# Patient Record
Sex: Female | Born: 1980
Health system: Southern US, Community
[De-identification: ages and names within clinical notes are randomized; demographics above are authoritative.]

## PROBLEM LIST (undated history)

## (undated) DIAGNOSIS — Z803 Family history of malignant neoplasm of breast: Secondary | ICD-10-CM

## (undated) DIAGNOSIS — F329 Major depressive disorder, single episode, unspecified: Secondary | ICD-10-CM

## (undated) DIAGNOSIS — C50919 Malignant neoplasm of unspecified site of unspecified female breast: Secondary | ICD-10-CM

## (undated) DIAGNOSIS — F32A Depression, unspecified: Secondary | ICD-10-CM

## (undated) DIAGNOSIS — D219 Benign neoplasm of connective and other soft tissue, unspecified: Secondary | ICD-10-CM

## (undated) DIAGNOSIS — F419 Anxiety disorder, unspecified: Secondary | ICD-10-CM

## (undated) DIAGNOSIS — Z8049 Family history of malignant neoplasm of other genital organs: Secondary | ICD-10-CM

## (undated) DIAGNOSIS — Z808 Family history of malignant neoplasm of other organs or systems: Secondary | ICD-10-CM

## (undated) DIAGNOSIS — Q793 Gastroschisis: Secondary | ICD-10-CM

## (undated) HISTORY — PX: ABDOMINAL SURGERY: SHX537

## (undated) HISTORY — DX: Family history of malignant neoplasm of other genital organs: Z80.49

## (undated) HISTORY — DX: Malignant neoplasm of unspecified site of unspecified female breast: C50.919

## (undated) HISTORY — DX: Family history of malignant neoplasm of breast: Z80.3

## (undated) HISTORY — DX: Gastroschisis: Q79.3

## (undated) HISTORY — DX: Benign neoplasm of connective and other soft tissue, unspecified: D21.9

## (undated) HISTORY — PX: ABDOMINAL HYSTERECTOMY: SHX81

## (undated) HISTORY — DX: Family history of malignant neoplasm of other organs or systems: Z80.8

## (undated) HISTORY — PX: OOPHORECTOMY: SHX86

## (undated) HISTORY — DX: Depression, unspecified: F32.A

## (undated) HISTORY — DX: Major depressive disorder, single episode, unspecified: F32.9

## (undated) HISTORY — DX: Anxiety disorder, unspecified: F41.9

---

## 2003-03-05 ENCOUNTER — Ambulatory Visit (HOSPITAL_COMMUNITY): Admission: RE | Admit: 2003-03-05 | Discharge: 2003-03-05 | Payer: Self-pay | Admitting: Neurology

## 2003-03-08 ENCOUNTER — Encounter: Payer: Self-pay | Admitting: Neurology

## 2003-03-08 ENCOUNTER — Encounter: Admission: RE | Admit: 2003-03-08 | Discharge: 2003-03-08 | Payer: Self-pay | Admitting: Neurology

## 2015-04-23 DIAGNOSIS — K589 Irritable bowel syndrome without diarrhea: Secondary | ICD-10-CM | POA: Insufficient documentation

## 2017-05-08 DIAGNOSIS — S93491A Sprain of other ligament of right ankle, initial encounter: Secondary | ICD-10-CM | POA: Insufficient documentation

## 2017-08-12 MED FILL — MONTELUKAST SOD 10 MG TAB: 10 | 90 days supply | Qty: 90 | Fill #0

## 2017-08-12 MED FILL — ZOLPIDEM TARTRATE 5 MG TAB: 5 | 30 days supply | Qty: 30 | Fill #0

## 2017-08-12 MED FILL — CITALOPRAM HBR 40 MG TABLET: 40 | 90 days supply | Qty: 90 | Fill #0

## 2017-09-21 MED FILL — ZOLPIDEM TARTRATE 5 MG TAB: 5 | 30 days supply | Qty: 30 | Fill #1

## 2017-09-28 DIAGNOSIS — H52223 Regular astigmatism, bilateral: Secondary | ICD-10-CM | POA: Diagnosis not present

## 2017-09-28 DIAGNOSIS — H5203 Hypermetropia, bilateral: Secondary | ICD-10-CM | POA: Diagnosis not present

## 2017-10-21 MED FILL — ZOLPIDEM TARTRATE 5 MG TAB: 5 | 30 days supply | Qty: 30 | Fill #2

## 2017-10-28 DIAGNOSIS — Z6830 Body mass index (BMI) 30.0-30.9, adult: Secondary | ICD-10-CM | POA: Diagnosis not present

## 2017-10-28 DIAGNOSIS — E663 Overweight: Secondary | ICD-10-CM | POA: Diagnosis not present

## 2017-11-07 DIAGNOSIS — I10 Essential (primary) hypertension: Secondary | ICD-10-CM | POA: Diagnosis not present

## 2017-11-14 DIAGNOSIS — F5104 Psychophysiologic insomnia: Secondary | ICD-10-CM | POA: Diagnosis not present

## 2017-11-14 DIAGNOSIS — K589 Irritable bowel syndrome without diarrhea: Secondary | ICD-10-CM | POA: Diagnosis not present

## 2017-11-14 DIAGNOSIS — R635 Abnormal weight gain: Secondary | ICD-10-CM | POA: Diagnosis not present

## 2017-11-14 DIAGNOSIS — F411 Generalized anxiety disorder: Secondary | ICD-10-CM | POA: Diagnosis not present

## 2017-11-18 ENCOUNTER — Encounter: Payer: Self-pay | Admitting: Certified Nurse Midwife

## 2017-11-18 ENCOUNTER — Ambulatory Visit (INDEPENDENT_AMBULATORY_CARE_PROVIDER_SITE_OTHER): Payer: 59 | Admitting: Certified Nurse Midwife

## 2017-11-18 ENCOUNTER — Other Ambulatory Visit: Payer: Self-pay

## 2017-11-18 ENCOUNTER — Other Ambulatory Visit (HOSPITAL_COMMUNITY)
Admission: RE | Admit: 2017-11-18 | Discharge: 2017-11-18 | Disposition: A | Discharge status: Home / Self Care | Payer: 59 | Source: Ambulatory Visit | Attending: Certified Nurse Midwife | Admitting: Certified Nurse Midwife

## 2017-11-18 VITALS — BP 118/80 | HR 70 | Resp 16 | Ht 64.75 in | Wt 166.0 lb

## 2017-11-18 DIAGNOSIS — Z124 Encounter for screening for malignant neoplasm of cervix: Secondary | ICD-10-CM | POA: Insufficient documentation

## 2017-11-18 DIAGNOSIS — Z01419 Encounter for gynecological examination (general) (routine) without abnormal findings: Secondary | ICD-10-CM

## 2017-11-18 DIAGNOSIS — Z0000 Encounter for general adult medical examination without abnormal findings: Secondary | ICD-10-CM | POA: Diagnosis not present

## 2017-11-18 DIAGNOSIS — E559 Vitamin D deficiency, unspecified: Secondary | ICD-10-CM | POA: Diagnosis not present

## 2017-11-18 MED FILL — MONTELUKAST SOD 10 MG TAB: 10 | 90 days supply | Qty: 90 | Fill #1

## 2017-11-18 MED FILL — CITALOPRAM HBR 40 MG TABLET: 40 | 90 days supply | Qty: 90 | Fill #1

## 2017-11-18 MED FILL — ZOLPIDEM TARTRATE 5 MG TAB: 5 | 30 days supply | Qty: 30 | Fill #3

## 2017-11-18 NOTE — Patient Instructions (Signed)

## 2017-11-18 NOTE — Progress Notes (Signed)
37 y.o. G4P1001 Married  Caucasian Fe here to establish gyn care and for annual exam. Patient had  Supra- cervical hysterectomy for fibroids. Also later had BSO due pain, no other issues per patient. Sees for asthma, insomnia, anxiety/depression management. On weight loss program now with PCP.Marland Kitchen Would like to have screening labs today also.  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 Eats well and exercises 3-5 times weekly. No other health concerns today.  No LMP recorded. Patient has had a hysterectomy.          Sexually active: Yes.    The current method of family planning is supracervical hysterectomy with BSO for fibroid Exercising: Yes.    cardio Smoker:  no  Health Maintenance: Pap:  2013 or 2014 neg per patient History of Abnormal Pap: no MMG:  none Self Breast exams: yes Colonoscopy: had done BMD:   none TDaP:  Within 10 yrs Shingles: no Pneumonia: no Hep C and HIV: not done Labs: yes   reports that  has never smoked. she has never used smokeless tobacco. She reports that she drinks alcohol. She reports that she does not use drugs.  Past Medical History:  Diagnosis Date  . Anxiety   . Depression   . Fibroid   . Gastroschisis     Past Surgical History:  Procedure Laterality Date  . ABDOMINAL HYSTERECTOMY    . ABDOMINAL SURGERY    . OOPHORECTOMY      Current Outpatient Medications  Medication Sig Dispense Refill  . citalopram (CELEXA) 40 MG tablet Take 1 tablet by mouth daily    . montelukast (SINGULAIR) 10 MG tablet as needed.    . phentermine (ADIPEX-P) 37.5 MG tablet Take 18.75 mg by mouth daily.  0  . zolpidem (AMBIEN) 5 MG tablet TAKE ONE TABLET BY MOUTH AT BEDTIME     No current facility-administered medications for this visit.     Family History  Problem Relation Age of Onset  . Uterine cancer Mother   . Diabetes Mother   . Hypertension Mother   . Thyroid disease Mother   . Skin cancer Father   . Diabetes Maternal Grandmother   . Hypertension  Maternal Grandmother   . Diabetes Paternal Grandfather     ROS:  Pertinent items are noted in HPI.  Otherwise, a comprehensive ROS was negative.  Exam:   BP 118/80   Pulse 70   Resp 16   Ht 5' 4.75" (1.645 m)   Wt 166 lb (75.3 kg)   BMI 27.84 kg/m  Height: 5' 4.75" (164.5 cm) Ht Readings from Last 3 Encounters:  11/18/17 5' 4.75" (1.645 m)    General appearance: alert, cooperative and appears stated age Head: Normocephalic, without obvious abnormality, atraumatic Neck: no adenopathy, supple, symmetrical, trachea midline and thyroid normal to inspection and palpation Lungs: clear to auscultation bilaterally Breasts: normal appearance, no masses or tenderness, No nipple retraction or dimpling, No nipple discharge or bleeding, No axillary or supraclavicular adenopathy Heart: regular rate and rhythm Abdomen: soft, non-tender; no masses,  no organomegaly, numerous scarring for multiple abdominal surgery Extremities: extremities normal, atraumatic, no cyanosis or edema Skin: Skin color, texture, turgor normal. No rashes or lesions Lymph nodes: Cervical, supraclavicular, and axillary nodes normal. No abnormal inguinal nodes palpated Neurologic: Grossly normal   Pelvic: External genitalia:  no lesions              Urethra:  normal appearing urethra with no masses, tenderness or lesions  Bartholin's and Skene's: normal                 Vagina: normal appearing vagina with normal color and discharge, no lesions              Cervix: multiparous appearance, no cervical motion tenderness and no lesions              Pap taken: Yes.   Bimanual Exam:  Uterus:  uterus absent              Adnexa: no mass, fullness, tenderness and ovaries surgically absent               Rectovaginal: Confirms               Anus:  normal sphincter tone, no lesions  Chaperone present: yes  A:  Well Woman with normal exam  Contraception TAH supracervical for fibroids, BSO due to pain,    Gastroschisis surgery as an infant  Anxiety/depression/asthma,insomnia management with PCP  Screening labs  P:   Reviewed health and wellness pertinent to exam  Discussed vaginal changes with just cervix present, questions addressed.  Continue follow with MD as indicate  Labs: CMP,Lipid panel,Vitamin D, TSH,CBC  Pap smear: yes   counseled on breast self exam, mammography screening, feminine hygiene, adequate intake of calcium and vitamin D, diet and exercise  return annually or prn  An After Visit Summary was printed and given to the patient.

## 2017-11-19 LAB — VITAMIN D 25 HYDROXY (VIT D DEFICIENCY, FRACTURES): VIT D 25 HYDROXY: 49.5 ng/mL (ref 30.0–100.0)

## 2017-11-19 LAB — COMPREHENSIVE METABOLIC PANEL
ALBUMIN: 4.7 g/dL (ref 3.5–5.5)
ALK PHOS: 65 IU/L (ref 39–117)
ALT: 32 IU/L (ref 0–32)
AST: 24 IU/L (ref 0–40)
Albumin/Globulin Ratio: 2.4 — ABNORMAL HIGH (ref 1.2–2.2)
BUN / CREAT RATIO: 11 (ref 9–23)
BUN: 7 mg/dL (ref 6–20)
Bilirubin Total: 0.6 mg/dL (ref 0.0–1.2)
CHLORIDE: 98 mmol/L (ref 96–106)
CO2: 24 mmol/L (ref 20–29)
Calcium: 9.2 mg/dL (ref 8.7–10.2)
Creatinine, Ser: 0.63 mg/dL (ref 0.57–1.00)
GFR calc Af Amer: 132 mL/min/{1.73_m2} (ref >59)
GFR calc non Af Amer: 114 mL/min/{1.73_m2} (ref >59)
GLUCOSE: 89 mg/dL (ref 65–99)
Globulin, Total: 2 g/dL (ref 1.5–4.5)
Potassium: 4.1 mmol/L (ref 3.5–5.2)
Sodium: 140 mmol/L (ref 134–144)
Total Protein: 6.7 g/dL (ref 6.0–8.5)

## 2017-11-19 LAB — CBC
HEMATOCRIT: 38 % (ref 34.0–46.6)
HEMOGLOBIN: 13.2 g/dL (ref 11.1–15.9)
MCH: 28.8 pg (ref 26.6–33.0)
MCHC: 34.7 g/dL (ref 31.5–35.7)
MCV: 83 fL (ref 79–97)
Platelets: 251 10*3/uL (ref 150–379)
RBC: 4.59 x10E6/uL (ref 3.77–5.28)
RDW: 13.3 % (ref 12.3–15.4)
WBC: 6.1 10*3/uL (ref 3.4–10.8)

## 2017-11-19 LAB — LIPID PANEL
Chol/HDL Ratio: 3.4 ratio (ref 0.0–4.4)
Cholesterol, Total: 178 mg/dL (ref 100–199)
HDL: 52 mg/dL (ref >39)
LDL Calculated: 109 mg/dL — ABNORMAL HIGH (ref 0–99)
Triglycerides: 83 mg/dL (ref 0–149)
VLDL Cholesterol Cal: 17 mg/dL (ref 5–40)

## 2017-11-19 LAB — TSH: TSH: 0.695 u[IU]/mL (ref 0.450–4.500)

## 2017-11-23 DIAGNOSIS — J180 Bronchopneumonia, unspecified organism: Secondary | ICD-10-CM | POA: Diagnosis not present

## 2017-11-23 DIAGNOSIS — J018 Other acute sinusitis: Secondary | ICD-10-CM | POA: Diagnosis not present

## 2017-11-23 LAB — CYTOLOGY - PAP
Diagnosis: NEGATIVE
HPV: NOT DETECTED

## 2017-12-14 MED FILL — SAXENDA 18 MG/3 ML PEN: 18 | 30 days supply | Qty: 15 | Fill #0

## 2017-12-14 MED FILL — UNIFINE PENTIPS 31GX3/16": 31G X 5 MM | 90 days supply | Qty: 100 | Fill #0

## 2017-12-14 MED FILL — UNIFINE PENTIPS 31GX3/16: 31G X 5 MM | 90 days supply | Qty: 100 | Fill #0

## 2017-12-20 MED FILL — ZOLPIDEM TARTRATE 5 MG TAB: 5 | 30 days supply | Qty: 30 | Fill #0

## 2018-01-23 MED FILL — ZOLPIDEM TARTRATE 5 MG TAB: 5 | 30 days supply | Qty: 30 | Fill #1

## 2018-01-23 MED FILL — SAXENDA 18 MG/3 ML PEN: 18 | 30 days supply | Qty: 15 | Fill #1

## 2018-03-07 MED FILL — SAXENDA 18 MG/3 ML PEN: 18 | 30 days supply | Qty: 15 | Fill #2

## 2018-03-07 MED FILL — MONTELUKAST SOD 10 MG TAB: 10 | 90 days supply | Qty: 90 | Fill #2

## 2018-03-07 MED FILL — CITALOPRAM HBR 40 MG TABLET: 40 | 90 days supply | Qty: 90 | Fill #2

## 2018-03-07 MED FILL — ZOLPIDEM TARTRATE 5 MG TAB: 5 | 30 days supply | Qty: 30 | Fill #2

## 2018-05-15 DIAGNOSIS — F5104 Psychophysiologic insomnia: Secondary | ICD-10-CM | POA: Diagnosis not present

## 2018-05-15 DIAGNOSIS — F418 Other specified anxiety disorders: Secondary | ICD-10-CM | POA: Diagnosis not present

## 2018-05-19 DIAGNOSIS — H903 Sensorineural hearing loss, bilateral: Secondary | ICD-10-CM | POA: Diagnosis not present

## 2018-05-19 DIAGNOSIS — H9313 Tinnitus, bilateral: Secondary | ICD-10-CM | POA: Diagnosis not present

## 2018-05-19 MED FILL — CYCLOBENZAPRINE 10 MG TAB: 10 | 30 days supply | Qty: 60 | Fill #0

## 2018-06-19 DIAGNOSIS — Z6831 Body mass index (BMI) 31.0-31.9, adult: Secondary | ICD-10-CM | POA: Diagnosis not present

## 2018-06-19 DIAGNOSIS — F418 Other specified anxiety disorders: Secondary | ICD-10-CM | POA: Diagnosis not present

## 2018-08-30 DIAGNOSIS — R202 Paresthesia of skin: Secondary | ICD-10-CM | POA: Diagnosis not present

## 2018-08-30 DIAGNOSIS — R5383 Other fatigue: Secondary | ICD-10-CM | POA: Diagnosis not present

## 2018-08-30 DIAGNOSIS — G4733 Obstructive sleep apnea (adult) (pediatric): Secondary | ICD-10-CM | POA: Diagnosis not present

## 2018-08-30 DIAGNOSIS — F331 Major depressive disorder, recurrent, moderate: Secondary | ICD-10-CM | POA: Diagnosis not present

## 2018-09-25 DIAGNOSIS — R202 Paresthesia of skin: Secondary | ICD-10-CM | POA: Diagnosis not present

## 2018-09-25 DIAGNOSIS — G4733 Obstructive sleep apnea (adult) (pediatric): Secondary | ICD-10-CM | POA: Diagnosis not present

## 2018-09-25 DIAGNOSIS — F331 Major depressive disorder, recurrent, moderate: Secondary | ICD-10-CM | POA: Diagnosis not present

## 2018-10-03 DIAGNOSIS — G4733 Obstructive sleep apnea (adult) (pediatric): Secondary | ICD-10-CM | POA: Diagnosis not present

## 2018-10-17 ENCOUNTER — Encounter (INDEPENDENT_AMBULATORY_CARE_PROVIDER_SITE_OTHER): Payer: No Typology Code available for payment source | Admitting: Neurology

## 2018-10-17 ENCOUNTER — Ambulatory Visit: Payer: 59 | Admitting: Neurology

## 2018-10-17 DIAGNOSIS — R200 Anesthesia of skin: Secondary | ICD-10-CM

## 2018-10-17 DIAGNOSIS — R29898 Other symptoms and signs involving the musculoskeletal system: Secondary | ICD-10-CM

## 2018-10-17 DIAGNOSIS — Z0289 Encounter for other administrative examinations: Secondary | ICD-10-CM

## 2018-10-17 HISTORY — DX: Anesthesia of skin: R20.0

## 2018-10-17 HISTORY — DX: Other symptoms and signs involving the musculoskeletal system: R29.898

## 2018-10-17 NOTE — Progress Notes (Signed)
Full Name: Sheri Moore Gender: Female MRN #: [id number] Date of Birth: 1980-10-12    Visit Date: 10/17/2018 09:04 Age: 38 Years 26 Months Old Examining Physician: Arlice Colt, MD  Referring Physician: Rochel Brome, MD     History:  Sheri Moore is a 38 year old woman with a four-month history left arm numbness.  Sometimes numbness is in the entire arm and sometimes it is more to the fourth and fifth fingers.  She also notes weakness especially if she holds up an item for a while, such as a gallon of milk.  Of note, she reports that about 10 years ago she was diagnosed with possible MS based on symptoms and an MRI but was never definitively diagnosed or placed on a disease modifying therapy.  On examination today, she has a Tinel sign at the left elbow going to the fourth and fifth fingers.  Strength was 4+/5 in the triceps, pronator muscles and finger extensors.  Strength was 5/5 elsewhere.  Sensation and reflexes were symmetric.  Nerve conduction studies: The left ulnar motor response was mildly slowed across the elbow with a slight drop in amplitude.  The right ulnar and both median motor responses were normal.  The ulnar F-wave responses were normal.  Bilateral median and ulnar sensory responses had normal peak latencies and amplitudes.  Electromyography:  Needle EMG of select muscles of the left arm and hand was performed.  Motor unit morphology and recruitment was normal in all of the muscles tested.  There was no abnormal spontaneous activity.  Impression: This NCV/EMG study shows the following: 1.   Minimal left ulnar neuropathy across the wrist. 2.   There is no definite evidence of radiculopathy though mild radiculopathies cannot always be detected by EMG.  Sheri Moore A. Sheri Shelling, MD, PhD, FAAN Certified in Neurology, Clinical Neurophysiology, Sleep Medicine, Pain Medicine and Neuroimaging Director, [place] at [facility] [address] [place], [place] [address] [phone]  _________________________________________________________________        [facility]    Nerve / Sites Muscle Latency Ref. Amplitude Ref. Rel Amp Segments Distance Velocity Ref. Area    ms ms mV mV %  cm m/s m/s mVms  R Median - APB     Wrist APB 2.9 ?4.4 9.9 ?4.0 100 Wrist - APB 7   37.3     Upper arm APB 6.6  9.8  99.4 Upper arm - Wrist 21 56 ?49 35.9  L Median - APB     Wrist APB 2.9 ?4.4 11.2 ?4.0 100 Wrist - APB 7   43.9     Upper arm APB 6.8  10.5  93.1 Upper arm - Wrist 21 54 ?49 39.5  R Ulnar - ADM     Wrist ADM 2.7 ?3.3 9.4 ?6.0 100 Wrist - ADM 7   32.3     B.Elbow ADM 5.5  9.3  98.8 B.Elbow - Wrist 17 60 ?49 34.3     A.Elbow ADM 7.5  9.3  100 A.Elbow - B.Elbow 10 49 ?49 35.3         A.Elbow - Wrist      L Ulnar - ADM     Wrist ADM 2.8 ?3.3 9.6 ?6.0 100 Wrist - ADM 7   44.0     B.Elbow ADM 6.2  9.0  93.3 B.Elbow - Wrist 17 49 ?49 43.6     A.Elbow ADM 8.5  7.4  82  A.Elbow - B.Elbow 10 44 ?49 35.8         A.Elbow - Wrist                 SNC    Nerve / Sites Rec. Site Peak Lat Ref.  Amp Ref. Segments Distance    ms ms V V  cm  R Median - Orthodromic (Dig II, Mid palm)     Dig II Wrist 3.2 ?3.4 22 ?10 Dig II - Wrist 13  L Median - Orthodromic (Dig II, Mid palm)     Dig II Wrist 3.3 ?3.4 23 ?10 Dig II - Wrist 13  R Ulnar - Orthodromic, (Dig V, Mid palm)     Dig V Wrist 3.0 ?3.1 6 ?5 Dig V - Wrist 11  L Ulnar - Orthodromic, (Dig V, Mid palm)     Dig V Wrist 3.1 ?3.1 7 ?5 Dig V - Wrist 21              F  Wave    Nerve F Lat Ref.   ms ms  R Ulnar - ADM 27.6 ?32.0  L Ulnar - ADM 28.6 ?32.0         EMG full       EMG Summary Table    Spontaneous MUAP Recruitment  Muscle IA Fib PSW Fasc Other Amp Dur. Poly Pattern  L. Deltoid Normal None None None _______ Normal Normal Normal Normal  L. Triceps brachii Normal None None None _______ Normal Normal Normal Normal   L. Biceps brachii Normal None None None _______ Normal Normal Normal Normal  L. Extensor digitorum communis Normal None None None _______ Normal Normal Normal Normal  L. Flexor carpi ulnaris Normal None None None _______ Normal Normal Normal Normal  L. First dorsal interosseous Normal None None None _______ Normal Normal Normal Normal

## 2018-10-18 ENCOUNTER — Other Ambulatory Visit: Payer: Self-pay | Admitting: Family Medicine

## 2018-10-18 DIAGNOSIS — R200 Anesthesia of skin: Secondary | ICD-10-CM

## 2018-10-18 DIAGNOSIS — R202 Paresthesia of skin: Principal | ICD-10-CM

## 2018-10-22 ENCOUNTER — Other Ambulatory Visit: Payer: 59

## 2018-10-27 ENCOUNTER — Ambulatory Visit
Admission: RE | Admit: 2018-10-27 | Discharge: 2018-10-27 | Disposition: A | Discharge status: Home / Self Care | Payer: No Typology Code available for payment source | Source: Ambulatory Visit | Attending: Family Medicine | Admitting: Family Medicine

## 2018-10-27 DIAGNOSIS — R200 Anesthesia of skin: Secondary | ICD-10-CM

## 2018-10-27 DIAGNOSIS — R202 Paresthesia of skin: Principal | ICD-10-CM

## 2018-10-27 MED ORDER — GADOBENATE DIMEGLUMINE 529 MG/ML IV SOLN
15.0000 mL | Freq: Once | INTRAVENOUS | Status: AC | PRN
  Administered 2018-10-27: 15 mL via INTRAVENOUS

## 2018-10-30 DIAGNOSIS — M6281 Muscle weakness (generalized): Secondary | ICD-10-CM | POA: Diagnosis not present

## 2018-10-30 DIAGNOSIS — R202 Paresthesia of skin: Secondary | ICD-10-CM | POA: Diagnosis not present

## 2018-10-30 DIAGNOSIS — R9402 Abnormal brain scan: Secondary | ICD-10-CM | POA: Diagnosis not present

## 2018-10-30 DIAGNOSIS — F320 Major depressive disorder, single episode, mild: Secondary | ICD-10-CM | POA: Diagnosis not present

## 2018-11-09 ENCOUNTER — Encounter: Payer: Self-pay | Admitting: Neurology

## 2018-11-09 ENCOUNTER — Ambulatory Visit: Payer: No Typology Code available for payment source | Admitting: Neurology

## 2018-11-09 ENCOUNTER — Other Ambulatory Visit: Payer: Self-pay

## 2018-11-09 VITALS — BP 126/90 | HR 70 | Ht 65.0 in | Wt 162.5 lb

## 2018-11-09 DIAGNOSIS — R200 Anesthesia of skin: Secondary | ICD-10-CM | POA: Diagnosis not present

## 2018-11-09 DIAGNOSIS — F418 Other specified anxiety disorders: Secondary | ICD-10-CM | POA: Insufficient documentation

## 2018-11-09 DIAGNOSIS — R9082 White matter disease, unspecified: Secondary | ICD-10-CM | POA: Insufficient documentation

## 2018-11-09 DIAGNOSIS — R5383 Other fatigue: Secondary | ICD-10-CM

## 2018-11-09 MED FILL — TRINTELLIX 20 MG TABLET: 20 | 90 days supply | Qty: 90 | Fill #0

## 2018-11-09 NOTE — Progress Notes (Signed)
[facility]  PATIENT: Sheri Moore DOB: 16-Feb-1981  REFERRING DOCTOR OR PCP: Sheri Brome, MD SOURCE: Patient, notes from Dr. Tobie Moore, notes from Dr. Jacqulynn Moore (weight for his MS center), imaging, study and lab reports, multiple MRI images personally reviewed  _________________________________   HISTORICAL  CHIEF COMPLAINT:  Chief Complaint  Patient presents with  . New Patient (Initial Visit)    RM 45 with husband. Paper referral from Dr. Tobie Moore for MS. Dr. Dellis Moore previously followed her back in 2009 when she had her first MRI for probable MS. Had recent MRI 10/27/18 at [facility] imaging. Can be viewed in EPIC. Shows probable MS, no active lesions.   . Numbness    Having numbness in lips,face, tingling in left arm, fourth and fifth fingers on left hand, tongue and throat.   . Fatigue    Having decreased strength and muscle fatigue in left arm. Fatigue in excessive heat.   . Cognition changes    Unable to focus, unable to remember things, words "get lost between brain and mouth" per pt. She will lose her train of thought mid-sentence.   Marland Kitchen RLS    Reports occasional RLS at night  . Other    Pt also reports constipation, vertigo, moderate depression    HISTORY OF PRESENT ILLNESS:  I had the pleasure seeing your patient, Sheri Moore, at [facility] for neurologic consultation regarding her numbness, fatigue, cognitive changes and abnormal brain MRI.  Sheri Moore is a 38 year old woman who began to experience vertigo and headaches about 2004.    She had a brain MRI performedshowing white matter abnormalities and saw Dr. Jannifer Moore.   An LP was performed and CSF was normal in 2004.    She had a post-LP headache and a blood patch was needed (03/11/2003).  She was referred to Dr. Jacqulynn Moore.       At some point in time a second LP was performed and it was also negative.    She was seen by Dr. Dellis Moore at [facility] between 2006 and 2009 and had a couple of MRI scans  performed.  Besides the vertigo, she also reported a mental fog around that time with reduced focus and attention.   She was not definitively diagnosed with multiple sclerosis.  Around October 2019, she had the onset of lip tingling bilaterally and then mor eof the face and the left arm had tingling.     The arm tingling is constant while the facial tingling is intermittent.   The right arm is sometimes feeling heavy but no tingling/numbness.   Legs are fine.   Her bladder and bowel function are unchanged.   Vision acuity is fine with glasses.   She has symmetric color vision.  She has borderline elevated IOP.      She has had fatigue x many years, worse in the heat (like at ball games) and will sometimes feel too tired to do more activities later in the day.    She is falling asleep most nights and gets 8 hours of sleep most nights.  She has both depression and anxiety that worsened last year.   She was recently switched to Trintellix from Cymbalta and citalopram.     She feels memory is poor.  Focus and attention is poor.   She did not have ADD signs/symptoms as a child or teen.   She feels the cognitive issues have progressed further over the last month.      She has occasionally  headaches, mostly mild to moderate and helped by Tylenol.  Only a few have nausea.   No associated neurologic symptoms or aura.    Last year she had a home sleep study.  By her report, it was normal..  Weight has not changed since that time.  DATA Personally reviewed:  MRI of the brain 10/27/2018 was personally reviewed and compared to a study 11/15/2007 and 02/25/2005.  There are multiple T2/flair hyperintense foci predominantly in the subcortical white matter.  Some IN the deep white matter and a couple are periventricular though none are in the callosal septal fibers.   Compared to MRIs from 11/15/2007 another MRI from 2007, there has been some progression over the last 10 years.  No foci are noted in the brainstem or  cerebellum.  SSEP, VEP 07/05/2003 was normal  Labs 06/10/2003 and 06/12/03:  Anti cardiolipin, Prot C, Prot S, Facotr V Lieden, TPA, AT III, Lyme, ANA, B12, ESR  Notes from Dr. Jacqulynn Moore (and Sheri Moore) 06/10/2003, 07/09/2003, 02/14/2004, 10/02/2004, 04/05/2005, 8/3/20061/30/2007, 04/04/2006, 05/23/2006, 10/25/2007  EMG/NCV 10/17/2018 Impression: This NCV/EMG study shows the following: 1.   Minimal left ulnar neuropathy across the wrist. 2.   There is no definite evidence of radiculopathy though mild radiculopathies cannot always be detected by EMG.  Her grandfather had Wegener's disease and an MI.  Her maternal grandmother had a stroke  REVIEW OF SYSTEMS: Constitutional: No fevers, chills, sweats, or change in appetite.  She has fatigue.  She has restless leg syndrome. Eyes: No visual changes, double vision, eye pain Ear, nose and throat: No hearing loss, ear pain, nasal congestion, sore throat Cardiovascular: No chest pain, palpitations Respiratory: No shortness of breath at rest or with exertion.   No wheezes GastrointestinaI: No nausea, vomiting, diarrhea, abdominal pain, fecal incontinence Genitourinary: No dysuria, urinary retention or frequency.  No nocturia. Musculoskeletal: No neck pain, back pain Integumentary: No rash, pruritus, skin lesions Neurological: as above Psychiatric: No depression at this time.  No anxiety Endocrine: No palpitations, diaphoresis, change in appetite, change in weigh or increased thirst Hematologic/Lymphatic: No anemia, purpura, petechiae. Allergic/Immunologic: No itchy/runny eyes, nasal congestion, recent allergic reactions, rashes  ALLERGIES: No Known Allergies  HOME MEDICATIONS:  Current Outpatient Medications:  .  montelukast (SINGULAIR) 10 MG tablet, as needed., Disp: , Rfl:  .  vortioxetine HBr (TRINTELLIX) 20 MG TABS tablet, Take 20 mg by mouth daily., Disp: , Rfl:  .  zolpidem (AMBIEN) 5 MG tablet, Take 5 mg by mouth as needed. , Disp: , Rfl:    PAST MEDICAL HISTORY: Past Medical History:  Diagnosis Date  . Anxiety   . Depression   . Fibroid   . Gastroschisis     PAST SURGICAL HISTORY: Past Surgical History:  Procedure Laterality Date  . ABDOMINAL HYSTERECTOMY     supracervical hysterectomy  . ABDOMINAL SURGERY    . OOPHORECTOMY      FAMILY HISTORY: Family History  Problem Relation Age of Onset  . Uterine cancer Mother   . Diabetes Mother   . Hypertension Mother   . Thyroid disease Mother   . Skin cancer Father   . Diabetes Maternal Grandmother   . Hypertension Maternal Grandmother   . Diabetes Paternal Grandfather     SOCIAL HISTORY:  Social History   Socioeconomic History  . Marital status: Married    Spouse name: Not on file  . Number of children: 1  . Years of education: College  . Highest education level: Not on file  Occupational  History  . Occupation: [employer]  Social Needs  . Financial resource strain: Not on file  . Food insecurity:    Worry: Not on file    Inability: Not on file  . Transportation needs:    Medical: Not on file    Non-medical: Not on file  Tobacco Use  . Smoking status: Former Smoker    Last attempt to quit: 10/11/2001    Years since quitting: 17.0  . Smokeless tobacco: Never Used  Substance and Sexual Activity  . Alcohol use: Yes    Comment: 1-2 a month  . Drug use: No  . Sexual activity: Yes    Partners: Female    Birth control/protection: Surgical    Comment: supracervical hysterectomy  Lifestyle  . Physical activity:    Days per week: Not on file    Minutes per session: Not on file  . Stress: Not on file  Relationships  . Social connections:    Talks on phone: Not on file    Gets together: Not on file    Attends religious service: Not on file    Active member of club or organization: Not on file    Attends meetings of clubs or organizations: Not on file    Relationship status: Not on file  . Intimate partner violence:    Fear of current  or ex partner: Not on file    Emotionally abused: Not on file    Physically abused: Not on file    Forced sexual activity: Not on file  Other Topics Concern  . Not on file  Social History Narrative   Lives with husband and son   Caffeine use: rarely   Right handed      PHYSICAL EXAM  Vitals:   11/09/18 0902  BP: 126/90  Pulse: 70  Weight: 162 lb 8 oz (73.7 kg)  Height: _0  (1.651 m)    Body mass index is 27.04 kg/m.   General: The patient is well-developed and well-nourished and in no acute distress  Eyes:  Funduscopic exam shows normal optic discs and retinal vessels.  Neck: The neck is supple, no carotid bruits are noted.  The neck is nontender.  Cardiovascular: The heart has a regular rate and rhythm with a normal S1 and S2. There were no murmurs, gallops or rubs. Lungs are clear to auscultation.  Skin: Extremities are without significant edema.  Musculoskeletal:  Back is nontender  Neurologic Exam  Mental status: The patient is alert and oriented x 3 at the time of the examination. The patient has apparent normal recent and remote memory, with an apparently normal attention span and concentration ability.   Speech is normal.  Cranial nerves: Extraocular movements are full. Pupils are equal, round, and reactive to light and accomodation.  Visual fields are full.  Facial symmetry is present. There is reduced left facial sensation to temperature.  .Facial strength is normal.  Trapezius and sternocleidomastoid strength is normal. No dysarthria is noted.  The tongue is midline, and the patient has symmetric elevation of the soft palate. No obvious hearing deficits are noted.  Motor:  Muscle bulk is normal.   Tone is normal. Strength is  5 / 5 in all 4 extremities.   Sensory: Sensory testing shows reduced touch sensation at left hypothenar eminence.   Mild asymmetry to temperature sensation in arms, colder on the left.    Coordination: Cerebellar testing reveals good  finger-nose-finger and heel-to-shin bilaterally.  Gait and station: Station  is normal.   Gait is normal. Tandem gait is normal. Romberg is negative.   Reflexes: Deep tendon reflexes are symmetric and normal bilaterally.   Plantar responses are flexor.    DIAGNOSTIC DATA (LABS, IMAGING, TESTING) - I reviewed patient records, labs, notes, testing and imaging myself where available.  Lab Results  Component Value Date   WBC 6.1 11/18/2017   HGB 13.2 11/18/2017   HCT 38.0 11/18/2017   MCV 83 11/18/2017   PLT 251 11/18/2017      Component Value Date/Time   NA 140 11/18/2017 1702   K 4.1 11/18/2017 1702   CL 98 11/18/2017 1702   CO2 24 11/18/2017 1702   GLUCOSE 89 11/18/2017 1702   BUN 7 11/18/2017 1702   CREATININE 0.63 11/18/2017 1702   CALCIUM 9.2 11/18/2017 1702   PROT 6.7 11/18/2017 1702   ALBUMIN 4.7 11/18/2017 1702   AST 24 11/18/2017 1702   ALT 32 11/18/2017 1702   ALKPHOS 65 11/18/2017 1702   BILITOT 0.6 11/18/2017 1702   GFRNONAA 114 11/18/2017 1702   GFRAA 132 11/18/2017 1702   Lab Results  Component Value Date   CHOL 178 11/18/2017   HDL 52 11/18/2017   LDLCALC 109 (H) 11/18/2017   TRIG 83 11/18/2017   CHOLHDL 3.4 11/18/2017   No results found for: HGBA1C No results found for: VITAMINB12 Lab Results  Component Value Date   TSH 0.695 11/18/2017       ASSESSMENT AND PLAN  Left arm numbness - Plan: MR CERVICAL SPINE WO CONTRAST, ECHOCARDIOGRAM COMPLETE BUBBLE STUDY, Pan-ANCA, Sedimentation rate  White matter abnormality on MRI of brain - Plan: MR CERVICAL SPINE WO CONTRAST, ECHOCARDIOGRAM COMPLETE BUBBLE STUDY, Pan-ANCA, Sedimentation rate  Other fatigue - Plan: Pan-ANCA, Sedimentation rate  Depression with anxiety   In summary, Ms. Lisby is a 38 year old woman with facial and left arm numbness who has multiple T2/flair hyperintense lesions on her brain MRI showing some progression compared to a 2009 MRI.  She also has depression/anxiety, fatigue  and decreased focus/attention.    I had a long conversation with her and her husband about the significance of the MRI findings and her symptoms.  The foci on MRI or more consistent with chronic microvascular ischemic change, sequela of migraine or sequela of cardio-emboli.  MS is less likely.  Therefore, we need to check some additional blood work including ESR and ANCA antibodies and also check an echocardiogram with bubble contrast to determine if there is any right to left shunting through a PFO or ASD or valvular abnormalities.  Additionally, we need to check an MRI of the cervical spine to better assess the persistent left arm numbness.  If she does have foci consistent with demyelination in the cervical spine, the possibility of MS is increased.  I will see her back in 6 weeks to go over those results.  If symptoms have not changed and the evaluation is negative then etiology would be uncertain but we could consider further treatment of symptoms.  Thank you for asking me to see Ms. Hakes for neurologic consultation.  Please let me know if I can be of further assistance with her or other patients in the future.   Richard A. Felecia Shelling, MD, PhD, FAAN Certified in Neurology, Clinical Neurophysiology, Sleep Medicine, Pain Medicine and Neuroimaging Director, [place] at [facility] [address] [place], [place] [address] [phone]

## 2018-11-12 LAB — PAN-ANCA
ANCA Proteinase 3: <3.5 U/mL (ref 0.0–3.5)
Atypical pANCA: <1:20 {titer}
C-ANCA: <1:20 {titer}
Myeloperoxidase Ab: <9 U/mL (ref 0.0–9.0)

## 2018-11-12 LAB — SEDIMENTATION RATE: Sed Rate: 2 mm/hr (ref 0–32)

## 2018-11-13 ENCOUNTER — Telehealth: Payer: Self-pay | Admitting: Neurology

## 2018-11-13 ENCOUNTER — Telehealth: Payer: Self-pay | Admitting: *Deleted

## 2018-11-13 NOTE — Telephone Encounter (Signed)
-----   Message from Britt Bottom, MD sent at 11/12/2018 10:29 AM EST ----- Please let the patient know that the lab work is fine.   We will let her know the results of the echo and MRI after they are performed.

## 2018-11-13 NOTE — Telephone Encounter (Signed)
I called and spoke with patient. Relayed results per Dr. Felecia Shelling note. She verbalized understanding.

## 2018-11-13 NOTE — Telephone Encounter (Signed)
Patient returned my call she is scheduled for 11/15/18 at [facility].   [facility] Josem Kaufmann: [phone] (exp. 11/15/18 to 12/15/18)

## 2018-11-13 NOTE — Telephone Encounter (Signed)
lvm for pt to call me back about schedulng mri  do auth once scheduled

## 2018-11-15 ENCOUNTER — Ambulatory Visit: Payer: No Typology Code available for payment source

## 2018-11-15 DIAGNOSIS — R200 Anesthesia of skin: Secondary | ICD-10-CM

## 2018-11-15 DIAGNOSIS — R9082 White matter disease, unspecified: Secondary | ICD-10-CM | POA: Diagnosis not present

## 2018-11-16 ENCOUNTER — Telehealth: Payer: Self-pay | Admitting: *Deleted

## 2018-11-16 NOTE — Telephone Encounter (Signed)
-----   Message from Britt Bottom, MD sent at 11/15/2018  5:45 PM EST ----- Please let her know that the MRI of the cervical spine was normal.

## 2018-11-16 NOTE — Telephone Encounter (Signed)
Sent pt mychart message about results.

## 2018-11-17 ENCOUNTER — Ambulatory Visit (HOSPITAL_COMMUNITY)
Admission: RE | Admit: 2018-11-17 | Discharge: 2018-11-17 | Disposition: A | Discharge status: Home / Self Care | Payer: No Typology Code available for payment source | Source: Ambulatory Visit | Attending: Neurology | Admitting: Neurology

## 2018-11-17 DIAGNOSIS — Q211 Atrial septal defect: Secondary | ICD-10-CM | POA: Diagnosis not present

## 2018-11-17 DIAGNOSIS — R200 Anesthesia of skin: Secondary | ICD-10-CM

## 2018-11-17 DIAGNOSIS — R9082 White matter disease, unspecified: Secondary | ICD-10-CM | POA: Insufficient documentation

## 2018-11-17 NOTE — Progress Notes (Signed)
  Echocardiogram 2D Echocardiogram has been performed.  Sheri Moore 11/17/2018, 9:01 AM

## 2018-11-20 ENCOUNTER — Telehealth: Payer: Self-pay | Admitting: *Deleted

## 2018-11-20 NOTE — Telephone Encounter (Signed)
-----   Message from Britt Bottom, MD sent at 11/17/2018  3:50 PM EST ----- Please let her know that the echocardiogram was normal.

## 2018-11-22 ENCOUNTER — Other Ambulatory Visit: Payer: Self-pay | Admitting: *Deleted

## 2018-11-22 MED ORDER — ARMODAFINIL 200 MG PO TABS: 1.0000 | ORAL_TABLET | ORAL | 3 refills | Status: DC

## 2018-11-22 MED FILL — ARMODAFINIL 200 MG TABLET: 200 | 30 days supply | Qty: 30 | Fill #0

## 2018-11-24 ENCOUNTER — Other Ambulatory Visit: Payer: Self-pay

## 2018-11-24 ENCOUNTER — Encounter: Payer: Self-pay | Admitting: Certified Nurse Midwife

## 2018-11-24 ENCOUNTER — Ambulatory Visit (INDEPENDENT_AMBULATORY_CARE_PROVIDER_SITE_OTHER): Payer: No Typology Code available for payment source | Admitting: Certified Nurse Midwife

## 2018-11-24 VITALS — BP 120/80 | HR 68 | Resp 16 | Ht 64.75 in | Wt 162.0 lb

## 2018-11-24 DIAGNOSIS — R634 Abnormal weight loss: Secondary | ICD-10-CM

## 2018-11-24 DIAGNOSIS — E559 Vitamin D deficiency, unspecified: Secondary | ICD-10-CM | POA: Diagnosis not present

## 2018-11-24 DIAGNOSIS — Z01419 Encounter for gynecological examination (general) (routine) without abnormal findings: Secondary | ICD-10-CM

## 2018-11-24 DIAGNOSIS — Z0000 Encounter for general adult medical examination without abnormal findings: Secondary | ICD-10-CM | POA: Diagnosis not present

## 2018-11-24 NOTE — Patient Instructions (Signed)

## 2018-11-24 NOTE — Progress Notes (Signed)
38 y.o. [id number] Married  Caucasian Fe here for annual exam. Some occasional hot flashes, no issues. Some vaginal dryness, but lubrication works well. Seeing Dr. Kerman Passey for arm and facial numbness. All work ups negative to this point.  Sees PCP for medication management and labs if needed. Has been working on weight loss for better health. No health issues today.  No LMP recorded. Patient has had a hysterectomy.          Sexually active: Yes.    The current method of family planning is status post hysterectomy.   supracervical Exercising: Yes.    walking Smoker:  no  Review of Systems  Constitutional: Negative.   HENT: Negative.   Eyes: Negative.   Respiratory: Negative.   Cardiovascular: Negative.   Gastrointestinal: Negative.   Genitourinary: Negative.   Musculoskeletal:       Muscle weakness, difficulty with memory or speech  Skin: Negative.   Neurological: Negative.   Endo/Heme/Allergies: Negative.   Psychiatric/Behavioral: Positive for depression.    Health Maintenance: Pap:  11-18-17 neg HPV HR neg History of Abnormal Pap: no MMG:  none Self Breast exams: occ Colonoscopy:  long time ago BMD:   none TDaP:  2013 Shingles: no Pneumonia: no Hep C and HIV: not done Labs: if needed   reports that she quit smoking about 17 years ago. She has never used smokeless tobacco. She reports current alcohol use. She reports that she does not use drugs.  Past Medical History:  Diagnosis Date  . Anxiety   . Depression   . Fibroid   . Gastroschisis     Past Surgical History:  Procedure Laterality Date  . ABDOMINAL HYSTERECTOMY     supracervical hysterectomy  . ABDOMINAL SURGERY    . OOPHORECTOMY      Current Outpatient Medications  Medication Sig Dispense Refill  . Armodafinil 200 MG TABS Take 1 tablet by mouth every morning. 30 tablet 3  . montelukast (SINGULAIR) 10 MG tablet as needed.    . vortioxetine HBr (TRINTELLIX) 20 MG TABS tablet Take 20 mg by mouth daily.    Marland Kitchen  zolpidem (AMBIEN) 5 MG tablet Take 5 mg by mouth as needed.      No current facility-administered medications for this visit.     Family History  Problem Relation Age of Onset  . Uterine cancer Mother   . Diabetes Mother   . Hypertension Mother   . Thyroid disease Mother   . Skin cancer Father   . Diabetes Maternal Grandmother   . Hypertension Maternal Grandmother   . Diabetes Paternal Grandfather     ROS:  Pertinent items are noted in HPI.  Otherwise, a comprehensive ROS was negative.  Exam:   BP 120/80   Pulse 68   Resp 16   Ht 5' 4.75" (1.645 m)   Wt 162 lb (73.5 kg)   BMI 27.17 kg/m  Height: 5' 4.75" (164.5 cm) Ht Readings from Last 3 Encounters:  11/24/18 5' 4.75" (1.645 m)  11/09/18 5\' 5"  (1.651 m)  11/18/17 5' 4.75" (1.645 m)    General appearance: alert, cooperative and appears stated age Head: Normocephalic, without obvious abnormality, atraumatic Neck: no adenopathy, supple, symmetrical, trachea midline and thyroid normal to inspection and palpation Lungs: clear to auscultation bilaterally Breasts: normal appearance, no masses or tenderness, No nipple retraction or dimpling, No nipple discharge or bleeding, No axillary or supraclavicular adenopathy Heart: regular rate and rhythm Abdomen: soft, non-tender; no masses,  no organomegaly Extremities: extremities normal,  atraumatic, no cyanosis or edema Skin: Skin color, texture, turgor normal. No rashes or lesions Lymph nodes: Cervical, supraclavicular, and axillary nodes normal. No abnormal inguinal nodes palpated Neurologic: Grossly normal   Pelvic: External genitalia:  no lesions              Urethra:  normal appearing urethra with no masses, tenderness or lesions              Bartholin's and Skene's: normal                 Vagina: normal appearing vagina with normal color and discharge, no lesions              Cervix: no cervical motion tenderness and no lesions              Pap taken: No. Bimanual Exam:   Uterus:  uterus absent              Adnexa: no mass, fullness, tenderness and adnexa surgically absent               Rectovaginal: Confirms               Anus:  normal sphincter tone, no lesions  Chaperone present: yes  A:  Well Woman with normal exam  S/P TAH supracervical with BSO, fibroids  Vaginal dryness lubrication working well  Hand and facial numbness under evaluation  Screening labs  P:   Reviewed health and wellness pertinent to exam  Aware to advise if vaginal bleeding  Continue follow up with hand and facial numbness with Neurology  Lab: Vitamin D, TSH, Lipid panel, CBC, CMP  Pap smear: no   counseled on breast self exam, mammography screening, feminine hygiene, adequate intake of calcium and vitamin D, diet and exercise  return annually or prn  An After Visit Summary was printed and given to the patient.

## 2018-11-25 LAB — VITAMIN D 25 HYDROXY (VIT D DEFICIENCY, FRACTURES): Vit D, 25-Hydroxy: 31 ng/mL (ref 30.0–100.0)

## 2018-11-25 LAB — COMPREHENSIVE METABOLIC PANEL
ALT: 34 IU/L — ABNORMAL HIGH (ref 0–32)
AST: 21 IU/L (ref 0–40)
Albumin/Globulin Ratio: 2.2 (ref 1.2–2.2)
Albumin: 4.7 g/dL (ref 3.8–4.8)
Alkaline Phosphatase: 72 IU/L (ref 39–117)
BUN/Creatinine Ratio: 14 (ref 9–23)
BUN: 9 mg/dL (ref 6–20)
Bilirubin Total: 0.6 mg/dL (ref 0.0–1.2)
CO2: 27 mmol/L (ref 20–29)
Calcium: 9.6 mg/dL (ref 8.7–10.2)
Chloride: 99 mmol/L (ref 96–106)
Creatinine, Ser: 0.64 mg/dL (ref 0.57–1.00)
GFR calc Af Amer: 130 mL/min/{1.73_m2} (ref >59)
GFR calc non Af Amer: 113 mL/min/{1.73_m2} (ref >59)
GLOBULIN, TOTAL: 2.1 g/dL (ref 1.5–4.5)
Glucose: 88 mg/dL (ref 65–99)
Potassium: 4.3 mmol/L (ref 3.5–5.2)
Sodium: 142 mmol/L (ref 134–144)
Total Protein: 6.8 g/dL (ref 6.0–8.5)

## 2018-11-25 LAB — CBC
Hematocrit: 42.1 % (ref 34.0–46.6)
Hemoglobin: 14.2 g/dL (ref 11.1–15.9)
MCH: 28.9 pg (ref 26.6–33.0)
MCHC: 33.7 g/dL (ref 31.5–35.7)
MCV: 86 fL (ref 79–97)
Platelets: 322 10*3/uL (ref 150–450)
RBC: 4.92 x10E6/uL (ref 3.77–5.28)
RDW: 12.6 % (ref 11.7–15.4)
WBC: 6.2 10*3/uL (ref 3.4–10.8)

## 2018-11-25 LAB — LIPID PANEL
Chol/HDL Ratio: 4.2 ratio (ref 0.0–4.4)
Cholesterol, Total: 212 mg/dL — ABNORMAL HIGH (ref 100–199)
HDL: 50 mg/dL (ref >39)
LDL Calculated: 144 mg/dL — ABNORMAL HIGH (ref 0–99)
Triglycerides: 91 mg/dL (ref 0–149)
VLDL Cholesterol Cal: 18 mg/dL (ref 5–40)

## 2018-11-25 LAB — TSH: TSH: 0.911 u[IU]/mL (ref 0.450–4.500)

## 2018-11-26 ENCOUNTER — Other Ambulatory Visit: Payer: Self-pay | Admitting: Certified Nurse Midwife

## 2018-11-26 DIAGNOSIS — R899 Unspecified abnormal finding in specimens from other organs, systems and tissues: Secondary | ICD-10-CM

## 2018-11-28 ENCOUNTER — Telehealth: Payer: Self-pay

## 2018-11-28 ENCOUNTER — Other Ambulatory Visit: Payer: Self-pay

## 2018-11-28 DIAGNOSIS — R899 Unspecified abnormal finding in specimens from other organs, systems and tissues: Secondary | ICD-10-CM

## 2018-11-28 NOTE — Addendum Note (Signed)
Addended by: Susy Manor on: 11/28/2018 04:10 PM   Modules accepted: Orders

## 2018-11-28 NOTE — Telephone Encounter (Signed)
Patient returned call

## 2018-11-28 NOTE — Progress Notes (Signed)
Lab orders cancelled & future orders placed in a new encounter.

## 2018-11-28 NOTE — Telephone Encounter (Signed)
-----   Message from Regina Eck, CNM sent at 11/26/2018  4:42 PM EST ----- Notify patient her Cholesterol is borderline elevated at 212 normal< 199, triglycerides 91 normal <149 HDL 50 >39 normal, LDL 144 which is elevated <99 normal one year ago 109.  Work on regular exercise, decrease high fat and cholesterol food, increase whole grains, vegetables and lean meat and fish   Recheck in 6 months fasting order placed TSH is normal Vitamin D is borderline normal at 31 Liver, kidney and glucose essentially normal, ALT is slightly elevated at 34 normal <32. Avoid over use of OTC pain relief and alcohol which can affect the liver, recheck in  one month order placed CBC is normal, no anemia

## 2018-11-28 NOTE — Telephone Encounter (Signed)
Left message for call back.

## 2018-11-28 NOTE — Telephone Encounter (Signed)
Patient notified of results as written by provider 

## 2018-11-30 ENCOUNTER — Telehealth: Payer: Self-pay | Admitting: Certified Nurse Midwife

## 2018-11-30 NOTE — Telephone Encounter (Signed)
11/24/18 lab results released to [facility].   Written orders signed by Melvia Heaps, CNM for 1 month ALT and 6 month fasting lipid panel.   Call to patient to confirm fax number provided. Fax ATTN: Rhae Hammock. Advised MyChart results have been released. Patient verbalizes understanding and is agreeable.   Written lab orders faxed. Copy of orders to scan.   Routing to provider for final review. Patient is agreeable to disposition. Will close encounter.

## 2018-11-30 NOTE — Telephone Encounter (Signed)
Patient sent the following correspondence through [facility]. Routing to triage to assist patient with request.  Please make my labs available in my chart and fax the upcoming lab orders to [phone] so I can have our lab girl draw them and fax you. Thanks!

## 2018-12-12 ENCOUNTER — Encounter: Payer: Self-pay | Admitting: Neurology

## 2018-12-12 ENCOUNTER — Ambulatory Visit: Payer: No Typology Code available for payment source | Admitting: Neurology

## 2018-12-12 VITALS — BP 110/70 | HR 69 | Ht 64.75 in | Wt 162.5 lb

## 2018-12-12 DIAGNOSIS — R5383 Other fatigue: Secondary | ICD-10-CM

## 2018-12-12 DIAGNOSIS — R200 Anesthesia of skin: Secondary | ICD-10-CM | POA: Diagnosis not present

## 2018-12-12 DIAGNOSIS — R9082 White matter disease, unspecified: Secondary | ICD-10-CM

## 2018-12-12 NOTE — Progress Notes (Signed)
[facility]  PATIENT: Sheri Moore DOB: 1980/11/03  REFERRING DOCTOR OR PCP: Rochel Brome, MD SOURCE: Patient, notes from Dr. Tobie Poet, notes from Dr. Jacqulynn Cadet (weight for his [facility] center), imaging, study and lab reports, multiple MRI images personally reviewed  _________________________________   HISTORICAL  CHIEF COMPLAINT:  Chief Complaint  Patient presents with  . Follow-up    RM 10 w/ spouse. Last seen 1/40/20. Feels armodafinil has helped with fatigue. Tolerating well, no SE. Still having tingling in left arm, lips, tongue and throat.    HISTORY OF PRESENT ILLNESS:  Sheri Moore is a 38 y.o. woman with numbness, fatigue, cognitive changes and abnormal brain MRI.  Update 12/12/2018: The last visit she was started on armodafinil for fatigue and she feels there is some benefits.  She continues to note tingling in the left arm, lips and throat.   She feels the tingling is about the same.  The tingling is not painful ut is a nuisance.    The tingling is in the 4th and 5th fingers of the left hand.    Tingling is 24/7 without much fluctuations.   She has a left Tinel's sign.    Since the last visit, she had an echocardiogram.  The Echocardiogram 11/17/2018 was essentially normal.  Specifically there was no evidence of PFO or other shunt.  Additionally, 11/15/2018, she had an MRI of the cervical spine that was normal without spinal cord lesions or significant degenerative change.    The MRI of the brain shows white matter foci but they are nonspecific and more consistent with chronic microvascular ischemic change than with demyelination.  She had had white matter abnormalities as early as 2004 and had a normal lumbar puncture at that time.  From 11/09/2018 [name] is a 38 year old woman who began to experience vertigo and headaches about 2004.    She had a brain MRI performedshowing white matter abnormalities and saw Dr. Jannifer Franklin.   An LP was performed and CSF was normal in  2004.    She had a post-LP headache and a blood patch was needed (03/11/2003).  She was referred to Dr. Jacqulynn Cadet.       At some point in time a second LP was performed and it was also negative.    She was seen by Dr. Dellis Filbert at [facility] between 2006 and 2009 and had a couple of MRI scans performed.  Besides the vertigo, she also reported a mental fog around that time with reduced focus and attention.   She was not definitively diagnosed with multiple sclerosis.  Around October 2019, she had the onset of lip tingling bilaterally and then mor eof the face and the left arm had tingling.     The arm tingling is constant while the facial tingling is intermittent.   The right arm is sometimes feeling heavy but no tingling/numbness.   Legs are fine.   Her bladder and bowel function are unchanged.   Vision acuity is fine with glasses.   She has symmetric color vision.  She has borderline elevated IOP.      She has had fatigue x many years, worse in the heat (like at ball games) and will sometimes feel too tired to do more activities later in the day.    She is falling asleep most nights and gets 8 hours of sleep most nights.  She has both depression and anxiety that worsened last year.   She was recently switched to Trintellix from Cymbalta  and citalopram.     She feels memory is poor.  Focus and attention is poor.   She did not have ADD signs/symptoms as a child or teen.   She feels the cognitive issues have progressed further over the last month.      She has occasionally headaches, mostly mild to moderate and helped by Tylenol.  Only a few have nausea.   No associated neurologic symptoms or aura.    Last year she had a home sleep study.  By her report, it was normal..  Weight has not changed since that time.  DATA Personally reviewed:  MRI of the brain 10/27/2018 was personally reviewed and compared to a study 11/15/2007 and 02/25/2005.  There are multiple T2/flair hyperintense foci predominantly in the  subcortical white matter.  Some IN the deep white matter and a couple are periventricular though none are in the callosal septal fibers.   Compared to MRIs from 11/15/2007 another MRI from 2007, there has been some progression over the last 10 years.  No foci are noted in the brainstem or cerebellum.  SSEP, VEP 07/05/2003 was normal  Labs 06/10/2003 and 06/12/03:  Anti cardiolipin, Prot C, Prot S, Facotr V Lieden, TPA, AT III, Lyme, ANA, B12, ESR  Notes from Dr. Jacqulynn Cadet (and NP [facility]) 06/10/2003, 07/09/2003, 02/14/2004, 10/02/2004, 04/05/2005, 8/3/20061/30/2007, 04/04/2006, 05/23/2006, 10/25/2007  EMG/NCV 10/17/2018 Impression: This NCV/EMG study shows the following: 1.   Minimal left ulnar neuropathy across the wrist. 2.   There is no definite evidence of radiculopathy though mild radiculopathies cannot always be detected by EMG.  Her grandfather had Wegener's disease and an MI.  Her maternal grandmother had a stroke  REVIEW OF SYSTEMS: Constitutional: No fevers, chills, sweats, or change in appetite.  She has fatigue.  She has restless leg syndrome. Eyes: No visual changes, double vision, eye pain Ear, nose and throat: No hearing loss, ear pain, nasal congestion, sore throat Cardiovascular: No chest pain, palpitations Respiratory: No shortness of breath at rest or with exertion.   No wheezes GastrointestinaI: No nausea, vomiting, diarrhea, abdominal pain, fecal incontinence Genitourinary: No dysuria, urinary retention or frequency.  No nocturia. Musculoskeletal: No neck pain, back pain Integumentary: No rash, pruritus, skin lesions Neurological: as above Psychiatric: No depression at this time.  No anxiety Endocrine: No palpitations, diaphoresis, change in appetite, change in weigh or increased thirst Hematologic/Lymphatic: No anemia, purpura, petechiae. Allergic/Immunologic: No itchy/runny eyes, nasal congestion, recent allergic reactions, rashes  ALLERGIES: No Known Allergies  HOME  MEDICATIONS:  Current Outpatient Medications:  .  Armodafinil 200 MG TABS, Take 1 tablet by mouth every morning., Disp: 30 tablet, Rfl: 3 .  montelukast (SINGULAIR) 10 MG tablet, as needed., Disp: , Rfl:  .  vortioxetine HBr (TRINTELLIX) 20 MG TABS tablet, Take 20 mg by mouth daily., Disp: , Rfl:  .  zolpidem (AMBIEN) 5 MG tablet, Take 5 mg by mouth as needed. , Disp: , Rfl:   PAST MEDICAL HISTORY: Past Medical History:  Diagnosis Date  . Anxiety   . Depression   . Fibroid   . Gastroschisis     PAST SURGICAL HISTORY: Past Surgical History:  Procedure Laterality Date  . ABDOMINAL HYSTERECTOMY     supracervical hysterectomy  . ABDOMINAL SURGERY    . OOPHORECTOMY      FAMILY HISTORY: Family History  Problem Relation Age of Onset  . Uterine cancer Mother   . Diabetes Mother   . Hypertension Mother   . Thyroid disease Mother   .  Skin cancer Father   . Diabetes Maternal Grandmother   . Hypertension Maternal Grandmother   . Diabetes Paternal Grandfather     SOCIAL HISTORY:  Social History   Socioeconomic History  . Marital status: Married    Spouse name: Not on file  . Number of children: 1  . Years of education: College  . Highest education level: Not on file  Occupational History  . Occupation: [employer]  Social Needs  . Financial resource strain: Not on file  . Food insecurity:    Worry: Not on file    Inability: Not on file  . Transportation needs:    Medical: Not on file    Non-medical: Not on file  Tobacco Use  . Smoking status: Former Smoker    Last attempt to quit: 10/11/2001    Years since quitting: 17.1  . Smokeless tobacco: Never Used  Substance and Sexual Activity  . Alcohol use: Yes    Comment: occ  . Drug use: No  . Sexual activity: Yes    Partners: Female    Birth control/protection: Surgical    Comment: supracervical hysterectomy  Lifestyle  . Physical activity:    Days per week: Not on file    Minutes per session: Not on  file  . Stress: Not on file  Relationships  . Social connections:    Talks on phone: Not on file    Gets together: Not on file    Attends religious service: Not on file    Active member of club or organization: Not on file    Attends meetings of clubs or organizations: Not on file    Relationship status: Not on file  . Intimate partner violence:    Fear of current or ex partner: Not on file    Emotionally abused: Not on file    Physically abused: Not on file    Forced sexual activity: Not on file  Other Topics Concern  . Not on file  Social History Narrative   Lives with husband and son   Caffeine use: rarely   Right handed      PHYSICAL EXAM  Vitals:   12/12/18 1259  BP: 110/70  Pulse: 69  Weight: 162 lb 8 oz (73.7 kg)  Height: 5' 4.75" (1.645 m)    Body mass index is 27.25 kg/m.   General: The patient is well-developed and well-nourished and in no acute distress  Skin: Extremities are without rash or edema  Neurologic Exam  Mental status: The patient is alert and oriented x 3 at the time of the examination. The patient has apparent normal recent and remote memory, with an apparently normal attention span and concentration ability.   Speech is normal.  Cranial nerves: Extraocular movements are full.  Facial strength and sensation (including inside the mouth and tongue) is normal.  Palatal elevation and tongue protrusion midline.  The tongue is midline, and the patient has symmetric elevation of the soft palate. No obvious hearing deficits are noted.  Motor:  Muscle bulk is normal.   Tone is normal. Strength is  5 / 5 in all 4 extremities including the ulnar innervated hand muscles.  Sensory: She has a Tinel sign at the left elbow.  Sensory testing shows reduced touch sensation at the left hypothenar eminence.      Coordination: Finger-nose-finger and heel-to-shin was performed well.  Gait and station: Station is normal.   Gait is normal. Tandem gait is normal.  Romberg is negative.  Reflexes: Deep tendon reflexes are symmetric and normal bilaterally.      DIAGNOSTIC DATA (LABS, IMAGING, TESTING) - I reviewed patient records, labs, notes, testing and imaging myself where available.  Lab Results  Component Value Date   WBC 6.2 11/24/2018   HGB 14.2 11/24/2018   HCT 42.1 11/24/2018   MCV 86 11/24/2018   PLT 322 11/24/2018      Component Value Date/Time   NA 142 11/24/2018 1340   K 4.3 11/24/2018 1340   CL 99 11/24/2018 1340   CO2 27 11/24/2018 1340   GLUCOSE 88 11/24/2018 1340   BUN 9 11/24/2018 1340   CREATININE 0.64 11/24/2018 1340   CALCIUM 9.6 11/24/2018 1340   PROT 6.8 11/24/2018 1340   ALBUMIN 4.7 11/24/2018 1340   AST 21 11/24/2018 1340   ALT 34 (H) 11/24/2018 1340   ALKPHOS 72 11/24/2018 1340   BILITOT 0.6 11/24/2018 1340   GFRNONAA 113 11/24/2018 1340   GFRAA 130 11/24/2018 1340   Lab Results  Component Value Date   CHOL 212 (H) 11/24/2018   HDL 50 11/24/2018   LDLCALC 144 (H) 11/24/2018   TRIG 91 11/24/2018   CHOLHDL 4.2 11/24/2018   No results found for: HGBA1C No results found for: VITAMINB12 Lab Results  Component Value Date   TSH 0.911 11/24/2018       ASSESSMENT AND PLAN  Left arm numbness  White matter abnormality on MRI of brain  Other fatigue  Facial numbness  1.   I reviewed the MRI of the spine in her presence.   There is no evidence of demyelination.  We had a long discussion about the significance of the white matter abnormality seen on the brain MRI.  Of note, changes were first noted on the 2004 MRI and there is only very mild difference between 2009 and 2020.   I believe the most likely represents chronic microvascular ischemic change rather than demyelination.  I do not have a good explanation for the numbness in the lip and mouth.  There were no abnormalities noted on the MRI involving the pons or the left cranial nerves.  The numbness in the hand most likely is due to an ulnar  neuropathy. 2.    She is advised to stay active and exercise as tolerated.   3.    She will return to see me in 8 months or sooner if there are new or worsening neurologic symptoms.  Richard A. Felecia Shelling, MD, PhD, FAAN Certified in Neurology, Clinical Neurophysiology, Sleep Medicine, Pain Medicine and Neuroimaging Director, [facility] [address] [place], [place] [id number] [phone]

## 2018-12-23 MED FILL — ARMODAFINIL 200 MG TABLET: 200 | 30 days supply | Qty: 30 | Fill #1 | Status: TO

## 2018-12-23 MED FILL — ZOLPIDEM TARTRATE 5 MG TAB: 5 | 30 days supply | Qty: 30 | Fill #0 | Status: TO

## 2019-01-17 ENCOUNTER — Telehealth: Payer: Self-pay | Admitting: Obstetrics and Gynecology

## 2019-01-17 NOTE — Telephone Encounter (Signed)
This is Dr. Quincy Simmonds reviewing labs for Sheri Moore while she is out of the office.    Please contact patient regarding her ALT level drawn at [facility] in [place].  The level on 12/29/18 was 24, which is normal. (Result received by fax and is not in Epic at this time but will be scanned in.) It was previously 34 on 11/24/18.   A copy of her cholesterol results was recheck was note received, so I do not know if this was drawn of not.

## 2019-01-18 NOTE — Telephone Encounter (Signed)
Spoke with patient. Message given as seen below from [place]. Patient verbalizes understanding. States that she discussed with Melvia Heaps CNM that she will have her cholesterol level rechecked in 6 months.   Routing to provider and will close encounter.

## 2019-01-19 MED FILL — ZOLPIDEM TARTRATE 5 MG TAB: 5 | 30 days supply | Qty: 30 | Fill #0

## 2019-01-19 MED FILL — ARMODAFINIL 200 MG TABS: 200 | 30 days supply | Qty: 30 | Fill #0

## 2019-01-30 ENCOUNTER — Other Ambulatory Visit: Payer: Self-pay | Admitting: Certified Nurse Midwife

## 2019-01-30 DIAGNOSIS — Z1231 Encounter for screening mammogram for malignant neoplasm of breast: Secondary | ICD-10-CM

## 2019-02-14 MED FILL — TRINTELLIX 20 MG TABLET: 20 | 90 days supply | Qty: 90 | Fill #0

## 2019-02-20 MED FILL — ZOLPIDEM TARTRATE 5 MG TAB: 5 | 30 days supply | Qty: 30 | Fill #1

## 2019-02-20 MED FILL — ARMODAFINIL 200 MG TABS: 200 | 30 days supply | Qty: 30 | Fill #1

## 2019-03-29 ENCOUNTER — Other Ambulatory Visit: Payer: Self-pay | Admitting: Neurology

## 2019-03-29 MED FILL — ZOLPIDEM TARTRATE 5 MG TAB: 5 | 30 days supply | Qty: 30 | Fill #2

## 2019-03-29 MED FILL — ARMODAFINIL 200 MG TABS: 200 | 30 days supply | Qty: 30 | Fill #0

## 2019-04-30 MED FILL — ZOLPIDEM TARTRATE 5 MG TAB: 5 | 30 days supply | Qty: 30 | Fill #3

## 2019-04-30 MED FILL — ARMODAFINIL 200 MG TABS: 200 | 30 days supply | Qty: 30 | Fill #1

## 2019-05-22 ENCOUNTER — Other Ambulatory Visit: Payer: Self-pay | Admitting: Neurology

## 2019-05-22 MED ORDER — ARMODAFINIL 250 MG PO TABS: 250.0000 mg | ORAL_TABLET | Freq: Every day | ORAL | 5 refills | Status: DC

## 2019-05-22 MED FILL — TRINTELLIX 20 MG TABLET: 20 | 90 days supply | Qty: 90 | Fill #0

## 2019-05-24 MED FILL — ARMODAFINIL 250 MG TABLET: 250 | 30 days supply | Qty: 30 | Fill #0

## 2019-06-08 ENCOUNTER — Other Ambulatory Visit: Payer: Self-pay

## 2019-06-08 ENCOUNTER — Ambulatory Visit
Admission: RE | Admit: 2019-06-08 | Discharge: 2019-06-08 | Disposition: A | Discharge status: Home / Self Care | Payer: No Typology Code available for payment source | Source: Ambulatory Visit | Attending: Certified Nurse Midwife | Admitting: Certified Nurse Midwife

## 2019-06-08 DIAGNOSIS — Z1231 Encounter for screening mammogram for malignant neoplasm of breast: Secondary | ICD-10-CM

## 2019-06-20 MED FILL — ARMODAFINIL 250 MG TABLET: 250 | 30 days supply | Qty: 30 | Fill #1

## 2019-06-20 MED FILL — ZOLPIDEM TARTRATE 5 MG TAB: 5 | 30 days supply | Qty: 30 | Fill #4

## 2019-07-24 ENCOUNTER — Telehealth: Payer: Self-pay | Admitting: *Deleted

## 2019-07-24 NOTE — Telephone Encounter (Signed)
Called pt. Advised Dr. Felecia Shelling spoke with PCP about working her in for sooner appt. Scheduled appt for 07/30/2019 at 1:30pm with Dr. Felecia Shelling. Advised her to check in by 1:00pm, wear a mask. Explained check in procedure. She verbalized understanding and appreciation for call.

## 2019-07-30 ENCOUNTER — Encounter: Payer: Self-pay | Admitting: Neurology

## 2019-07-30 ENCOUNTER — Other Ambulatory Visit: Payer: Self-pay

## 2019-07-30 ENCOUNTER — Ambulatory Visit: Payer: No Typology Code available for payment source | Admitting: Neurology

## 2019-07-30 VITALS — BP 148/92 | HR 83 | Temp 97.6°F | Ht 65.0 in | Wt 163.0 lb

## 2019-07-30 DIAGNOSIS — R5383 Other fatigue: Secondary | ICD-10-CM

## 2019-07-30 DIAGNOSIS — F418 Other specified anxiety disorders: Secondary | ICD-10-CM

## 2019-07-30 DIAGNOSIS — R9082 White matter disease, unspecified: Secondary | ICD-10-CM | POA: Diagnosis not present

## 2019-07-30 DIAGNOSIS — R29898 Other symptoms and signs involving the musculoskeletal system: Secondary | ICD-10-CM

## 2019-07-30 DIAGNOSIS — R200 Anesthesia of skin: Secondary | ICD-10-CM

## 2019-07-30 MED ORDER — AMPHETAMINE-DEXTROAMPHET ER 15 MG PO CP24: 15.0000 mg | ORAL_CAPSULE | ORAL | 0 refills | Status: DC

## 2019-07-30 NOTE — Progress Notes (Signed)
[facility]  PATIENT: Sheri Moore DOB: 30-Aug-1981  REFERRING DOCTOR OR PCP: Rochel Brome, MD SOURCE: Patient, notes from Dr. Tobie Poet, notes from Dr. Jacqulynn Cadet (weight for his MS center), imaging, study and lab reports, multiple MRI images personally reviewed  _________________________________   HISTORICAL  CHIEF COMPLAINT:  Chief Complaint  Patient presents with  . Follow-up    pt alone, rm 12    HISTORY OF PRESENT ILLNESS:  Sheri Moore is a 38 y.o. woman with numbness, fatigue, cognitive changes and abnormal brain MRI.  Update 07/30/2019: She is reporting more trouble with her focus and memory and feels she is distractible - brain fog in general.  She is on armodafinil 250 mg.  It seemed to help a little bit at first but even  A higher dose is not helping now.    She did not have ADD when younger.      She also reports her arms feel heavy.  This is more symmetric than previously when the left was affected more than the right..  Gait is fine.   She gets some intermittent dysesthesias but  She sleeps well on Ambien.   She feels refreshed when she wakes up.   She is tired but not sleepy and does not doze off much.    Mood is doing ok in general.    She is on Trintellix (Dr. Tobie Poet writes).   Her ferritin was high and she was found to be a carrier for hemochromatosis.     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  MRI was abnl in 2004 and only minimal progression over the years.  EPWORTH SLEEPINESS SCALE  On a scale of 0 - 3 what is the chance of dozing:  Sitting and Reading:  0 Watching TV:   0 Sitting inactive in a public place: 0 Passenger in car for one hour: 0 Lying down to rest in the afternoon: 3 Sitting and talking to someone: 0 Sitting quietly after lunch:  0 In a car, stopped in traffic:  0  Total (out of 24):   3/24 (normal)   Update 12/12/2018: The last visit she was started  on armodafinil for fatigue and she feels there is some benefits.  She continues to note tingling in the left arm, lips and throat.   She feels the tingling is about the same.  The tingling is not painful ut is a nuisance.    The tingling is in the 4th and 5th fingers of the left hand.    Tingling is 24/7 without much fluctuations.   She has a left Tinel's sign.    Since the last visit, she had an echocardiogram.  The Echocardiogram 11/17/2018 was essentially normal.  Specifically there was no evidence of PFO or other shunt.  Additionally, 11/15/2018, she had an MRI of the cervical spine that was normal without spinal cord lesions or significant degenerative change.    The MRI of the brain shows white matter foci but they are nonspecific and more consistent with chronic microvascular ischemic change than with demyelination.  She had had white matter abnormalities as early as 2004 and had a normal lumbar puncture at that time.  From 11/09/2018 Ms. Donavan is a 38 year old woman who began to experience vertigo and headaches about 2004.    She had a brain MRI performedshowing white matter abnormalities and saw Dr. Jannifer Franklin.   An LP was performed and CSF was normal in  2004.    She had a post-LP headache and a blood patch was needed (03/11/2003).  She was referred to Dr. Jacqulynn Cadet.       At some point in time a second LP was performed and it was also negative.    She was seen by Dr. Dellis Filbert at [facility] between 2006 and 2009 and had a couple of MRI scans performed.  Besides the vertigo, she also reported a mental fog around that time with reduced focus and attention.   She was not definitively diagnosed with multiple sclerosis.  Around October 2019, she had the onset of lip tingling bilaterally and then mor eof the face and the left arm had tingling.     The arm tingling is constant while the facial tingling is intermittent.   The right arm is sometimes feeling heavy but no tingling/numbness.   Legs are fine.   Her  bladder and bowel function are unchanged.   Vision acuity is fine with glasses.   She has symmetric color vision.  She has borderline elevated IOP.      She has had fatigue x many years, worse in the heat (like at ball games) and will sometimes feel too tired to do more activities later in the day.    She is falling asleep most nights and gets 8 hours of sleep most nights.  She has both depression and anxiety that worsened last year.   She was recently switched to Trintellix from Cymbalta and citalopram.     She feels memory is poor.  Focus and attention is poor.   She did not have ADD signs/symptoms as a child or teen.   She feels the cognitive issues have progressed further over the last month.      She has occasionally headaches, mostly mild to moderate and helped by Tylenol.  Only a few have nausea.   No associated neurologic symptoms or aura.    Last year she had a home sleep study.  By her report, it was normal..  Weight has not changed since that time.  DATA Personally reviewed:  MRI of the brain 10/27/2018 was personally reviewed and compared to a study 11/15/2007 and 02/25/2005.  There are multiple T2/flair hyperintense foci predominantly in the subcortical white matter.  Some IN the deep white matter and a couple are periventricular though none are in the callosal septal fibers.   Compared to MRIs from 11/15/2007 another MRI from 2007, there has been some progression over the last 10 years.  No foci are noted in the brainstem or cerebellum.  SSEP, VEP 07/05/2003 was normal  Labs 06/10/2003 and 06/12/03:  Anti cardiolipin, Prot C, Prot S, Facotr V Lieden, TPA, AT III, Lyme, ANA, B12, ESR  Notes from Dr. Jacqulynn Cadet (and NP [facility]) 06/10/2003, 07/09/2003, 02/14/2004, 10/02/2004, 04/05/2005, 8/3/20061/30/2007, 04/04/2006, 05/23/2006, 10/25/2007  EMG/NCV 10/17/2018 Impression: This NCV/EMG study shows the following: 1.   Minimal left ulnar neuropathy across the wrist. 2.   There is no definite evidence of  radiculopathy though mild radiculopathies cannot always be detected by EMG.  Her grandfather had Wegener's disease and an MI.  Her maternal grandmother had a stroke  REVIEW OF SYSTEMS: Constitutional: No fevers, chills, sweats, or change in appetite.  She has fatigue.  She has restless leg syndrome. Eyes: No visual changes, double vision, eye pain Ear, nose and throat: No hearing loss, ear pain, nasal congestion, sore throat Cardiovascular: No chest pain, palpitations Respiratory: No shortness of breath at rest or with  exertion.   No wheezes GastrointestinaI: No nausea, vomiting, diarrhea, abdominal pain, fecal incontinence Genitourinary: No dysuria, urinary retention or frequency.  No nocturia. Musculoskeletal: No neck pain, back pain Integumentary: No rash, pruritus, skin lesions Neurological: as above Psychiatric: No depression at this time.  No anxiety Endocrine: No palpitations, diaphoresis, change in appetite, change in weigh or increased thirst Hematologic/Lymphatic: No anemia, purpura, petechiae. Allergic/Immunologic: No itchy/runny eyes, nasal congestion, recent allergic reactions, rashes  ALLERGIES: No Known Allergies  HOME MEDICATIONS:  Current Outpatient Medications:  .  Armodafinil 250 MG tablet, Take 1 tablet (250 mg total) by mouth daily., Disp: 30 tablet, Rfl: 5 .  busPIRone (BUSPAR) 10 MG tablet, Take 10 mg by mouth 2 (two) times daily., Disp: , Rfl:  .  pseudoephedrine (SUDAFED) 120 MG 12 hr tablet, Take 120 mg by mouth daily as needed for congestion (last taken this morning 07/30/19)., Disp: , Rfl:  .  vortioxetine HBr (TRINTELLIX) 20 MG TABS tablet, Take 20 mg by mouth daily., Disp: , Rfl:  .  zolpidem (AMBIEN) 5 MG tablet, Take 5 mg by mouth as needed. , Disp: , Rfl:  .  amphetamine-dextroamphetamine (ADDERALL XR) 15 MG 24 hr capsule, Take 1 capsule by mouth every morning., Disp: 30 capsule, Rfl: 0  PAST MEDICAL HISTORY: Past Medical History:  Diagnosis  Date  . Anxiety   . Depression   . Fibroid   . Gastroschisis     PAST SURGICAL HISTORY: Past Surgical History:  Procedure Laterality Date  . ABDOMINAL HYSTERECTOMY     supracervical hysterectomy  . ABDOMINAL SURGERY    . OOPHORECTOMY      FAMILY HISTORY: Family History  Problem Relation Age of Onset  . Uterine cancer Mother   . Diabetes Mother   . Hypertension Mother   . Thyroid disease Mother   . Skin cancer Father   . Diabetes Maternal Grandmother   . Hypertension Maternal Grandmother   . Diabetes Paternal Grandfather   . Breast cancer Neg Hx     SOCIAL HISTORY:  Social History   Socioeconomic History  . Marital status: Married    Spouse name: Not on file  . Number of children: 1  . Years of education: College  . Highest education level: Not on file  Occupational History  . Occupation: [employer]  Social Needs  . Financial resource strain: Not on file  . Food insecurity    Worry: Not on file    Inability: Not on file  . Transportation needs    Medical: Not on file    Non-medical: Not on file  Tobacco Use  . Smoking status: Former Smoker    Quit date: 10/11/2001    Years since quitting: 17.8  . Smokeless tobacco: Never Used  Substance and Sexual Activity  . Alcohol use: Yes    Comment: occ  . Drug use: No  . Sexual activity: Yes    Partners: Female    Birth control/protection: Surgical    Comment: supracervical hysterectomy  Lifestyle  . Physical activity    Days per week: Not on file    Minutes per session: Not on file  . Stress: Not on file  Relationships  . Social Herbalist on phone: Not on file    Gets together: Not on file    Attends religious service: Not on file    Active member of club or organization: Not on file    Attends meetings of clubs or organizations: Not on  file    Relationship status: Not on file  . Intimate partner violence    Fear of current or ex partner: Not on file    Emotionally abused: Not on  file    Physically abused: Not on file    Forced sexual activity: Not on file  Other Topics Concern  . Not on file  Social History Narrative   Lives with husband and son   Caffeine use: rarely   Right handed      PHYSICAL EXAM  Vitals:   07/30/19 1321  BP: (!) 148/92  Pulse: 83  Temp: 97.6 F (36.4 C)  Weight: 163 lb (73.9 kg)  Height: 5' 5" (1.651 m)    Body mass index is 27.12 kg/m.   General: The patient is well-developed and well-nourished and in no acute distress  Skin: Extremities are without rash or edema  Neurologic Exam  Mental status: The patient is alert and oriented x 3 at the time of the examination. The patient has apparent normal recent and remote memory, with an apparently normal attention span and concentration ability.   Speech is normal.  Cranial nerves: Extraocular movements are full.  Normal facial strength.  The tongue is midline, and the patient has symmetric elevation of the soft palate. No obvious hearing deficits are noted.  Motor:  Muscle bulk is normal.   Tone is normal. Strength is  5 / 5 in all 4 extremities including the ulnar innervated hand muscles.  Sensory: She has a Tinel sign at the left elbow.  Today, sensation is symmetric.  Coordination: Finger-nose-finger and heel-to-shin was performed well.  Gait and station: Station is normal.   Gait is normal. Tandem gait is normal. Romberg is negative.   Reflexes: Deep tendon reflexes are symmetric and normal bilaterally.      DIAGNOSTIC DATA (LABS, IMAGING, TESTING) - I reviewed patient records, labs, notes, testing and imaging myself where available.  Lab Results  Component Value Date   WBC 6.2 11/24/2018   HGB 14.2 11/24/2018   HCT 42.1 11/24/2018   MCV 86 11/24/2018   PLT 322 11/24/2018      Component Value Date/Time   NA 142 11/24/2018 1340   K 4.3 11/24/2018 1340   CL 99 11/24/2018 1340   CO2 27 11/24/2018 1340   GLUCOSE 88 11/24/2018 1340   BUN 9 11/24/2018 1340    CREATININE 0.64 11/24/2018 1340   CALCIUM 9.6 11/24/2018 1340   PROT 6.8 11/24/2018 1340   ALBUMIN 4.7 11/24/2018 1340   AST 21 11/24/2018 1340   ALT 34 (H) 11/24/2018 1340   ALKPHOS 72 11/24/2018 1340   BILITOT 0.6 11/24/2018 1340   GFRNONAA 113 11/24/2018 1340   GFRAA 130 11/24/2018 1340   Lab Results  Component Value Date   CHOL 212 (H) 11/24/2018   HDL 50 11/24/2018   LDLCALC 144 (H) 11/24/2018   TRIG 91 11/24/2018   CHOLHDL 4.2 11/24/2018   No results found for: HGBA1C No results found for: VITAMINB12 Lab Results  Component Value Date   TSH 0.911 11/24/2018       ASSESSMENT AND PLAN  White matter abnormality on MRI of brain  Other fatigue  Depression with anxiety  Left arm numbness  Left arm weakness  1.  We discussed the MRIs.  She does have multiple T2/flair hyperintense foci but they are small round and subcortical, more consistent with chronic microvascular ischemic change or sequela of migraine than to MS.  Additionally, there has been only  minimal progression over many years which would be atypical for MS.  Additionally, the lumbar puncture x2 more than a decade ago were negative for oligoclonal bands  2.    Her main problem is fatigue and attention deficit.  Her mood seems to be doing better and she is sleeping well.  She snores but has never been noted to have gasping or pauses and she does not have excessive sleepiness.  Therefore significant OSA would be unlikely.  I will have her try Adderall XR in the hope that that helps her symptomatically.  If she does not get any improvement would also consider referral for neurocognitive testing 3.   She is advised to stay active and exercise as tolerated.   4.    She will return to see me in 6 months or sooner if there are new or worsening neurologic symptoms.  Richard A. Felecia Shelling, MD, PhD, FAAN Certified in Neurology, Clinical Neurophysiology, Sleep Medicine, Pain Medicine and Neuroimaging Director, [facility] [address] [place], [place] [address] [phone]

## 2019-08-16 ENCOUNTER — Ambulatory Visit: Payer: No Typology Code available for payment source | Admitting: Neurology

## 2019-08-24 ENCOUNTER — Other Ambulatory Visit: Payer: Self-pay

## 2019-08-28 MED ORDER — AMPHETAMINE-DEXTROAMPHET ER 15 MG PO CP24: 15.0000 mg | ORAL_CAPSULE | ORAL | 0 refills | Status: DC

## 2019-08-28 MED FILL — ARMODAFINIL 200 MG TABS: 200 | 30 days supply | Qty: 30 | Fill #2

## 2019-09-03 MED FILL — ADDERALL XR 20 MG CAP SA: 20 | 30 days supply | Qty: 30 | Fill #0

## 2019-09-03 MED FILL — TRINTELLIX 20 MG TABLET: 20 | 90 days supply | Qty: 90 | Fill #0

## 2019-09-04 MED FILL — busPIRone HCL 10 MG TABS: 10 | 30 days supply | Qty: 60 | Fill #0

## 2019-10-03 MED FILL — ADDERALL XR 20 MG CAP SA: 20 | 30 days supply | Qty: 30 | Fill #0

## 2019-11-01 MED FILL — ZOLPIDEM TARTRATE 5 MG TAB: 5 | 90 days supply | Qty: 90 | Fill #0

## 2019-11-13 ENCOUNTER — Other Ambulatory Visit: Payer: Self-pay | Admitting: Family Medicine

## 2019-11-13 MED ORDER — BUSPIRONE HCL 10 MG PO TABS: 10.0000 mg | ORAL_TABLET | Freq: Two times a day (BID) | ORAL | 1 refills | Status: DC

## 2019-11-13 MED ORDER — AMPHETAMINE-DEXTROAMPHET ER 15 MG PO CP24: 15.0000 mg | ORAL_CAPSULE | ORAL | 0 refills | Status: DC

## 2019-11-14 MED FILL — ADDERALL XR 15 MG CAP SA: 15 | 30 days supply | Qty: 30 | Fill #0

## 2019-11-14 MED FILL — busPIRone HCL 10 MG TABS: 10 | 90 days supply | Qty: 180 | Fill #0

## 2019-11-22 ENCOUNTER — Other Ambulatory Visit: Payer: Self-pay | Admitting: Family Medicine

## 2019-11-22 MED ORDER — AMPHETAMINE-DEXTROAMPHETAMINE 20 MG PO TABS: 20.0000 mg | ORAL_TABLET | Freq: Every day | ORAL | 0 refills | Status: DC

## 2019-11-22 MED FILL — DEXTROAMP-AMPHETAMIN 20 MG: 20 | 30 days supply | Qty: 30 | Fill #0

## 2019-11-28 ENCOUNTER — Other Ambulatory Visit: Payer: Self-pay

## 2019-11-30 ENCOUNTER — Encounter: Payer: Self-pay | Admitting: Certified Nurse Midwife

## 2019-11-30 ENCOUNTER — Other Ambulatory Visit: Payer: Self-pay

## 2019-11-30 ENCOUNTER — Ambulatory Visit (INDEPENDENT_AMBULATORY_CARE_PROVIDER_SITE_OTHER): Payer: No Typology Code available for payment source | Admitting: Certified Nurse Midwife

## 2019-11-30 VITALS — BP 118/78 | HR 72 | Temp 98.7°F | Resp 16 | Ht 64.75 in | Wt 165.0 lb

## 2019-11-30 DIAGNOSIS — Z01419 Encounter for gynecological examination (general) (routine) without abnormal findings: Secondary | ICD-10-CM

## 2019-11-30 NOTE — Progress Notes (Signed)
39 y.o. G52P1001 Married  Caucasian Fe here for annual exam. Vaginal dryness better and uses vaginal lubrication as needed. Facial and left arm numbness improved with Neurology follow up. Sees PCP for medication management of Adderall, Ambien, and Buspar, working well. Getting exercise in gym and trying to eat well.  No LMP recorded. Patient has had a hysterectomy.          Sexually active: Yes.    The current method of family planning is hysterectomy (supracervical).    Exercising: Yes.    gym Smoker:  no  Review of Systems  Constitutional: Negative.   HENT: Negative.   Eyes: Negative.   Respiratory: Negative.   Cardiovascular: Negative.   Gastrointestinal: Negative.   Genitourinary: Negative.   Musculoskeletal: Negative.   Skin: Negative.   Neurological: Negative.   Endo/Heme/Allergies: Negative.   Psychiatric/Behavioral: Negative.     Health Maintenance: Pap:  11-18-17 neg HPV HR neg History of Abnormal Pap: no MMG:  06-11-2019 category b density birads 1:neg Self Breast exams: no Colonoscopy:  none BMD:   none TDaP:  2013 Shingles: no Pneumonia: no Hep C and HIV: not done Labs: if needed, PCP does   reports that she quit smoking about 18 years ago. She has never used smokeless tobacco. She reports current alcohol use. She reports that she does not use drugs.  Past Medical History:  Diagnosis Date  . Anxiety   . Depression   . Fibroid   . Gastroschisis     Past Surgical History:  Procedure Laterality Date  . ABDOMINAL HYSTERECTOMY     supracervical hysterectomy  . ABDOMINAL SURGERY    . OOPHORECTOMY      Current Outpatient Medications  Medication Sig Dispense Refill  . amphetamine-dextroamphetamine (ADDERALL) 20 MG tablet Take 1 tablet (20 mg total) by mouth daily. 30 tablet 0  . busPIRone (BUSPAR) 10 MG tablet Take 1 tablet (10 mg total) by mouth 2 (two) times daily. 180 tablet 1  . vortioxetine HBr (TRINTELLIX) 20 MG TABS tablet Take 20 mg by mouth daily.     Marland Kitchen zolpidem (AMBIEN) 5 MG tablet Take 5 mg by mouth as needed.      No current facility-administered medications for this visit.    Family History  Problem Relation Age of Onset  . Uterine cancer Mother   . Diabetes Mother   . Hypertension Mother   . Thyroid disease Mother   . Skin cancer Father   . Diabetes Maternal Grandmother   . Hypertension Maternal Grandmother   . Diabetes Paternal Grandfather   . Breast cancer Neg Hx     ROS:  Pertinent items are noted in HPI.  Otherwise, a comprehensive ROS was negative.  Exam:   BP 118/78   Pulse 72   Temp 98.7 F (37.1 C) (Skin)   Resp 16   Ht 5' 4.75" (1.645 m)   Wt 165 lb (74.8 kg)   BMI 27.67 kg/m  Height: 5' 4.75" (164.5 cm) Ht Readings from Last 3 Encounters:  11/30/19 5' 4.75" (1.645 m)  07/30/19 5\' 5"  (1.651 m)  12/12/18 5' 4.75" (1.645 m)    General appearance: alert, cooperative and appears stated age Head: Normocephalic, without obvious abnormality, atraumatic Neck: no adenopathy, supple, symmetrical, trachea midline and thyroid normal to inspection and palpation Lungs: clear to auscultation bilaterally Breasts: normal appearance, no masses or tenderness, No nipple retraction or dimpling, No nipple discharge or bleeding, No axillary or supraclavicular adenopathy Heart: regular rate and rhythm Abdomen: soft,  non-tender; no masses,  no organomegaly Extremities: extremities normal, atraumatic, no cyanosis or edema Skin: Skin color, texture, turgor normal. No rashes or lesions Lymph nodes: Cervical, supraclavicular, and axillary nodes normal. No abnormal inguinal nodes palpated Neurologic: Grossly normal   Pelvic: External genitalia:  no lesions              Urethra:  normal appearing urethra with no masses, tenderness or lesions              Bartholin's and Skene's: normal                 Vagina: normal appearing vagina with normal color and discharge, no lesions              Cervix: no cervical motion  tenderness, no lesions and supracervical              Pap taken: No. Bimanual Exam:  Uterus:  uterus absent              Adnexa: no mass, fullness, tenderness adnexa surgically absent               Rectovaginal: Confirms               Anus:  normal sphincter tone, no lesions  Chaperone present: yes  A:  Well Woman with normal exam  S/P TAH supracervical, with bilateral oophorectomy due to fibroids and pain.   Maternal history of uterine cancer(mother)  Anxiety/depression management with MD  P:   Reviewed health and wellness pertinent to exam  Aware to advise if vaginal dryness issues or bleeding  Continue follow up with PCp as indicated  Pap smear: no  counseled on breast self exam, mammography screening, feminine hygiene, adequate intake of calcium and vitamin D, diet and exercise  return annually or prn  An After Visit Summary was printed and given to the patient.

## 2019-11-30 NOTE — Patient Instructions (Signed)

## 2019-12-18 ENCOUNTER — Other Ambulatory Visit: Payer: Self-pay

## 2019-12-18 MED ORDER — AMPHETAMINE-DEXTROAMPHETAMINE 20 MG PO TABS: 20.0000 mg | ORAL_TABLET | Freq: Every day | ORAL | 0 refills | Status: DC

## 2019-12-18 MED ORDER — VORTIOXETINE HBR 20 MG PO TABS: 20.0000 mg | ORAL_TABLET | Freq: Every day | ORAL | 1 refills | Status: DC

## 2019-12-18 MED FILL — TRINTELLIX 20 MG TABLET: 20 | 90 days supply | Qty: 90 | Fill #0

## 2019-12-19 MED FILL — AMPHETAMINE-DEXTROAMPHETAMI: 20 | 30 days supply | Qty: 30 | Fill #0

## 2019-12-27 ENCOUNTER — Ambulatory Visit (INDEPENDENT_AMBULATORY_CARE_PROVIDER_SITE_OTHER): Payer: No Typology Code available for payment source | Admitting: Family Medicine

## 2019-12-27 VITALS — BP 126/76 | HR 87 | Temp 97.7°F | Ht 65.0 in | Wt 166.2 lb

## 2019-12-27 DIAGNOSIS — F988 Other specified behavioral and emotional disorders with onset usually occurring in childhood and adolescence: Secondary | ICD-10-CM

## 2019-12-27 DIAGNOSIS — F411 Generalized anxiety disorder: Secondary | ICD-10-CM

## 2019-12-27 DIAGNOSIS — R400 Somnolence: Secondary | ICD-10-CM

## 2019-12-27 MED ORDER — AMPHETAMINE-DEXTROAMPHET ER 20 MG PO CP24: 20.0000 mg | ORAL_CAPSULE | ORAL | 0 refills | Status: DC

## 2019-12-27 MED ORDER — BUSPIRONE HCL 15 MG PO TABS: 15.0000 mg | ORAL_TABLET | Freq: Two times a day (BID) | ORAL | 0 refills | Status: DC

## 2019-12-27 MED FILL — ADDERALL XR 20 MG CAP SA: 20 | 30 days supply | Qty: 30 | Fill #0

## 2019-12-27 NOTE — Progress Notes (Signed)
Established Patient Office Visit  Subjective:  Patient ID: Sheri Moore, female    DOB: [birth year]  Age: 39 y.o. MRN: [id number]  CC:  Chief Complaint  Patient presents with  . Difficulty concentrating    Would like to discuss possibly increasing her adderall    HPI Sheri Moore presents with increased anxiety and irritability. No increased depression. Dr. Felecia Shelling started adderal and increased dose to 20 mg once daily. Feels like it was working until changed from capsule to tablet recently.   Past Medical History:  Diagnosis Date  . Anxiety   . Depression   . Fibroid   . Gastroschisis     Past Surgical History:  Procedure Laterality Date  . ABDOMINAL HYSTERECTOMY     supracervical hysterectomy  . ABDOMINAL SURGERY    . OOPHORECTOMY      Family History  Problem Relation Age of Onset  . Uterine cancer Mother   . Diabetes Mother   . Hypertension Mother   . Thyroid disease Mother   . Skin cancer Father   . Diabetes Maternal Grandmother   . Hypertension Maternal Grandmother   . Diabetes Paternal Grandfather   . Breast cancer Neg Hx     Social History   Socioeconomic History  . Marital status: Married    Spouse name: Not on file  . Number of children: 1  . Years of education: College  . Highest education level: Not on file  Occupational History  . Occupation: [employer]  Tobacco Use  . Smoking status: Former Smoker    Quit date: 10/11/2001    Years since quitting: 18.2  . Smokeless tobacco: Never Used  Substance and Sexual Activity  . Alcohol use: Yes    Comment: occ  . Drug use: No  . Sexual activity: Yes    Partners: Female    Birth control/protection: Surgical    Comment: supracervical hysterectomy  Other Topics Concern  . Not on file  Social History Narrative   Lives with husband and son   Caffeine use: rarely   Right handed    Social Determinants of Health   Financial Resource Strain:   . Difficulty of Paying Living Expenses:    Food Insecurity:   . Worried About Charity fundraiser in the Last Year:   . Arboriculturist in the Last Year:   Transportation Needs:   . Film/video editor (Medical):   Marland Kitchen Lack of Transportation (Non-Medical):   Physical Activity:   . Days of Exercise per Week:   . Minutes of Exercise per Session:   Stress:   . Feeling of Stress :   Social Connections:   . Frequency of Communication with Friends and Family:   . Frequency of Social Gatherings with Friends and Family:   . Attends Religious Services:   . Active Member of Clubs or Organizations:   . Attends Archivist Meetings:   Marland Kitchen Marital Status:   Intimate Partner Violence:   . Fear of Current or Ex-Partner:   . Emotionally Abused:   Marland Kitchen Physically Abused:   . Sexually Abused:     Outpatient Medications Prior to Visit  Medication Sig Dispense Refill  . LORazepam (ATIVAN) 0.5 MG tablet Take 0.5 mg by mouth every 8 (eight) hours.    . vortioxetine HBr (TRINTELLIX) 20 MG TABS tablet Take 1 tablet (20 mg total) by mouth daily. 90 tablet 1  . zolpidem (AMBIEN) 5 MG tablet Take 5 mg by  mouth as needed.     Marland Kitchen amphetamine-dextroamphetamine (ADDERALL) 20 MG tablet Take 1 tablet (20 mg total) by mouth daily. 30 tablet 0  . busPIRone (BUSPAR) 10 MG tablet Take 1 tablet (10 mg total) by mouth 2 (two) times daily. 180 tablet 1   No facility-administered medications prior to visit.    No Known Allergies  ROS Review of Systems  Constitutional: Negative for chills, fatigue and fever.  HENT: Negative for congestion, ear pain, rhinorrhea and sore throat.   Respiratory: Negative for cough and shortness of breath.   Cardiovascular: Negative for chest pain.  Gastrointestinal: Negative for abdominal pain, constipation, diarrhea, nausea and vomiting.  Genitourinary: Negative for dysuria and urgency.  Musculoskeletal: Negative for back pain and myalgias.  Neurological: Positive for dizziness. Negative for weakness,  light-headedness and headaches.  Psychiatric/Behavioral: Positive for agitation, decreased concentration and sleep disturbance. Negative for dysphoric mood. The patient is nervous/anxious.        Forgetful      Objective:    Physical Exam  Constitutional: She appears well-developed and well-nourished.  Cardiovascular: Normal rate, regular rhythm and normal heart sounds.  Pulmonary/Chest: Effort normal and breath sounds normal.  Neurological: She is alert.  Psychiatric: Her behavior is normal.  Appears anxious and depressed.    BP 126/76 (BP Location: Right Arm, Patient Position: Sitting)   Pulse 87   Temp 97.7 F (36.5 C)   Ht 5\' 5"  (1.651 m)   Wt 166 lb 3.2 oz (75.4 kg)   SpO2 97%   BMI 27.66 kg/m  Wt Readings from Last 3 Encounters:  12/27/19 166 lb 3.2 oz (75.4 kg)  11/30/19 165 lb (74.8 kg)  07/30/19 163 lb (73.9 kg)     Health Maintenance Due  Topic Date Due  . HIV Screening  Never done  . TETANUS/TDAP  Never done    There are no preventive care reminders to display for this patient.  Lab Results  Component Value Date   TSH 0.911 11/24/2018   Lab Results  Component Value Date   WBC 6.2 11/24/2018   HGB 14.2 11/24/2018   HCT 42.1 11/24/2018   MCV 86 11/24/2018   PLT 322 11/24/2018   Lab Results  Component Value Date   NA 142 11/24/2018   K 4.3 11/24/2018   CO2 27 11/24/2018   GLUCOSE 88 11/24/2018   BUN 9 11/24/2018   CREATININE 0.64 11/24/2018   BILITOT 0.6 11/24/2018   ALKPHOS 72 11/24/2018   AST 21 11/24/2018   ALT 34 (H) 11/24/2018   PROT 6.8 11/24/2018   ALBUMIN 4.7 11/24/2018   CALCIUM 9.6 11/24/2018   Lab Results  Component Value Date   CHOL 212 (H) 11/24/2018   Lab Results  Component Value Date   HDL 50 11/24/2018   Lab Results  Component Value Date   LDLCALC 144 (H) 11/24/2018   Lab Results  Component Value Date   TRIG 91 11/24/2018   Lab Results  Component Value Date   CHOLHDL 4.2 11/24/2018   No results found for:  HGBA1C    Assessment & Plan:  1. Somnolence, daytime Continue adderal at current dose of 20 mg once daily in am, but ordered Brand Name only to see if this would make a difference. If not will, consider increase dose.   2. Attention deficit disorder (ADD) without hyperactivity See above.   3. GAD (generalized anxiety disorder) Increase buspirone to 15 mg one twice a day,.    Meds ordered this encounter  Medications  . amphetamine-dextroamphetamine (ADDERALL XR) 20 MG 24 hr capsule    Sig: Take 1 capsule (20 mg total) by mouth every morning.    Dispense:  30 capsule    Refill:  0    Brand name only.  . busPIRone (BUSPAR) 15 MG tablet    Sig: Take 1 tablet (15 mg total) by mouth 2 (two) times daily.    Dispense:  180 tablet    Refill:  0    Follow-up: Return if symptoms worsen or fail to improve.    Rochel Brome, MD

## 2019-12-28 ENCOUNTER — Encounter: Payer: Self-pay | Admitting: Certified Nurse Midwife

## 2020-01-09 ENCOUNTER — Encounter: Payer: Self-pay | Admitting: Family Medicine

## 2020-01-09 DIAGNOSIS — F988 Other specified behavioral and emotional disorders with onset usually occurring in childhood and adolescence: Secondary | ICD-10-CM | POA: Insufficient documentation

## 2020-01-09 DIAGNOSIS — R400 Somnolence: Secondary | ICD-10-CM | POA: Insufficient documentation

## 2020-01-09 DIAGNOSIS — F411 Generalized anxiety disorder: Secondary | ICD-10-CM | POA: Insufficient documentation

## 2020-01-28 ENCOUNTER — Encounter: Payer: Self-pay | Admitting: Family Medicine

## 2020-01-28 ENCOUNTER — Ambulatory Visit: Payer: No Typology Code available for payment source | Admitting: Family Medicine

## 2020-01-28 ENCOUNTER — Other Ambulatory Visit: Payer: Self-pay

## 2020-01-28 VITALS — BP 132/86 | HR 82 | Temp 97.6°F | Ht 65.0 in | Wt 171.8 lb

## 2020-01-28 DIAGNOSIS — R5383 Other fatigue: Secondary | ICD-10-CM | POA: Diagnosis not present

## 2020-01-28 DIAGNOSIS — F418 Other specified anxiety disorders: Secondary | ICD-10-CM | POA: Diagnosis not present

## 2020-01-28 DIAGNOSIS — R9082 White matter disease, unspecified: Secondary | ICD-10-CM

## 2020-01-28 NOTE — Progress Notes (Signed)
I have read the note, and I agree with the clinical assessment and plan.  Tamecia Mcdougald A. Jane Broughton, MD, PhD, FAAN Certified in Neurology, Clinical Neurophysiology, Sleep Medicine, Pain Medicine and Neuroimaging  [facility] [address], [address] [place], [place] [address] [phone]  

## 2020-01-28 NOTE — Patient Instructions (Signed)
Continue current treatment plan   Consider testing for sleep apnea.   Consider neurocognitive testing   Work on healthy lifestyle habits with low glycemic diet and regular exercise. Stay well hydrated.   Follow up as needed   Sleep Apnea Sleep apnea affects breathing during sleep. It causes breathing to stop for a short time or to become shallow. It can also increase the risk of:  Heart attack.  Stroke.  Being very overweight (obese).  Diabetes.  Heart failure.  Irregular heartbeat. The goal of treatment is to help you breathe normally again. What are the causes? There are three kinds of sleep apnea:  Obstructive sleep apnea. This is caused by a blocked or collapsed airway.  Central sleep apnea. This happens when the brain does not send the right signals to the muscles that control breathing.  Mixed sleep apnea. This is a combination of obstructive and central sleep apnea. The most common cause of this condition is a collapsed or blocked airway. This can happen if:  Your throat muscles are too relaxed.  Your tongue and tonsils are too large.  You are overweight.  Your airway is too small. What increases the risk?  Being overweight.  Smoking.  Having a small airway.  Being older.  Being female.  Drinking alcohol.  Taking medicines to calm yourself (sedatives or tranquilizers).  Having family members with the condition. What are the signs or symptoms?  Trouble staying asleep.  Being sleepy or tired during the day.  Getting angry a lot.  Loud snoring.  Headaches in the morning.  Not being able to focus your mind (concentrate).  Forgetting things.  Less interest in sex.  Mood swings.  Personality changes.  Feelings of sadness (depression).  Waking up a lot during the night to pee (urinate).  Dry mouth.  Sore throat. How is this diagnosed?  Your medical history.  A physical exam.  A test that is done when you are sleeping  (sleep study). The test is most often done in a sleep lab but may also be done at home. How is this treated?   Sleeping on your side.  Using a medicine to get rid of mucus in your nose (decongestant).  Avoiding the use of alcohol, medicines to help you relax, or certain pain medicines (narcotics).  Losing weight, if needed.  Changing your diet.  Not smoking.  Using a machine to open your airway while you sleep, such as: ? An oral appliance. This is a mouthpiece that shifts your lower jaw forward. ? A CPAP device. This device blows air through a mask when you breathe out (exhale). ? An EPAP device. This has valves that you put in each nostril. ? A BPAP device. This device blows air through a mask when you breathe in (inhale) and breathe out.  Having surgery if other treatments do not work. It is important to get treatment for sleep apnea. Without treatment, it can lead to:  High blood pressure.  Coronary artery disease.  In men, not being able to have an erection (impotence).  Reduced thinking ability. Follow these instructions at home: Lifestyle  Make changes that your doctor recommends.  Eat a healthy diet.  Lose weight if needed.  Avoid alcohol, medicines to help you relax, and some pain medicines.  Do not use any products that contain nicotine or tobacco, such as cigarettes, e-cigarettes, and chewing tobacco. If you need help quitting, ask your doctor. General instructions  Take over-the-counter and prescription medicines only  as told by your doctor.  If you were given a machine to use while you sleep, use it only as told by your doctor.  If you are having surgery, make sure to tell your doctor you have sleep apnea. You may need to bring your device with you.  Keep all follow-up visits as told by your doctor. This is important. Contact a doctor if:  The machine that you were given to use during sleep bothers you or does not seem to be working.  You do not  get better.  You get worse. Get help right away if:  Your chest hurts.  You have trouble breathing in enough air.  You have an uncomfortable feeling in your back, arms, or stomach.  You have trouble talking.  One side of your body feels weak.  A part of your face is hanging down. These symptoms may be an emergency. Do not wait to see if the symptoms will go away. Get medical help right away. Call your local emergency services (911 in the [place] Do not drive yourself to the hospital. Summary  This condition affects breathing during sleep.  The most common cause is a collapsed or blocked airway.  The goal of treatment is to help you breathe normally while you sleep. This information is not intended to replace advice given to you by your health care provider. Make sure you discuss any questions you have with your health care provider. Document Revised: 07/14/2018 Document Reviewed: 05/23/2018 Elsevier Patient Education  [place].

## 2020-01-28 NOTE — Progress Notes (Signed)
PATIENT: Sheri Moore DOB: 08-Nov-1980  REASON FOR VISIT: follow up HISTORY FROM: patient  Chief Complaint  Patient presents with  . Follow-up    Rm go over MRI results.      HISTORY OF PRESENT ILLNESS: Today 01/28/20 Sheri Moore is a 39 y.o. female here today for follow up. She had an abnormal MRI in 10/2018 showing progression of white matter changes. Patient has had LPx2 in the past with no oligoclonal bands and no clinical progression for over a decade. MS diagnosis thought to be less likely. She has continued to see [place] for concerns of fatigue and cognitive changes. She has tried and failed armodafinil. She was started on Adderall XR 20mg  daily in October. She feels that this has helped with fatigue and brain fog. PCP has continue refills and also continues Trintellix and Buspar. She feels that mood is fairly stable. She does continue to have intermittent vertigo and brain fog. 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 She feels that it is difficult for her to remember details associated with patents. She gives an example of calling a patient with labs. She asked that he come in for additional workup. When the patient arrived to the office, later in the day, she could not remember what labs he needed. She does use memory compensation strategies like keeping notes of important details. She is able to complete tasks at work without difficulty. She has a active lifestyle.   She does feel that she sleeps well on Ambien. She does snore and was recently told by her husband that she will occasionally gasp for air. This does not seem to be a persistent concern. She has had a HST that showed mild sleep apnea. She has not used CPAP in the past. She denies excessive weight gain. She does occasionally have morning headaches. She does wake with dry mouth on occasion. She denies excessive daytime sleepiness. No naps. Previous ESS was 3/24.   HISTORY: (copied from Dr Garth Bigness note on  07/30/2019)  Sheri Moore is a 40 y.o. woman with numbness, fatigue, cognitive changes and abnormal brain MRI.  Update 07/30/2019: She is reporting more trouble with her focus and memory and feels she is distractible - brain fog in general.  She is on armodafinil 250 mg.  It seemed to help a little bit at first but even  A higher dose is not helping now.    She did not have ADD when younger.      She also reports her arms feel heavy.  This is more symmetric than previously when the left was affected more than the right..  Gait is fine.   She gets some intermittent dysesthesias but  She sleeps well on Ambien.   She feels refreshed when she wakes up.   She is tired but not sleepy and does not doze off much.    Mood is doing ok in general.    She is on Trintellix (Dr. Tobie Poet writes).   Her ferritin was high and she was found to be a carrier for hemochromatosis.     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  MRI was abnl in 2004 and only minimal progression over the years.  EPWORTH SLEEPINESS SCALE  On a scale of 0 - 3 what is the chance of dozing:  Sitting and Reading:  0 Watching TV:                                      0 Sitting inactive in a public place:       0 Passenger in car for one hour:          0 Lying down to rest in the afternoon:  3 Sitting and talking to someone:         0 Sitting quietly after lunch:                  0 In a car, stopped in traffic:                 0  Total (out of 24):   3/24 (normal)    REVIEW OF SYSTEMS: Out of a complete 14 system review of symptoms, the patient complains only of the following symptoms, brain fog, depression, anxiety, fatigue and all other reviewed systems are negative.  ALLERGIES: No Known Allergies  HOME MEDICATIONS: Outpatient Medications Prior to Visit  Medication Sig Dispense Refill  . amphetamine-dextroamphetamine (ADDERALL  XR) 20 MG 24 hr capsule Take 1 capsule (20 mg total) by mouth every morning. 30 capsule 0  . busPIRone (BUSPAR) 15 MG tablet Take 1 tablet (15 mg total) by mouth 2 (two) times daily. 180 tablet 0  . LORazepam (ATIVAN) 0.5 MG tablet Take 0.5 mg by mouth every 8 (eight) hours.    . vortioxetine HBr (TRINTELLIX) 20 MG TABS tablet Take 1 tablet (20 mg total) by mouth daily. 90 tablet 1  . zolpidem (AMBIEN) 5 MG tablet Take 5 mg by mouth as needed.      No facility-administered medications prior to visit.    PAST MEDICAL HISTORY: Past Medical History:  Diagnosis Date  . Anxiety   . Depression   . Fibroid   . Gastroschisis     PAST SURGICAL HISTORY: Past Surgical History:  Procedure Laterality Date  . ABDOMINAL HYSTERECTOMY     supracervical hysterectomy  . ABDOMINAL SURGERY    . OOPHORECTOMY      FAMILY HISTORY: Family History  Problem Relation Age of Onset  . Uterine cancer Mother   . Diabetes Mother   . Hypertension Mother   . Thyroid disease Mother   . Skin cancer Father   . Diabetes Maternal Grandmother   . Hypertension Maternal Grandmother   . Diabetes Paternal Grandfather   . Breast cancer Neg Hx     SOCIAL HISTORY: Social History   Socioeconomic History  . Marital status: Married    Spouse name: Not on file  . Number of children: 1  . Years of education: College  . Highest education level: Not on file  Occupational History  . Occupation: [employer]  Tobacco Use  . Smoking status: Former Smoker    Quit date: 10/11/2001    Years since quitting: 18.3  . Smokeless tobacco: Never Used  Substance and Sexual Activity  . Alcohol use: Yes    Comment: occ  . Drug use: No  . Sexual activity: Yes    Partners: Female    Birth control/protection: Surgical    Comment: supracervical hysterectomy  Other Topics Concern  . Not on file  Social History Narrative   Lives with husband and son   Caffeine use: rarely   Right handed    Social Determinants of  Health  Financial Resource Strain:   . Difficulty of Paying Living Expenses:   Food Insecurity:   . Worried About Charity fundraiser in the Last Year:   . Arboriculturist in the Last Year:   Transportation Needs:   . Film/video editor (Medical):   Marland Kitchen Lack of Transportation (Non-Medical):   Physical Activity:   . Days of Exercise per Week:   . Minutes of Exercise per Session:   Stress:   . Feeling of Stress :   Social Connections:   . Frequency of Communication with Friends and Family:   . Frequency of Social Gatherings with Friends and Family:   . Attends Religious Services:   . Active Member of Clubs or Organizations:   . Attends Archivist Meetings:   Marland Kitchen Marital Status:   Intimate Partner Violence:   . Fear of Current or Ex-Partner:   . Emotionally Abused:   Marland Kitchen Physically Abused:   . Sexually Abused:       PHYSICAL EXAM  Vitals:   01/28/20 1239  BP: 132/86  Pulse: 82  Temp: 97.6 F (36.4 C)  Weight: 171 lb 12.8 oz (77.9 kg)  Height: 5\' 5"  (1.651 m)   Body mass index is 28.59 kg/m.  Generalized: Well developed, in no acute distress  Cardiology: normal rate and rhythm, no murmur noted Respiratory: clear to auscultation bilaterally  Neurological examination  Mentation: Alert oriented to time, place, history taking. Follows all commands speech and language fluent Cranial nerve II-XII: Pupils were equal round reactive to light. Extraocular movements were full, visual field were full on confrontational test. Facial sensation and strength were normal. Uvula tongue midline. Head turning and shoulder shrug  were normal and symmetric. Motor: The motor testing reveals 5 over 5 strength of all 4 extremities. Good symmetric motor tone is noted throughout.  Sensory: Sensory testing is intact to soft touch on all 4 extremities. No evidence of extinction is noted.  Coordination: Cerebellar testing reveals good finger-nose-finger and heel-to-shin bilaterally.   Gait and station: Gait is normal.   DIAGNOSTIC DATA (LABS, IMAGING, TESTING) - I reviewed patient records, labs, notes, testing and imaging myself where available.  No flowsheet data found.   Lab Results  Component Value Date   WBC 6.2 11/24/2018   HGB 14.2 11/24/2018   HCT 42.1 11/24/2018   MCV 86 11/24/2018   PLT 322 11/24/2018      Component Value Date/Time   NA 142 11/24/2018 1340   K 4.3 11/24/2018 1340   CL 99 11/24/2018 1340   CO2 27 11/24/2018 1340   GLUCOSE 88 11/24/2018 1340   BUN 9 11/24/2018 1340   CREATININE 0.64 11/24/2018 1340   CALCIUM 9.6 11/24/2018 1340   PROT 6.8 11/24/2018 1340   ALBUMIN 4.7 11/24/2018 1340   AST 21 11/24/2018 1340   ALT 34 (H) 11/24/2018 1340   ALKPHOS 72 11/24/2018 1340   BILITOT 0.6 11/24/2018 1340   GFRNONAA 113 11/24/2018 1340   GFRAA 130 11/24/2018 1340   Lab Results  Component Value Date   CHOL 212 (H) 11/24/2018   HDL 50 11/24/2018   LDLCALC 144 (H) 11/24/2018   TRIG 91 11/24/2018   CHOLHDL 4.2 11/24/2018   No results found for: HGBA1C No results found for: VITAMINB12 Lab Results  Component Value Date   TSH 0.911 11/24/2018     ASSESSMENT AND PLAN 39 y.o. year old female  has a past medical history of Anxiety, Depression, Fibroid, and Gastroschisis. here  with     ICD-10-CM   1. White matter abnormality on MRI of brain  R90.82   2. Other fatigue  R53.83   3. Depression with anxiety  F41.8     [name] reports some improvement in fatigue an inattention since starting Adderall XR 20mg  daily. She also continues Trintellix and Buspar. She feels that mood is fairly stable. PCP continues prescriptions. We have discussed intermittent concerns of vertigo. Symptoms could be related to vestibular migraines, although not clear at this time. No vision changes, persistent headaches, changes in gait or bladder/bowel habits. No numbness or tingling. We have discussed possible sleep apnea. She reports having a previous home sleep  study that indicated mild obstructive sleep apnea.  CPAP therapy was not initiated.  Weight has been stable since.  Epworth Sleepiness Scale was normal at last visit.  I have provided her with additional educational materials related to sleep apnea.  She will monitor symptoms closely call with any request to repeat sleep study.  We have also discussed considering neurocognitive testing.  Neuro exam is intact I do not suspect neurodegenerative causes of memory loss, however, could consider neurocognitive testing for reassurance.  She will continue close follow-up with primary care.  She will follow-up with me or Dr. Ladonna Snide as needed.  She verbalizes understanding and agreement with this plan.    No orders of the defined types were placed in this encounter.    No orders of the defined types were placed in this encounter.     I spent 25 minutes with the patient. 50% of this time was spent counseling and educating patient on plan of care and medications.    Debbora Presto, FNP-C 01/28/2020, 12:50 PM [facility] [address], [place], [place] [address] [phone]

## 2020-01-30 ENCOUNTER — Other Ambulatory Visit: Payer: Self-pay

## 2020-01-30 NOTE — Progress Notes (Unsigned)
refill 

## 2020-02-01 ENCOUNTER — Other Ambulatory Visit: Payer: Self-pay

## 2020-02-04 ENCOUNTER — Other Ambulatory Visit: Payer: Self-pay

## 2020-02-04 MED ORDER — ZOLPIDEM TARTRATE 5 MG PO TABS: 5.0000 mg | ORAL_TABLET | ORAL | 5 refills | Status: DC | PRN

## 2020-02-04 MED ORDER — AMPHETAMINE-DEXTROAMPHET ER 20 MG PO CP24: 20.0000 mg | ORAL_CAPSULE | ORAL | 0 refills | Status: DC

## 2020-02-04 MED FILL — ZOLPIDEM TARTRATE 5 MG TAB: 5 | 30 days supply | Qty: 30 | Fill #0

## 2020-02-04 MED FILL — ADDERALL XR 20 MG CAP SA: 20 | 30 days supply | Qty: 30 | Fill #0

## 2020-02-21 ENCOUNTER — Other Ambulatory Visit: Payer: Self-pay | Admitting: *Deleted

## 2020-02-21 ENCOUNTER — Other Ambulatory Visit: Payer: Self-pay | Admitting: Family Medicine

## 2020-02-21 DIAGNOSIS — Z1231 Encounter for screening mammogram for malignant neoplasm of breast: Secondary | ICD-10-CM

## 2020-03-05 ENCOUNTER — Other Ambulatory Visit: Payer: Self-pay | Admitting: Family Medicine

## 2020-03-05 MED ORDER — AMPHETAMINE-DEXTROAMPHET ER 20 MG PO CP24: 20.0000 mg | ORAL_CAPSULE | ORAL | 0 refills | Status: DC

## 2020-03-05 MED FILL — ADDERALL XR 20 MG CAP SA: 20 | 30 days supply | Qty: 30 | Fill #0

## 2020-03-05 MED FILL — ZOLPIDEM TARTRATE 5 MG TAB: 5 | 30 days supply | Qty: 30 | Fill #1

## 2020-05-27 ENCOUNTER — Other Ambulatory Visit: Payer: Self-pay | Admitting: Family Medicine

## 2020-05-27 DIAGNOSIS — Z0001 Encounter for general adult medical examination with abnormal findings: Secondary | ICD-10-CM

## 2020-05-27 DIAGNOSIS — R5383 Other fatigue: Secondary | ICD-10-CM

## 2020-05-28 ENCOUNTER — Other Ambulatory Visit: Payer: Self-pay

## 2020-05-28 ENCOUNTER — Ambulatory Visit (INDEPENDENT_AMBULATORY_CARE_PROVIDER_SITE_OTHER): Payer: No Typology Code available for payment source | Admitting: Family Medicine

## 2020-05-28 ENCOUNTER — Encounter: Payer: Self-pay | Admitting: Family Medicine

## 2020-05-28 VITALS — BP 138/90 | HR 85 | Temp 97.7°F | Ht 65.0 in | Wt 176.4 lb

## 2020-05-28 DIAGNOSIS — F411 Generalized anxiety disorder: Secondary | ICD-10-CM

## 2020-05-28 DIAGNOSIS — F988 Other specified behavioral and emotional disorders with onset usually occurring in childhood and adolescence: Secondary | ICD-10-CM | POA: Diagnosis not present

## 2020-05-28 DIAGNOSIS — E663 Overweight: Secondary | ICD-10-CM | POA: Diagnosis not present

## 2020-05-28 DIAGNOSIS — Z6829 Body mass index (BMI) 29.0-29.9, adult: Secondary | ICD-10-CM

## 2020-05-28 DIAGNOSIS — E782 Mixed hyperlipidemia: Secondary | ICD-10-CM | POA: Diagnosis not present

## 2020-05-28 LAB — COMPREHENSIVE METABOLIC PANEL
ALT: 45 IU/L — ABNORMAL HIGH (ref 0–32)
AST: 31 IU/L (ref 0–40)
Albumin/Globulin Ratio: 1.8 (ref 1.2–2.2)
Albumin: 4.2 g/dL (ref 3.8–4.8)
Alkaline Phosphatase: 88 IU/L (ref 48–121)
BUN/Creatinine Ratio: 23 (ref 9–23)
BUN: 16 mg/dL (ref 6–24)
Bilirubin Total: 0.4 mg/dL (ref 0.0–1.2)
CO2: 26 mmol/L (ref 20–29)
Calcium: 9.3 mg/dL (ref 8.7–10.2)
Chloride: 101 mmol/L (ref 96–106)
Creatinine, Ser: 0.71 mg/dL (ref 0.57–1.00)
GFR calc Af Amer: 123 mL/min/{1.73_m2} (ref >59)
GFR calc non Af Amer: 107 mL/min/{1.73_m2} (ref >59)
Globulin, Total: 2.3 g/dL (ref 1.5–4.5)
Glucose: 104 mg/dL — ABNORMAL HIGH (ref 65–99)
Potassium: 4.6 mmol/L (ref 3.5–5.2)
Sodium: 139 mmol/L (ref 134–144)
Total Protein: 6.5 g/dL (ref 6.0–8.5)

## 2020-05-28 LAB — LIPID PANEL
Chol/HDL Ratio: 4.1 ratio (ref 0.0–4.4)
Cholesterol, Total: 211 mg/dL — ABNORMAL HIGH (ref 100–199)
HDL: 51 mg/dL (ref >39)
LDL Chol Calc (NIH): 127 mg/dL — ABNORMAL HIGH (ref 0–99)
Triglycerides: 185 mg/dL — ABNORMAL HIGH (ref 0–149)
VLDL Cholesterol Cal: 33 mg/dL (ref 5–40)

## 2020-05-28 LAB — CBC WITH DIFFERENTIAL/PLATELET
Basophils Absolute: 0 10*3/uL (ref 0.0–0.2)
Basos: 1 %
EOS (ABSOLUTE): 0.1 10*3/uL (ref 0.0–0.4)
Eos: 2 %
Hematocrit: 42 % (ref 34.0–46.6)
Hemoglobin: 14.5 g/dL (ref 11.1–15.9)
Immature Grans (Abs): 0 10*3/uL (ref 0.0–0.1)
Immature Granulocytes: 0 %
Lymphocytes Absolute: 1.6 10*3/uL (ref 0.7–3.1)
Lymphs: 24 %
MCH: 30 pg (ref 26.6–33.0)
MCHC: 34.5 g/dL (ref 31.5–35.7)
MCV: 87 fL (ref 79–97)
Monocytes Absolute: 0.4 10*3/uL (ref 0.1–0.9)
Monocytes: 6 %
Neutrophils Absolute: 4.6 10*3/uL (ref 1.4–7.0)
Neutrophils: 67 %
Platelets: 278 10*3/uL (ref 150–450)
RBC: 4.83 x10E6/uL (ref 3.77–5.28)
RDW: 12.7 % (ref 11.7–15.4)
WBC: 6.7 10*3/uL (ref 3.4–10.8)

## 2020-05-28 LAB — CARDIOVASCULAR RISK ASSESSMENT

## 2020-05-28 LAB — TSH: TSH: 2.46 u[IU]/mL (ref 0.450–4.500)

## 2020-05-28 MED ORDER — LORAZEPAM 0.5 MG PO TABS: 0.5000 mg | ORAL_TABLET | Freq: Three times a day (TID) | ORAL | 1 refills | Status: DC

## 2020-05-28 MED ORDER — ADDERALL XR 20 MG PO CP24: 20.0000 mg | ORAL_CAPSULE | Freq: Every day | ORAL | 0 refills | Status: DC

## 2020-05-28 MED FILL — LORazepam 0.5 MG TABS: 0.5 | 10 days supply | Qty: 30 | Fill #0

## 2020-05-28 MED FILL — ADDERALL XR 20 MG CAP SA: 20 | 30 days supply | Qty: 30 | Fill #0

## 2020-05-28 NOTE — Progress Notes (Signed)
Established Patient Office Visit  Subjective:  Patient ID: Sheri Moore, female    DOB: [birth year]  Age: 39 y.o. MRN: [id number]  CC:  Chief Complaint  Patient presents with  . ADD  . Anxiety    HPI Sheri Moore presents for a follow up on anxiety.   GAD: Off trintellix x 1 1/2 months. She weaned off. No anxiety or depression.   Insomnia: Patient complains of having "brain fog" which is due to her not being able to sleep. She has been using melatonin drops vs ambien which seems to help some. She averages 7 hours of sleep on a "good night" but roughly 3 hours on a "bad night." takes [place] a couple times a week.   Hyperlipidemia.  Patient had recent blood work which showed her triglycerides were 185 and her LDL was 127.  She has fallen off her diet and exercise routine.  She wishes to continue work on diet and exercise versus trying a medication.   Past Medical History:  Diagnosis Date  . Anxiety   . Depression   . Fibroid   . Gastroschisis     Past Surgical History:  Procedure Laterality Date  . ABDOMINAL HYSTERECTOMY     supracervical hysterectomy  . ABDOMINAL SURGERY    . OOPHORECTOMY      Family History  Problem Relation Age of Onset  . Uterine cancer Mother   . Diabetes Mother   . Hypertension Mother   . Thyroid disease Mother   . Skin cancer Father   . Subarachnoid hemorrhage Father   . Diabetes Maternal Grandmother   . Hypertension Maternal Grandmother   . Diabetes Paternal Grandfather   . Breast cancer Neg Hx     Social History   Socioeconomic History  . Marital status: Married    Spouse name: Not on file  . Number of children: 1  . Years of education: College  . Highest education level: Not on file  Occupational History  . Occupation: [employer]  Tobacco Use  . Smoking status: Former Smoker    Quit date: 10/11/2001    Years since quitting: 18.6  . Smokeless tobacco: Never Used  Substance and Sexual Activity  . Alcohol use:  Yes    Comment: occ  . Drug use: No  . Sexual activity: Yes    Partners: Female    Birth control/protection: Surgical    Comment: supracervical hysterectomy  Other Topics Concern  . Not on file  Social History Narrative   Lives with husband and son   Caffeine use: rarely   Right handed    Social Determinants of Health   Financial Resource Strain:   . Difficulty of Paying Living Expenses: Not on file  Food Insecurity:   . Worried About Charity fundraiser in the Last Year: Not on file  . Ran Out of Food in the Last Year: Not on file  Transportation Needs:   . Lack of Transportation (Medical): Not on file  . Lack of Transportation (Non-Medical): Not on file  Physical Activity:   . Days of Exercise per Week: Not on file  . Minutes of Exercise per Session: Not on file  Stress:   . Feeling of Stress : Not on file  Social Connections:   . Frequency of Communication with Friends and Family: Not on file  . Frequency of Social Gatherings with Friends and Family: Not on file  . Attends Religious Services: Not on file  . Active  Member of Clubs or Organizations: Not on file  . Attends Archivist Meetings: Not on file  . Marital Status: Not on file  Intimate Partner Violence:   . Fear of Current or Ex-Partner: Not on file  . Emotionally Abused: Not on file  . Physically Abused: Not on file  . Sexually Abused: Not on file    Outpatient Medications Prior to Visit  Medication Sig Dispense Refill  . zolpidem (AMBIEN) 5 MG tablet Take 1 tablet (5 mg total) by mouth as needed. 30 tablet 5  . amphetamine-dextroamphetamine (ADDERALL XR) 20 MG 24 hr capsule Take 1 capsule (20 mg total) by mouth every morning. 30 capsule 0  . busPIRone (BUSPAR) 15 MG tablet Take 1 tablet (15 mg total) by mouth 2 (two) times daily. 180 tablet 0  . LORazepam (ATIVAN) 0.5 MG tablet Take 0.5 mg by mouth every 8 (eight) hours.    . vortioxetine HBr (TRINTELLIX) 20 MG TABS tablet Take 1 tablet (20 mg  total) by mouth daily. 90 tablet 1   No facility-administered medications prior to visit.    No Known Allergies  ROS Review of Systems  Constitutional: Positive for appetite change (emotional eating). Negative for chills, fatigue and fever.  HENT: Negative for congestion, ear pain, rhinorrhea and sore throat.   Respiratory: Negative for cough and shortness of breath.   Cardiovascular: Negative for chest pain.  Gastrointestinal: Negative for abdominal pain, constipation, diarrhea, nausea and vomiting.  Endocrine: Negative for polydipsia, polyphagia and polyuria.  Genitourinary: Negative for dysuria and urgency.  Musculoskeletal: Negative for back pain and myalgias.  Neurological: Negative for dizziness, weakness, light-headedness and headaches.  Psychiatric/Behavioral: Positive for decreased concentration and sleep disturbance. Negative for dysphoric mood. The patient is not nervous/anxious.        Forgetful      Objective:    Physical Exam Constitutional:      Appearance: Normal appearance. She is well-developed.  Cardiovascular:     Rate and Rhythm: Normal rate and regular rhythm.     Heart sounds: Normal heart sounds.  Pulmonary:     Effort: Pulmonary effort is normal.     Breath sounds: Normal breath sounds.  Neurological:     Mental Status: She is alert and oriented to person, place, and time.  Psychiatric:        Mood and Affect: Mood normal.        Behavior: Behavior normal.     Comments: Appears anxious and depressed.     BP 138/90   Pulse 85   Temp 97.7 F (36.5 C)   Ht 5\' 5"  (1.651 m)   Wt 176 lb 6.4 oz (80 kg)   SpO2 98%   BMI 29.35 kg/m  Wt Readings from Last 3 Encounters:  05/28/20 176 lb 6.4 oz (80 kg)  01/28/20 171 lb 12.8 oz (77.9 kg)  12/27/19 166 lb 3.2 oz (75.4 kg)     Health Maintenance Due  Topic Date Due  . Hepatitis C Screening  Never done  . COVID-19 Vaccine (1) Never done  . HIV Screening  Never done  . TETANUS/TDAP  Never done    . INFLUENZA VACCINE  05/11/2020    There are no preventive care reminders to display for this patient.  Lab Results  Component Value Date   TSH 2.460 05/27/2020   Lab Results  Component Value Date   WBC 6.7 05/27/2020   HGB 14.5 05/27/2020   HCT 42.0 05/27/2020   MCV 87 05/27/2020  PLT 278 05/27/2020   Lab Results  Component Value Date   NA 139 05/27/2020   K 4.6 05/27/2020   CO2 26 05/27/2020   GLUCOSE 104 (H) 05/27/2020   BUN 16 05/27/2020   CREATININE 0.71 05/27/2020   BILITOT 0.4 05/27/2020   ALKPHOS 88 05/27/2020   AST 31 05/27/2020   ALT 45 (H) 05/27/2020   PROT 6.5 05/27/2020   ALBUMIN 4.2 05/27/2020   CALCIUM 9.3 05/27/2020   Lab Results  Component Value Date   CHOL 211 (H) 05/27/2020   Lab Results  Component Value Date   HDL 51 05/27/2020   Lab Results  Component Value Date   LDLCALC 127 (H) 05/27/2020   Lab Results  Component Value Date   TRIG 185 (H) 05/27/2020   Lab Results  Component Value Date   CHOLHDL 4.1 05/27/2020   No results found for: HGBA1C    Assessment & Plan:  1. Mixed hyperlipidemia Not at goal.   Her sugar was slightly high at 104.  I would consider an A1c at her next blood work if this is still elevated. Patient prefers no medicine at this time but rather recheck in 3 months. Continue to work on eating a healthy diet and exercise.  Labs reviewed  2. Attention deficit disorder (ADD) without hyperactivity The current medical regimen is effective;  continue present plan and medications.  3. GAD (generalized anxiety disorder) Lorazepam refilled. Uses sparingly.  4. Overweight with body mass index (BMI) of 29 to 29.9 in adult Recommend continue to work on eating healthy diet and exercise.    Meds ordered this encounter  Medications  . ADDERALL XR 20 MG 24 hr capsule    Sig: Take 1 capsule (20 mg total) by mouth daily.    Dispense:  30 capsule    Refill:  0    Brand name medically necessary  . LORazepam  (ATIVAN) 0.5 MG tablet    Sig: Take 1 tablet (0.5 mg total) by mouth every 8 (eight) hours.    Dispense:  30 tablet    Refill:  1    Follow-up: Return in about 3 months (around 08/28/2020).    Rochel Brome, MD

## 2020-06-13 ENCOUNTER — Ambulatory Visit
Admission: RE | Admit: 2020-06-13 | Discharge: 2020-06-13 | Disposition: A | Discharge status: Home / Self Care | Payer: No Typology Code available for payment source | Source: Ambulatory Visit | Attending: Family Medicine | Admitting: Family Medicine

## 2020-06-13 ENCOUNTER — Other Ambulatory Visit: Payer: Self-pay

## 2020-06-13 DIAGNOSIS — Z1231 Encounter for screening mammogram for malignant neoplasm of breast: Secondary | ICD-10-CM

## 2020-06-17 ENCOUNTER — Other Ambulatory Visit: Payer: Self-pay

## 2020-06-17 ENCOUNTER — Ambulatory Visit: Payer: No Typology Code available for payment source

## 2020-06-17 DIAGNOSIS — Z23 Encounter for immunization: Secondary | ICD-10-CM

## 2020-06-17 NOTE — Progress Notes (Signed)
° °  Covid-19 Vaccination Clinic  Name:  Sheri Moore    MRN: [id number] DOB: 1981-04-15  06/17/2020  [name] was observed post Covid-19 immunization for 15 minutes without incident. She was provided with Vaccine Information Sheet and instruction to access the V-Safe system.   [name] was instructed to call 911 with any severe reactions post vaccine:  Difficulty breathing   Swelling of face and throat   A fast heartbeat   A bad rash all over body   Dizziness and weakness   Immunizations Administered    Name Date Dose VIS Date Route   Moderna COVID-19 Vaccine 06/17/2020  1:07 PM 0.5 mL 09/2019 Intramuscular   Manufacturer: Moderna   Lot: [id number]   [place]: 88457-334-48

## 2020-07-15 ENCOUNTER — Ambulatory Visit (INDEPENDENT_AMBULATORY_CARE_PROVIDER_SITE_OTHER): Payer: No Typology Code available for payment source

## 2020-07-15 DIAGNOSIS — Z23 Encounter for immunization: Secondary | ICD-10-CM | POA: Diagnosis not present

## 2020-07-15 NOTE — Progress Notes (Signed)
   Covid-19 Vaccination Clinic  Name:  Sheri Moore    MRN: [id number] DOB: 20-Feb-1981  07/15/2020  Sheri Moore was observed post Covid-19 immunization for 15 minutes without incident. She was provided with Vaccine Information Sheet and instruction to access the V-Safe system.   Sheri Moore was instructed to call 911 with any severe reactions post vaccine: Marland Kitchen Difficulty breathing  . Swelling of face and throat  . A fast heartbeat  . A bad rash all over body  . Dizziness and weakness

## 2020-07-22 IMAGING — MR MR HEAD WO/W CM
14 series · 48 of 48 positions shown · IV contrast (multihance)
Comparison: MRI head 11/15/2007

CLINICAL DATA: Numbness and tingling

EXAM:
MRI HEAD WITHOUT AND WITH CONTRAST
TECHNIQUE: Multiplanar, multiecho pulse sequences of the brain and surrounding
structures were obtained without and with intravenous contrast.
CONTRAST:  15mL MULTIHANCE GADOBENATE DIMEGLUMINE 529 MG/ML IV SOLN

[Series 2: T1 · sagittal · 5.0mm · 0.45mm/px · 1 of 24 slices shown]
[im 1/24]
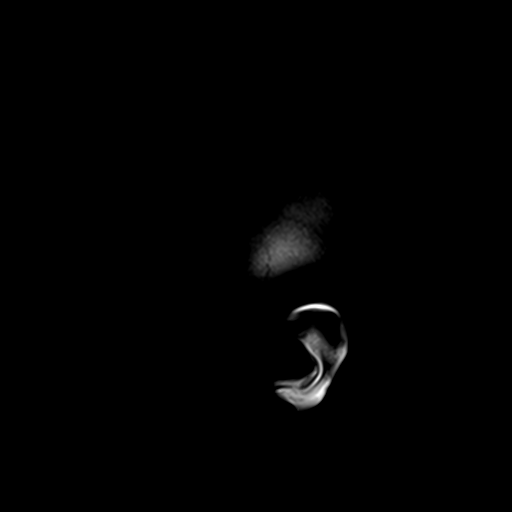

[Series 3: DWI · axial · 3.0mm · 1.80mm/px · z∈[-43,+104]mm · 6 of 100 slices shown (1 of 4)]
[im 1/100]
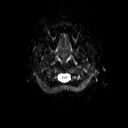
[im 20/100]
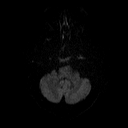
[im 40/100]
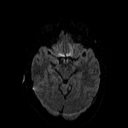
[im 60/100]
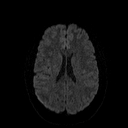
[im 80/100]
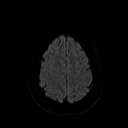
[im 100/100]
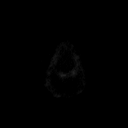

[Series 4: DWI · axial · 3.0mm · 1.80mm/px · z∈[-43,+104]mm · 3 of 50 slices shown (2 of 4)]
[im 1/50]
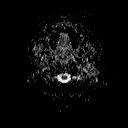
[im 25/50]
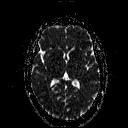
[im 50/50]
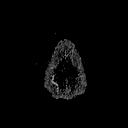

[Series 5: DWI · coronal · 5.0mm · 1.80mm/px · 5 of 76 slices shown (3 of 4)]
[im 1/76]
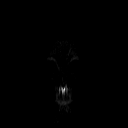
[im 19/76]
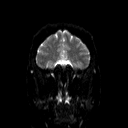
[im 38/76]
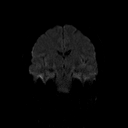
[im 57/76]
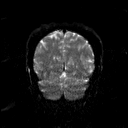
[im 76/76]
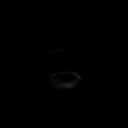

[Series 6: DWI · coronal · 5.0mm · 1.80mm/px · 2 of 38 slices shown (4 of 4)]
[im 1/38]
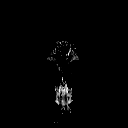
[im 38/38]
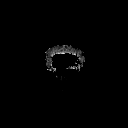

[Series 7: T2 · axial · 5.0mm · 0.51mm/px · 1 of 24 slices shown (1 of 2)]
[im 1/24]
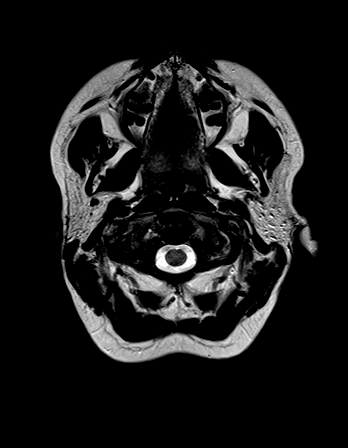

[Series 8: FLAIR · axial · 3.0mm · 0.45mm/px · z∈[-39,+114]mm · 2 of 34 slices shown (1 of 2)]
[im 1/34]
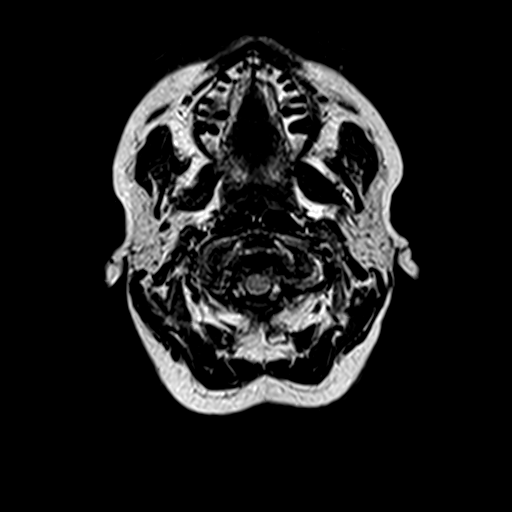
[im 34/34]
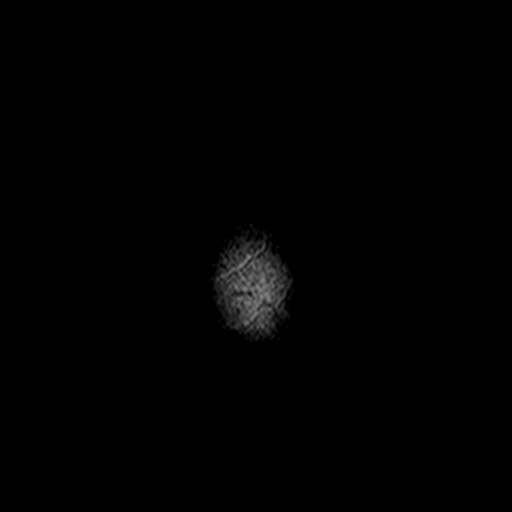

[Series 10: swi_images · axial · 5.0mm · 0.90mm/px · z∈[-27,+117]mm · 2 of 30 slices shown]
[im 1/30]
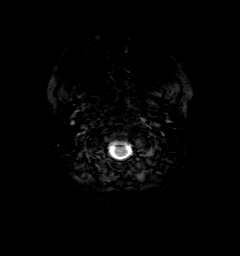
[im 30/30]
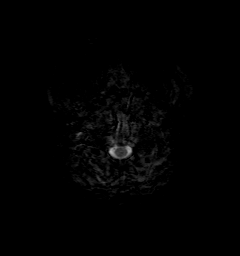

[Series 11: t1_mpr_tra · axial · 1.0mm · 0.75mm/px · z∈[-40,+108]mm · 9 of 144 slices shown (1 of 2)]
[im 1/144]
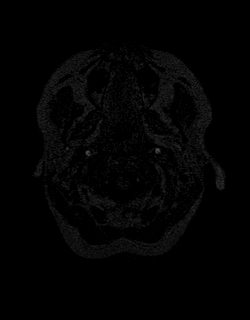
[im 18/144]
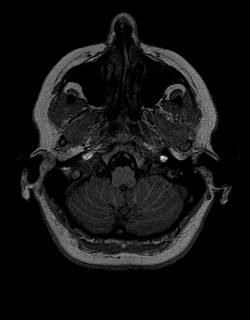
[im 36/144]
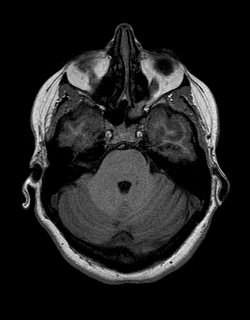
[im 54/144]
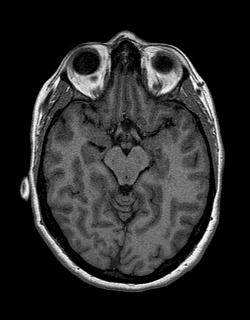
[im 72/144]
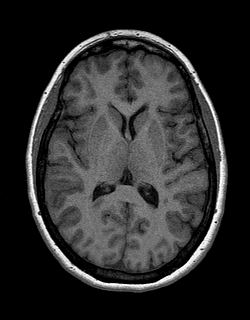
[im 90/144]
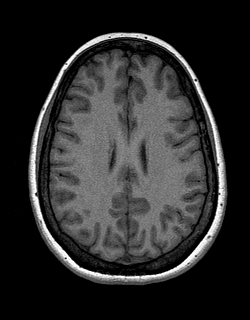
[im 108/144]
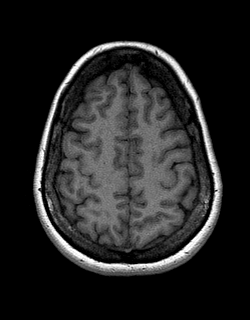
[im 126/144]
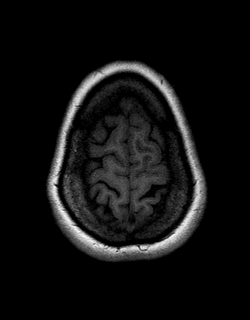
[im 144/144]
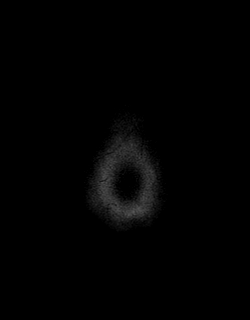

[Series 12: FLAIR · sagittal · 5.0mm · 0.45mm/px · 2 of 26 slices shown (2 of 2)]
[im 1/26]
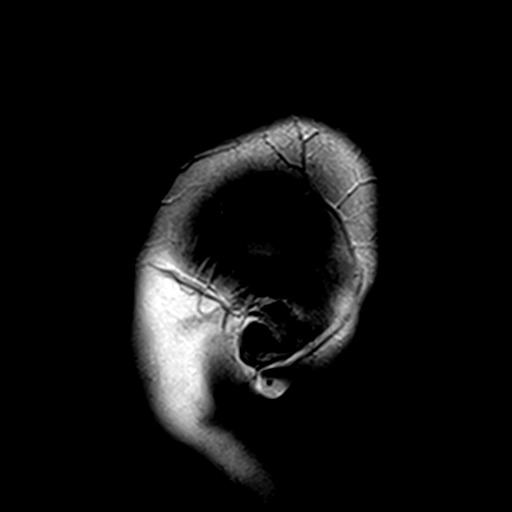
[im 26/26]
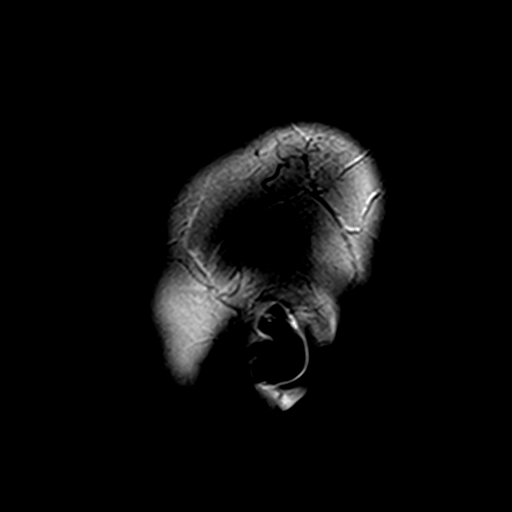

[Series 13: T2 · coronal · 5.0mm · 0.45mm/px · 2 of 30 slices shown (2 of 2)]
[im 1/30]
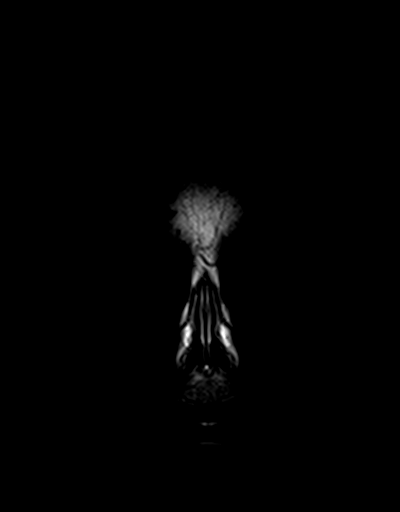
[im 30/30]
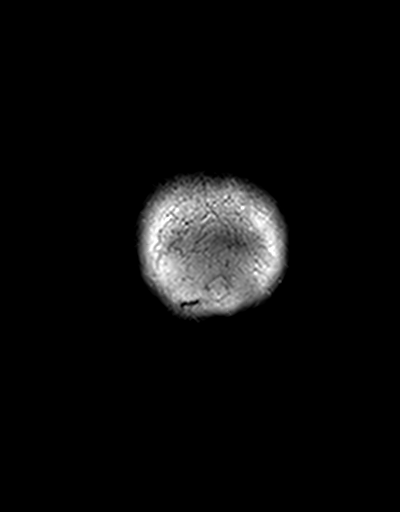

[Series 14: t1_mpr_tra · axial · 1.0mm · 0.75mm/px · z∈[-40,+108]mm · 9 of 144 slices shown (2 of 2)]
[im 1/144]
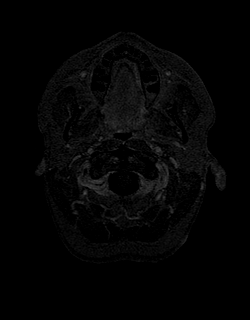
[im 18/144]
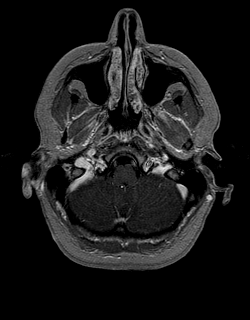
[im 36/144]
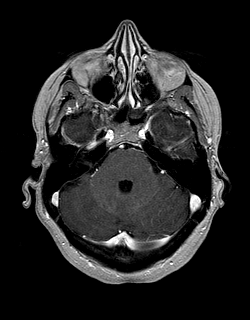
[im 54/144]
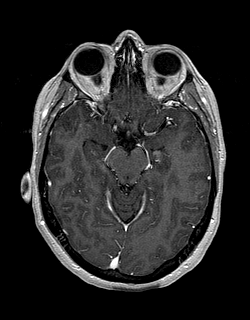
[im 72/144]
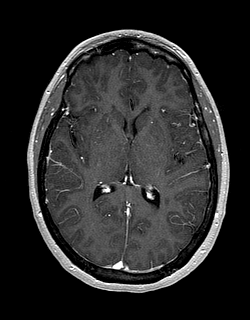
[im 90/144]
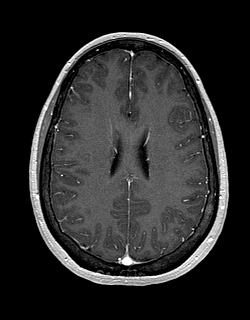
[im 108/144]
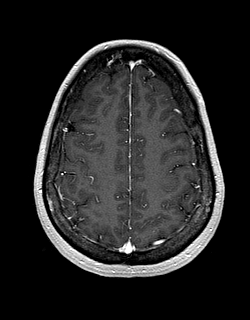
[im 126/144]
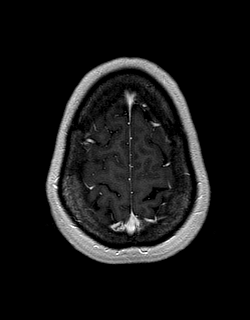
[im 144/144]
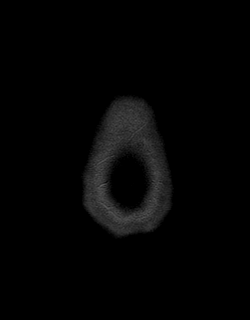

[Series 15: post cor · coronal · 5.0mm · 0.45mm/px · 2 of 30 slices shown]
[im 1/30]
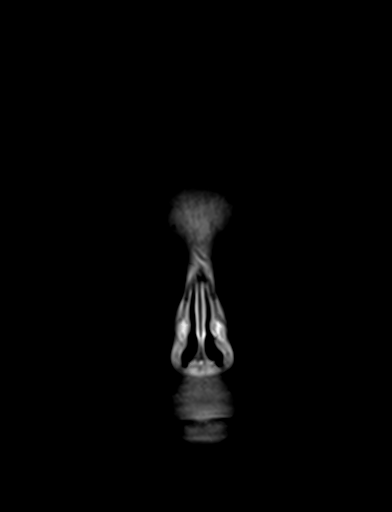
[im 30/30]
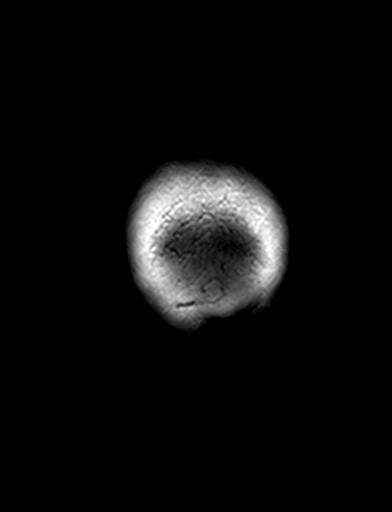

[Series 16: post sag (optional · sagittal · 5.0mm · 0.45mm/px · 2 of 25 slices shown]
[im 1/25]
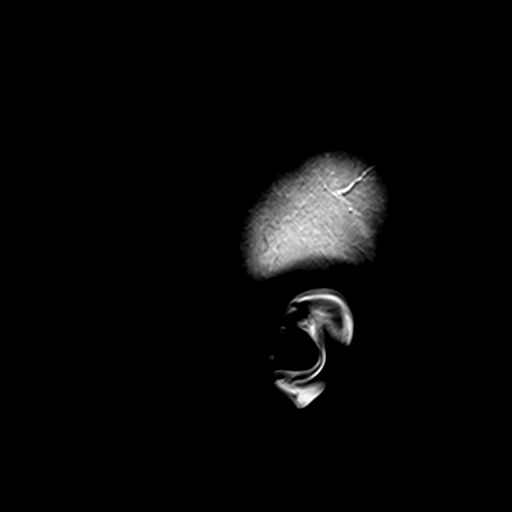
[im 25/25]
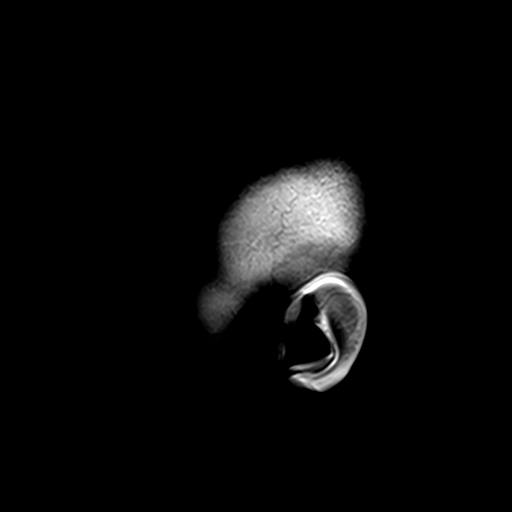

[48 of 48 positions shown; findings below may reference images not displayed]

FINDINGS: Brain: Progression of bilateral white matter lesions. Lesions are
small and mostly in the subcortical white matter with mild
periventricular white matter disease. No lesions show restricted
diffusion or abnormal enhancement. Lesions adjacent to the atrium of
the lateral ventricle bilaterally are suggestive of multiple
sclerosis based on location and morphology. Hyperintensity in the
right middle cerebellar peduncle suggestive of demyelinating
disease.

Ventricle size normal. No acute infarct, hemorrhage, or mass. Normal
enhancement postcontrast administration.

Vascular: Normal arterial flow voids

Skull and upper cervical spine: Negative

Sinuses/Orbits: Mild mucosal edema paranasal sinuses.  Normal orbit

Other: None
IMPRESSION: Progression of white matter disease since 3117. Probable multiple
sclerosis. No active lesions identified.

## 2020-07-25 ENCOUNTER — Other Ambulatory Visit: Payer: Self-pay

## 2020-07-25 ENCOUNTER — Encounter: Payer: Self-pay | Admitting: Nurse Practitioner

## 2020-07-25 ENCOUNTER — Ambulatory Visit (INDEPENDENT_AMBULATORY_CARE_PROVIDER_SITE_OTHER): Payer: No Typology Code available for payment source | Admitting: Nurse Practitioner

## 2020-07-25 VITALS — BP 122/70 | Temp 97.8°F | Ht 65.0 in | Wt 167.0 lb

## 2020-07-25 DIAGNOSIS — N3001 Acute cystitis with hematuria: Secondary | ICD-10-CM

## 2020-07-25 LAB — POCT URINALYSIS DIPSTICK
Bilirubin, UA: NEGATIVE
Glucose, UA: NEGATIVE
Ketones, UA: NEGATIVE
Leukocytes, UA: NEGATIVE
Nitrite, UA: POSITIVE
Protein, UA: POSITIVE — AB
Spec Grav, UA: >=1.03 — AB (ref 1.010–1.025)
Urobilinogen, UA: NEGATIVE E.U./dL — AB
pH, UA: 6 (ref 5.0–8.0)

## 2020-07-25 MED ORDER — NITROFURANTOIN MONOHYD MACRO 100 MG PO CAPS: 100.0000 mg | ORAL_CAPSULE | Freq: Two times a day (BID) | ORAL | 0 refills | Status: DC

## 2020-07-25 MED ORDER — NITROFURANTOIN MONOHYD MACRO 100 MG PO CAPS: 100.0000 mg | ORAL_CAPSULE | Freq: Two times a day (BID) | ORAL | 0 refills | Status: AC

## 2020-07-25 NOTE — Progress Notes (Signed)
Acute Office Visit  Subjective:    Patient ID: Sheri Moore, female    DOB: June 30, 1981, 39 y.o.   MRN: [id number]  Dysuria   HPI Patient is in today for dysuria, frequency, and urgency for with onset of 1-day. She denies fever, chills, CVA tenderness, or frank hematuria. She denies history of cystitis. She has pushed fluids. She denies taking  OTC treatment. She has a history of supracervical hysterectomy and gastroschisis.   Past Medical History:  Diagnosis Date  . Anxiety   . Depression   . Fibroid   . Gastroschisis     Past Surgical History:  Procedure Laterality Date  . ABDOMINAL HYSTERECTOMY     supracervical hysterectomy  . ABDOMINAL SURGERY    . OOPHORECTOMY      Family History  Problem Relation Age of Onset  . Uterine cancer Mother   . Diabetes Mother   . Hypertension Mother   . Thyroid disease Mother   . Skin cancer Father   . Subarachnoid hemorrhage Father   . Diabetes Maternal Grandmother   . Hypertension Maternal Grandmother   . Diabetes Paternal Grandfather   . Breast cancer Neg Hx     Social History   Socioeconomic History  . Marital status: Married    Spouse name: Not on file  . Number of children: 1  . Years of education: College  . Highest education level: Not on file  Occupational History  . Occupation: [employer]  Tobacco Use  . Smoking status: Former Smoker    Quit date: 10/11/2001    Years since quitting: 18.8  . Smokeless tobacco: Never Used  Substance and Sexual Activity  . Alcohol use: Yes    Comment: occ  . Drug use: No  . Sexual activity: Yes    Partners: Female    Birth control/protection: Surgical    Comment: supracervical hysterectomy  Other Topics Concern  . Not on file  Social History Narrative   Lives with husband and son   Caffeine use: rarely   Right handed                                        .   .   .   .   .   .   :   .   .   .   .     Outpatient Medications Prior to  Visit  Medication Sig Dispense Refill  . ADDERALL XR 20 MG 24 hr capsule Take 1 capsule (20 mg total) by mouth daily. 30 capsule 0  . LORazepam (ATIVAN) 0.5 MG tablet Take 1 tablet (0.5 mg total) by mouth every 8 (eight) hours. 30 tablet 1  . zolpidem (AMBIEN) 5 MG tablet Take 1 tablet (5 mg total) by mouth as needed. 30 tablet 5   No facility-administered medications prior to visit.    No Known Allergies  Review of Systems  Constitutional: Negative for chills and fever.  Gastrointestinal: Negative for abdominal distention, abdominal pain, nausea and vomiting.  Genitourinary: Positive for dysuria, frequency and urgency. Negative for hematuria and pelvic pain.  Musculoskeletal: Negative for arthralgias, back pain and myalgias.  Skin: Negative for rash.       Objective:    Physical Exam Constitutional:      Appearance: Normal appearance.  HENT:     Head: Normocephalic.  Abdominal:  Palpations: Abdomen is soft.     Tenderness: There is no abdominal tenderness. There is no right CVA tenderness or left CVA tenderness.  Skin:    General: Skin is warm and dry.     Capillary Refill: Capillary refill takes less than 2 seconds.  Neurological:     Mental Status: She is alert and oriented to person, place, and time.     There were no vitals taken for this visit. Wt Readings from Last 3 Encounters:  05/28/20 176 lb 6.4 oz (80 kg)  01/28/20 171 lb 12.8 oz (77.9 kg)  12/27/19 166 lb 3.2 oz (75.4 kg)    Health Maintenance Due  Topic Date Due  . Hepatitis C Screening  Never done  . HIV Screening  Never done  . TETANUS/TDAP  Never done  . INFLUENZA VACCINE  05/11/2020    There are no preventive care reminders to display for this patient.   Lab Results  Component Value Date   TSH 2.460 05/27/2020   Lab Results  Component Value Date   WBC 6.7 05/27/2020   HGB 14.5 05/27/2020   HCT 42.0 05/27/2020   MCV 87 05/27/2020   PLT 278 05/27/2020   Lab Results  Component  Value Date   NA 139 05/27/2020   K 4.6 05/27/2020   CO2 26 05/27/2020   GLUCOSE 104 (H) 05/27/2020   BUN 16 05/27/2020   CREATININE 0.71 05/27/2020   BILITOT 0.4 05/27/2020   ALKPHOS 88 05/27/2020   AST 31 05/27/2020   ALT 45 (H) 05/27/2020   PROT 6.5 05/27/2020   ALBUMIN 4.2 05/27/2020   CALCIUM 9.3 05/27/2020   Lab Results  Component Value Date   CHOL 211 (H) 05/27/2020   Lab Results  Component Value Date   HDL 51 05/27/2020   Lab Results  Component Value Date   LDLCALC 127 (H) 05/27/2020   Lab Results  Component Value Date   TRIG 185 (H) 05/27/2020   Lab Results  Component Value Date   CHOLHDL 4.1 05/27/2020        Assessment & Plan:    1. Acute cystitis with hematuria - POCT urinalysis dipstick - Urine Culture - nitrofurantoin, macrocrystal-monohydrate, (MACROBID) 100 MG capsule; Take 1 capsule (100 mg total) by mouth 2 (two) times daily for 5 days.  Dispense: 10 capsule; Refill: 0   Push fluids Do not hold urine Empty bladder immediately after intercourse Notify office if symptoms worsen or do not improve Follow-up as needed  Problem List Items Addressed This Visit           No orders of the defined types were placed in this encounter.    Rip Harbour, NP

## 2020-07-25 NOTE — Patient Instructions (Signed)
Push fluids Do not hold urine Empty bladder immediately after intercourse Notify office if symptoms worsen or do not improve Follow-up as needed Urinary Tract Infection, Adult A urinary tract infection (UTI) is an infection of any part of the urinary tract. The urinary tract includes:  The kidneys.  The ureters.  The bladder.  The urethra. These organs make, store, and get rid of pee (urine) in the body. What are the causes? This is caused by germs (bacteria) in your genital area. These germs grow and cause swelling (inflammation) of your urinary tract. What increases the risk? You are more likely to develop this condition if:  You have a small, thin tube (catheter) to drain pee.  You cannot control when you pee or poop (incontinence).  You are female, and: ? You use these methods to prevent pregnancy:  A medicine that kills sperm (spermicide).  A device that blocks sperm (diaphragm). ? You have low levels of a female hormone (estrogen). ? You are pregnant.  You have genes that add to your risk.  You are sexually active.  You take antibiotic medicines.  You have trouble peeing because of: ? A prostate that is bigger than normal, if you are female. ? A blockage in the part of your body that drains pee from the bladder (urethra). ? A kidney stone. ? A nerve condition that affects your bladder (neurogenic bladder). ? Not getting enough to drink. ? Not peeing often enough.  You have other conditions, such as: ? Diabetes. ? A weak disease-fighting system (immune system). ? Sickle cell disease. ? Gout. ? Injury of the spine. What are the signs or symptoms? Symptoms of this condition include:  Needing to pee right away (urgently).  Peeing often.  Peeing small amounts often.  Pain or burning when peeing.  Blood in the pee.  Pee that smells bad or not like normal.  Trouble peeing.  Pee that is cloudy.  Fluid coming from the vagina, if you are  female.  Pain in the belly or lower back. Other symptoms include:  Throwing up (vomiting).  No urge to eat.  Feeling mixed up (confused).  Being tired and grouchy (irritable).  A fever.  Watery poop (diarrhea). How is this treated? This condition may be treated with:  Antibiotic medicine.  Other medicines.  Drinking enough water. Follow these instructions at home:  Medicines  Take over-the-counter and prescription medicines only as told by your doctor.  If you were prescribed an antibiotic medicine, take it as told by your doctor. Do not stop taking it even if you start to feel better. General instructions  Make sure you: ? Pee until your bladder is empty. ? Do not hold pee for a long time. ? Empty your bladder after sex. ? Wipe from front to back after pooping if you are a female. Use each tissue one time when you wipe.  Drink enough fluid to keep your pee pale yellow.  Keep all follow-up visits as told by your doctor. This is important. Contact a doctor if:  You do not get better after 1-2 days.  Your symptoms go away and then come back. Get help right away if:  You have very bad back pain.  You have very bad pain in your lower belly.  You have a fever.  You are sick to your stomach (nauseous).  You are throwing up. Summary  A urinary tract infection (UTI) is an infection of any part of the urinary tract.  This condition  is caused by germs in your genital area.  There are many risk factors for a UTI. These include having a small, thin tube to drain pee and not being able to control when you pee or poop.  Treatment includes antibiotic medicines for germs.  Drink enough fluid to keep your pee pale yellow. This information is not intended to replace advice given to you by your health care provider. Make sure you discuss any questions you have with your health care provider. Document Revised: 09/14/2018 Document Reviewed: 04/06/2018 Elsevier  Patient Education  [place]. Nitrofurantoin tablets or capsules What is this medicine? NITROFURANTOIN (nye troe fyoor AN toyn) is an antibiotic. It is used to treat urinary tract infections. This medicine may be used for other purposes; ask your health care provider or pharmacist if you have questions. COMMON BRAND NAME(S): Macrobid, Macrodantin, Urotoin What should I tell my health care provider before I take this medicine? They need to know if you have any of these conditions:  anemia  diabetes  glucose-6-phosphate dehydrogenase deficiency  kidney disease  liver disease  lung disease  other chronic illness  an unusual or allergic reaction to nitrofurantoin, other antibiotics, other medicines, foods, dyes or preservatives  pregnant or trying to get pregnant  breast-feeding How should I use this medicine? Take this medicine by mouth with a glass of water. Follow the directions on the prescription label. Take this medicine with food or milk. Take your doses at regular intervals. Do not take your medicine more often than directed. Do not stop taking except on your doctor's advice. Talk to your pediatrician regarding the use of this medicine in children. While this drug may be prescribed for selected conditions, precautions do apply. Overdosage: If you think you have taken too much of this medicine contact a poison control center or emergency room at once. NOTE: This medicine is only for you. Do not share this medicine with others. What if I miss a dose? If you miss a dose, take it as soon as you can. If it is almost time for your next dose, take only that dose. Do not take double or extra doses. What may interact with this medicine?  antacids containing magnesium trisilicate  probenecid  quinolone antibiotics like ciprofloxacin, lomefloxacin, norfloxacin and ofloxacin  sulfinpyrazone This list may not describe all possible interactions. Give your health care  provider a list of all the medicines, herbs, non-prescription drugs, or dietary supplements you use. Also tell them if you smoke, drink alcohol, or use illegal drugs. Some items may interact with your medicine. What should I watch for while using this medicine? Tell your doctor or health care professional if your symptoms do not improve or if you get new symptoms. Drink several glasses of water a day. If you are taking this medicine for a long time, visit your doctor for regular checks on your progress. If you are diabetic, you may get a false positive result for sugar in your urine with certain brands of urine tests. Check with your doctor. What side effects may I notice from receiving this medicine? Side effects that you should report to your doctor or health care professional as soon as possible:  allergic reactions like skin rash or hives, swelling of the face, lips, or tongue  chest pain  cough  difficulty breathing  dizziness, drowsiness  fever or infection  joint aches or pains  pale or blue-tinted skin  redness, blistering, peeling or loosening of the skin, including inside the  mouth  tingling, burning, pain, or numbness in hands or feet  unusual bleeding or bruising  unusually weak or tired  yellowing of eyes or skin Side effects that usually do not require medical attention (report to your doctor or health care professional if they continue or are bothersome):  dark urine  diarrhea  headache  loss of appetite  nausea or vomiting  temporary hair loss This list may not describe all possible side effects. Call your doctor for medical advice about side effects. You may report side effects to FDA at 1-800-FDA-1088. Where should I keep my medicine? Keep out of the reach of children. Store at room temperature between 15 and 30 degrees C (59 and 86 degrees F). Protect from light. Throw away any unused medicine after the expiration date. NOTE: This sheet is a  summary. It may not cover all possible information. If you have questions about this medicine, talk to your doctor, pharmacist, or health care provider.  2020 Elsevier/Gold Standard (2008-04-17 15:56:47)

## 2020-07-30 LAB — URINE CULTURE

## 2020-07-30 NOTE — Progress Notes (Signed)
Bacteria susceptible to treatment. No change necessary to treatment

## 2020-07-31 ENCOUNTER — Ambulatory Visit (INDEPENDENT_AMBULATORY_CARE_PROVIDER_SITE_OTHER): Payer: No Typology Code available for payment source

## 2020-07-31 DIAGNOSIS — Z23 Encounter for immunization: Secondary | ICD-10-CM

## 2020-09-01 ENCOUNTER — Ambulatory Visit: Payer: No Typology Code available for payment source | Admitting: Family Medicine

## 2020-09-29 ENCOUNTER — Other Ambulatory Visit: Payer: Self-pay

## 2020-09-29 DIAGNOSIS — E782 Mixed hyperlipidemia: Secondary | ICD-10-CM

## 2020-09-30 ENCOUNTER — Other Ambulatory Visit: Payer: Self-pay

## 2020-09-30 ENCOUNTER — Ambulatory Visit (INDEPENDENT_AMBULATORY_CARE_PROVIDER_SITE_OTHER): Payer: No Typology Code available for payment source | Admitting: Family Medicine

## 2020-09-30 ENCOUNTER — Encounter: Payer: Self-pay | Admitting: Family Medicine

## 2020-09-30 ENCOUNTER — Other Ambulatory Visit: Payer: Self-pay | Admitting: Family Medicine

## 2020-09-30 VITALS — BP 130/90 | HR 76 | Temp 97.5°F | Resp 16 | Ht 65.0 in | Wt 171.0 lb

## 2020-09-30 DIAGNOSIS — R748 Abnormal levels of other serum enzymes: Secondary | ICD-10-CM | POA: Diagnosis not present

## 2020-09-30 DIAGNOSIS — E782 Mixed hyperlipidemia: Secondary | ICD-10-CM

## 2020-09-30 DIAGNOSIS — Z114 Encounter for screening for human immunodeficiency virus [HIV]: Secondary | ICD-10-CM | POA: Diagnosis not present

## 2020-09-30 LAB — COMPREHENSIVE METABOLIC PANEL
ALT: 39 IU/L — ABNORMAL HIGH (ref 0–32)
AST: 26 IU/L (ref 0–40)
Albumin/Globulin Ratio: 2.2 (ref 1.2–2.2)
Albumin: 4.6 g/dL (ref 3.8–4.8)
Alkaline Phosphatase: 124 IU/L — ABNORMAL HIGH (ref 44–121)
BUN/Creatinine Ratio: 13 (ref 9–23)
BUN: 9 mg/dL (ref 6–24)
Bilirubin Total: 0.3 mg/dL (ref 0.0–1.2)
CO2: 25 mmol/L (ref 20–29)
Calcium: 9.5 mg/dL (ref 8.7–10.2)
Chloride: 101 mmol/L (ref 96–106)
Creatinine, Ser: 0.69 mg/dL (ref 0.57–1.00)
GFR calc Af Amer: 125 mL/min/{1.73_m2} (ref >59)
GFR calc non Af Amer: 108 mL/min/{1.73_m2} (ref >59)
Globulin, Total: 2.1 g/dL (ref 1.5–4.5)
Glucose: 115 mg/dL — ABNORMAL HIGH (ref 65–99)
Potassium: 4.4 mmol/L (ref 3.5–5.2)
Sodium: 141 mmol/L (ref 134–144)
Total Protein: 6.7 g/dL (ref 6.0–8.5)

## 2020-09-30 LAB — LIPID PANEL
Chol/HDL Ratio: 3.7 ratio (ref 0.0–4.4)
Cholesterol, Total: 176 mg/dL (ref 100–199)
HDL: 47 mg/dL (ref >39)
LDL Chol Calc (NIH): 97 mg/dL (ref 0–99)
Triglycerides: 185 mg/dL — ABNORMAL HIGH (ref 0–149)
VLDL Cholesterol Cal: 32 mg/dL (ref 5–40)

## 2020-09-30 LAB — CARDIOVASCULAR RISK ASSESSMENT

## 2020-09-30 MED ORDER — ZOLPIDEM TARTRATE 5 MG PO TABS
5.0000 mg | ORAL_TABLET | ORAL | 5 refills | Status: DC | PRN
Start: 2020-09-30 — End: 2020-10-01

## 2020-09-30 MED ORDER — ADDERALL XR 20 MG PO CP24
20.0000 mg | ORAL_CAPSULE | Freq: Every day | ORAL | 0 refills | Status: DC
Start: 2020-09-30 — End: 2020-12-01

## 2020-09-30 NOTE — Progress Notes (Signed)
Subjective:  Patient ID: Sheri Moore, female    DOB: [birth year]  Age: 39 y.o. MRN: [id number]  Chief Complaint  Patient presents with  . Hyperlipidemia    HPI: Hyperlipidemia: Eating healthy/exercies.  Insomnia: ambien and melatonin alternating.  GAD: Off trintillex. Doing well. Taking lorazepam 0.5 mg one to two times per day. Patient does not take any medication for cholesterol. Patient eats healthy and exercise.   Current Outpatient Medications on File Prior to Visit  Medication Sig Dispense Refill  . LORazepam (ATIVAN) 0.5 MG tablet Take 1 tablet (0.5 mg total) by mouth every 8 (eight) hours. 30 tablet 1   No current facility-administered medications on file prior to visit.   Past Medical History:  Diagnosis Date  . Anxiety   . Depression   . Fibroid   . Gastroschisis    Past Surgical History:  Procedure Laterality Date  . ABDOMINAL HYSTERECTOMY     supracervical hysterectomy  . ABDOMINAL SURGERY    . OOPHORECTOMY      Family History  Problem Relation Age of Onset  . Uterine cancer Mother   . Diabetes Mother   . Hypertension Mother   . Thyroid disease Mother   . Skin cancer Father   . Subarachnoid hemorrhage Father   . Diabetes Maternal Grandmother   . Hypertension Maternal Grandmother   . Diabetes Paternal Grandfather   . Breast cancer Neg Hx    Social History   Socioeconomic History  . Marital status: Married    Spouse name: Not on file  . Number of children: 1  . Years of education: College  . Highest education level: Not on file  Occupational History  . Occupation: [employer]  Tobacco Use  . Smoking status: Former Smoker    Quit date: 10/11/2001    Years since quitting: 19.0  . Smokeless tobacco: Never Used  Substance and Sexual Activity  . Alcohol use: Yes    Comment: occ  . Drug use: No  . Sexual activity: Yes    Partners: Female    Birth control/protection: Surgical    Comment: supracervical hysterectomy  Other Topics  Concern  . Not on file  Social History Narrative   Lives with husband and son   Caffeine use: rarely   Right handed    Social Determinants of Health   Financial Resource Strain: Not on file  Food Insecurity: Not on file  Transportation Needs: Not on file  Physical Activity: Not on file  Stress: Not on file  Social Connections: Not on file    Review of Systems  Constitutional: Negative for chills, fatigue and fever.  HENT: Positive for congestion. Negative for ear pain and sore throat.   Respiratory: Negative for cough and shortness of breath.   Cardiovascular: Negative for chest pain.  Gastrointestinal: Negative for abdominal pain, constipation, diarrhea, nausea and vomiting.     Objective:  BP 130/90   Pulse 76   Temp (!) 97.5 F (36.4 C)   Resp 16   Ht 5\' 5"  (1.651 m)   Wt 171 lb (77.6 kg)   SpO2 98%   BMI 28.46 kg/m   BP/Weight 09/30/2020 07/25/2020 [id number]  Systolic BP [id number]  Diastolic BP 90 70 90  Wt. (Lbs) 171 167 176.4  BMI 28.46 27.79 29.35    Physical Exam Vitals reviewed.  Constitutional:      Appearance: Normal appearance. She is normal weight.  Cardiovascular:     Rate and Rhythm: Normal rate  and regular rhythm.     Pulses: Normal pulses.     Heart sounds: Normal heart sounds.  Pulmonary:     Effort: Pulmonary effort is normal. No respiratory distress.     Breath sounds: Normal breath sounds.  Neurological:     Mental Status: She is alert and oriented to person, place, and time.  Psychiatric:        Mood and Affect: Mood normal.        Behavior: Behavior normal.       Lab Results  Component Value Date   WBC 6.7 05/27/2020   HGB 14.5 05/27/2020   HCT 42.0 05/27/2020   PLT 278 05/27/2020   GLUCOSE 99 09/30/2020   CHOL 176 09/29/2020   TRIG 185 (H) 09/29/2020   HDL 47 09/29/2020   LDLCALC 97 09/29/2020   ALT 54 (H) 09/30/2020   AST 39 09/30/2020   NA 141 09/30/2020   K 4.3 09/30/2020   CL 100 09/30/2020   CREATININE  0.81 09/30/2020   BUN 10 09/30/2020   CO2 22 09/30/2020   TSH 2.460 05/27/2020      Assessment & Plan:   1. Mixed hyperlipidemia Continue to work on eating low fat diet and exercise Recheck FLP in 3 months.  2. Elevated liver enzymes - HCV Ab w Reflex to Quant PCR - Comprehensive metabolic panel  3. Screening for HIV (human immunodeficiency virus) - HIV antibody (with reflex)    Orders Placed This Encounter  Procedures  . HCV Ab w Reflex to Quant PCR  . Comprehensive metabolic panel  . HIV antibody (with reflex)  . Interpretation:      Follow-up: Return in about 3 months (around 12/29/2020) for fasting.  An After Visit Summary was printed and given to the patient.  Rochel Brome, MD [facility] [phone]

## 2020-10-01 ENCOUNTER — Other Ambulatory Visit: Payer: Self-pay | Admitting: Family Medicine

## 2020-10-01 LAB — HCV INTERPRETATION

## 2020-10-01 LAB — COMPREHENSIVE METABOLIC PANEL
ALT: 54 IU/L — ABNORMAL HIGH (ref 0–32)
AST: 39 IU/L (ref 0–40)
Albumin/Globulin Ratio: 2.1 (ref 1.2–2.2)
Albumin: 5.1 g/dL — ABNORMAL HIGH (ref 3.8–4.8)
Alkaline Phosphatase: 100 IU/L (ref 44–121)
BUN/Creatinine Ratio: 12 (ref 9–23)
BUN: 10 mg/dL (ref 6–24)
Bilirubin Total: 0.4 mg/dL (ref 0.0–1.2)
CO2: 22 mmol/L (ref 20–29)
Calcium: 10.3 mg/dL — ABNORMAL HIGH (ref 8.7–10.2)
Chloride: 100 mmol/L (ref 96–106)
Creatinine, Ser: 0.81 mg/dL (ref 0.57–1.00)
GFR calc Af Amer: 104 mL/min/{1.73_m2} (ref >59)
GFR calc non Af Amer: 90 mL/min/{1.73_m2} (ref >59)
Globulin, Total: 2.4 g/dL (ref 1.5–4.5)
Glucose: 99 mg/dL (ref 65–99)
Potassium: 4.3 mmol/L (ref 3.5–5.2)
Sodium: 141 mmol/L (ref 134–144)
Total Protein: 7.5 g/dL (ref 6.0–8.5)

## 2020-10-01 LAB — HCV AB W REFLEX TO QUANT PCR: HCV Ab: <0.1 s/co ratio (ref 0.0–0.9)

## 2020-10-01 LAB — HIV ANTIBODY (ROUTINE TESTING W REFLEX): HIV Screen 4th Generation wRfx: NONREACTIVE

## 2020-10-01 MED ORDER — ZOLPIDEM TARTRATE 5 MG PO TABS: 5.0000 mg | ORAL_TABLET | Freq: Every evening | ORAL | 5 refills | Status: DC | PRN

## 2020-10-01 MED FILL — ZOLPIDEM TARTRATE 5 MG TABS: 5 | 30 days supply | Qty: 30 | Fill #0

## 2020-10-01 MED FILL — ADDERALL XR 20 MG CAP SA: 20 | 30 days supply | Qty: 30 | Fill #0

## 2020-10-12 ENCOUNTER — Encounter: Payer: Self-pay | Admitting: Family Medicine

## 2020-10-13 ENCOUNTER — Ambulatory Visit: Payer: No Typology Code available for payment source

## 2020-10-13 DIAGNOSIS — Z20822 Contact with and (suspected) exposure to covid-19: Secondary | ICD-10-CM

## 2020-10-13 LAB — POC COVID19 BINAXNOW: SARS Coronavirus 2 Ag: NEGATIVE

## 2020-10-15 LAB — SARS-COV-2, NAA 2 DAY TAT

## 2020-10-15 LAB — NOVEL CORONAVIRUS, NAA: SARS-CoV-2, NAA: NOT DETECTED

## 2020-10-17 ENCOUNTER — Ambulatory Visit (INDEPENDENT_AMBULATORY_CARE_PROVIDER_SITE_OTHER): Payer: No Typology Code available for payment source | Admitting: Family Medicine

## 2020-10-17 DIAGNOSIS — Z20822 Contact with and (suspected) exposure to covid-19: Secondary | ICD-10-CM | POA: Diagnosis not present

## 2020-10-17 LAB — POC COVID19 BINAXNOW: SARS Coronavirus 2 Ag: NEGATIVE

## 2020-10-17 NOTE — Progress Notes (Signed)
Covid test-[facility]-negative No charge-no discussion with pt on the telephone Staff tested pt in parking lot per [facility] recommendations

## 2020-10-28 ENCOUNTER — Other Ambulatory Visit: Payer: Self-pay | Admitting: Nurse Practitioner

## 2020-10-28 DIAGNOSIS — R059 Cough, unspecified: Secondary | ICD-10-CM

## 2020-10-28 DIAGNOSIS — J069 Acute upper respiratory infection, unspecified: Secondary | ICD-10-CM

## 2020-10-28 MED ORDER — BENZONATATE 200 MG PO CAPS: 200.0000 mg | ORAL_CAPSULE | Freq: Three times a day (TID) | ORAL | 0 refills | Status: DC | PRN

## 2020-10-28 MED ORDER — AZITHROMYCIN 250 MG PO TABS: ORAL_TABLET | ORAL | 0 refills | Status: DC

## 2020-10-30 MED FILL — ZOLPIDEM TARTRATE 5 MG TABS: 5 | 30 days supply | Qty: 30 | Fill #1

## 2020-11-05 ENCOUNTER — Ambulatory Visit (INDEPENDENT_AMBULATORY_CARE_PROVIDER_SITE_OTHER): Payer: No Typology Code available for payment source | Admitting: Family Medicine

## 2020-11-05 ENCOUNTER — Other Ambulatory Visit: Payer: Self-pay | Admitting: Family Medicine

## 2020-11-05 ENCOUNTER — Ambulatory Visit
Admission: RE | Admit: 2020-11-05 | Discharge: 2020-11-05 | Disposition: A | Discharge status: Home / Self Care | Payer: No Typology Code available for payment source | Source: Ambulatory Visit | Attending: Family Medicine | Admitting: Family Medicine

## 2020-11-05 VITALS — BP 130/78 | HR 90 | Temp 97.3°F | Resp 18

## 2020-11-05 DIAGNOSIS — R051 Acute cough: Secondary | ICD-10-CM

## 2020-11-05 DIAGNOSIS — J4540 Moderate persistent asthma, uncomplicated: Secondary | ICD-10-CM | POA: Diagnosis not present

## 2020-11-09 ENCOUNTER — Encounter: Payer: Self-pay | Admitting: Family Medicine

## 2020-11-09 NOTE — Progress Notes (Signed)
Acute Office Visit  Subjective:    Patient ID: Sheri Moore, female    DOB: 1981/07/07, 40 y.o.   MRN: [id number]  Chief Complaint  Patient presents with  . Cough    HPI Patient is in today complaining of cough since New Year's.  It is a nonproductive cough.  She also has some shortness of breath particularly with exertion.  She complains of some chest pain which is stabbing and brief.  It happens a few times per day.  Denies fever, chills, sweats, postnasal drainage.  Past Medical History:  Diagnosis Date  . Anxiety   . Depression   . Fibroid   . Gastroschisis     Past Surgical History:  Procedure Laterality Date  . ABDOMINAL HYSTERECTOMY     supracervical hysterectomy  . ABDOMINAL SURGERY    . OOPHORECTOMY      Family History  Problem Relation Age of Onset  . Uterine cancer Mother   . Diabetes Mother   . Hypertension Mother   . Thyroid disease Mother   . Skin cancer Father   . Subarachnoid hemorrhage Father   . Diabetes Maternal Grandmother   . Hypertension Maternal Grandmother   . Diabetes Paternal Grandfather   . Breast cancer Neg Hx     Social History   Socioeconomic History  . Marital status: Married    Spouse name: Not on file  . Number of children: 1  . Years of education: College  . Highest education level: Not on file  Occupational History  . Occupation: [employer]  Tobacco Use  . Smoking status: Former Smoker    Quit date: 10/11/2001    Years since quitting: 19.0  . Smokeless tobacco: Never Used  Substance and Sexual Activity  . Alcohol use: Yes    Comment: occ  . Drug use: No  . Sexual activity: Yes    Partners: Female    Birth control/protection: Surgical    Comment: supracervical hysterectomy  Other Topics Concern  . Not on file  Social History Narrative   Lives with husband and son   Caffeine use: rarely   Right handed    Social Determinants of Health   Financial Resource Strain: Not on file  Food Insecurity: Not  on file  Transportation Needs: Not on file  Physical Activity: Not on file  Stress: Not on file  Social Connections: Not on file  Intimate Partner Violence: Not on file    Outpatient Medications Prior to Visit  Medication Sig Dispense Refill  . ADDERALL XR 20 MG 24 hr capsule Take 1 capsule (20 mg total) by mouth daily. 30 capsule 0  . azithromycin (ZITHROMAX) 250 MG tablet Take two tablets by mouth on day one, take one tablet by mouth on days two-five 6 tablet 0  . benzonatate (TESSALON) 200 MG capsule Take 1 capsule (200 mg total) by mouth 3 (three) times daily as needed for cough. 30 capsule 0  . LORazepam (ATIVAN) 0.5 MG tablet Take 1 tablet (0.5 mg total) by mouth every 8 (eight) hours. 30 tablet 1  . zolpidem (AMBIEN) 5 MG tablet Take 1 tablet (5 mg total) by mouth at bedtime as needed. 30 tablet 5   No facility-administered medications prior to visit.    No Known Allergies  Review of Systems  Constitutional: Negative for chills, fatigue and fever.  HENT: Negative for congestion, ear pain, postnasal drip and sore throat.   Respiratory: Positive for cough and shortness of breath.  Cardiovascular: Positive for chest pain.       Objective:    Physical Exam Vitals reviewed.  Constitutional:      Appearance: Normal appearance. She is normal weight.  HENT:     Right Ear: Tympanic membrane, ear canal and external ear normal.     Left Ear: Tympanic membrane, ear canal and external ear normal.     Nose: Nose normal.     Mouth/Throat:     Pharynx: Oropharynx is clear.  Neck:     Vascular: No carotid bruit.  Cardiovascular:     Rate and Rhythm: Normal rate and regular rhythm.     Pulses: Normal pulses.     Heart sounds: Normal heart sounds. No murmur heard.   Pulmonary:     Effort: Pulmonary effort is normal. No respiratory distress.     Breath sounds: Normal breath sounds.  Neurological:     Mental Status: She is alert and oriented to person, place, and time.   Psychiatric:        Mood and Affect: Mood normal.        Behavior: Behavior normal.     BP 130/78   Pulse 90   Temp (!) 97.3 F (36.3 C)   Resp 18   SpO2 99%  Wt Readings from Last 3 Encounters:  09/30/20 171 lb (77.6 kg)  07/25/20 167 lb (75.8 kg)  05/28/20 176 lb 6.4 oz (80 kg)    Health Maintenance Due  Topic Date Due  . PAP SMEAR-Modifier  11/18/2020    There are no preventive care reminders to display for this patient.   Lab Results  Component Value Date   TSH 2.460 05/27/2020   Lab Results  Component Value Date   WBC 6.7 05/27/2020   HGB 14.5 05/27/2020   HCT 42.0 05/27/2020   MCV 87 05/27/2020   PLT 278 05/27/2020   Lab Results  Component Value Date   NA 141 09/30/2020   K 4.3 09/30/2020   CO2 22 09/30/2020   GLUCOSE 99 09/30/2020   BUN 10 09/30/2020   CREATININE 0.81 09/30/2020   BILITOT 0.4 09/30/2020   ALKPHOS 100 09/30/2020   AST 39 09/30/2020   ALT 54 (H) 09/30/2020   PROT 7.5 09/30/2020   ALBUMIN 5.1 (H) 09/30/2020   CALCIUM 10.3 (H) 09/30/2020   Lab Results  Component Value Date   CHOL 176 09/29/2020   Lab Results  Component Value Date   HDL 47 09/29/2020   Lab Results  Component Value Date   LDLCALC 97 09/29/2020   Lab Results  Component Value Date   TRIG 185 (H) 09/29/2020   Lab Results  Component Value Date   CHOLHDL 3.7 09/29/2020   No results found for: HGBA1C     Assessment & Plan:  1. Moderate persistent asthma without complication - Peak flow meter Peak flows: 200, 300, 350 Albuterol nebulizer given.  Peak flow post nebulizer: 325, 400, 400. Sample of Breztri 2 puffs twice a day.  Pt to let me know if this helps her breathing and I will send rx.    Follow-up: Return if symptoms worsen or fail to improve.  An After Visit Summary was printed and given to the patient.  Rochel Brome, MD [facility] [phone]

## 2020-11-19 ENCOUNTER — Encounter: Payer: Self-pay | Admitting: Family Medicine

## 2020-12-01 ENCOUNTER — Other Ambulatory Visit: Payer: Self-pay

## 2020-12-02 ENCOUNTER — Other Ambulatory Visit: Payer: Self-pay | Admitting: Family Medicine

## 2020-12-02 MED ORDER — ADDERALL XR 20 MG PO CP24: 20.0000 mg | ORAL_CAPSULE | Freq: Every day | ORAL | 0 refills | Status: DC

## 2020-12-02 MED FILL — ADDERALL XR 20 MG CAP SA: 20 | 30 days supply | Qty: 30 | Fill #0

## 2020-12-03 ENCOUNTER — Other Ambulatory Visit: Payer: Self-pay | Admitting: Family Medicine

## 2020-12-03 MED ORDER — ADDERALL XR 20 MG PO CP24: 20.0000 mg | ORAL_CAPSULE | Freq: Every day | ORAL | 0 refills | Status: DC

## 2020-12-03 MED FILL — ZOLPIDEM TARTRATE 5 MG TABS: 5 | 30 days supply | Qty: 30 | Fill #2

## 2020-12-05 ENCOUNTER — Ambulatory Visit: Payer: No Typology Code available for payment source | Admitting: Certified Nurse Midwife

## 2020-12-25 ENCOUNTER — Other Ambulatory Visit: Payer: Self-pay | Admitting: Family Medicine

## 2020-12-25 MED ORDER — AMPHETAMINE-DEXTROAMPHET ER 25 MG PO CP24: 25.0000 mg | ORAL_CAPSULE | ORAL | 0 refills | Status: DC

## 2020-12-25 NOTE — Progress Notes (Signed)
Discussed adderal xr with [name]. Pt is snapping more. Becoming frustrated. Does not feel depressed or anxious. Recommended increase adderal xr to 30 mg once daily in am. Keep appt in 2 weeks .kc

## 2020-12-29 ENCOUNTER — Telehealth: Payer: Self-pay

## 2020-12-29 ENCOUNTER — Other Ambulatory Visit: Payer: Self-pay

## 2020-12-29 DIAGNOSIS — E782 Mixed hyperlipidemia: Secondary | ICD-10-CM

## 2020-12-29 NOTE — Telephone Encounter (Signed)
error 

## 2020-12-29 NOTE — Progress Notes (Signed)
Subjective:  Patient ID: Sheri Moore, female    DOB: Nov 18, 1980  Age: 40 y.o. MRN: [id number]  Chief Complaint  Patient presents with  . Hyperlipidemia    HPI Insomnia: Takes about 2 hours to go to sleep. On ambien 5 mg once daily at night. Averages 7 hours. Feels rested.   Hyperlipidemia: Pt has worked very hard on eating healthy and exercising. LDL improved to 127.   ADD:  Pt is on adderall xr 25 mg once in am. Has lorazepam for anxiety, but rarely ever takes.  Current Outpatient Medications on File Prior to Visit  Medication Sig Dispense Refill  . amphetamine-dextroamphetamine (ADDERALL XR) 25 MG 24 hr capsule TAKE 1 CAPSULE BY MOUTH EVERY MORNING. 30 capsule 0  . LORazepam (ATIVAN) 0.5 MG tablet Take 1 tablet (0.5 mg total) by mouth every 8 (eight) hours. 30 tablet 1   No current facility-administered medications on file prior to visit.   Past Medical History:  Diagnosis Date  . Anxiety   . Depression   . Fibroid   . Gastroschisis    Past Surgical History:  Procedure Laterality Date  . ABDOMINAL HYSTERECTOMY     supracervical hysterectomy  . ABDOMINAL SURGERY    . OOPHORECTOMY      Family History  Problem Relation Age of Onset  . Uterine cancer Mother   . Diabetes Mother   . Hypertension Mother   . Thyroid disease Mother   . Skin cancer Father   . Subarachnoid hemorrhage Father   . Diabetes Maternal Grandmother   . Hypertension Maternal Grandmother   . Diabetes Paternal Grandfather   . Breast cancer Neg Hx    Social History   Socioeconomic History  . Marital status: Married    Spouse name: Not on file  . Number of children: 1  . Years of education: College  . Highest education level: Not on file  Occupational History  . Occupation: [employer]  Tobacco Use  . Smoking status: Former Smoker    Quit date: 10/11/2001    Years since quitting: 19.2  . Smokeless tobacco: Never Used  Substance and Sexual Activity  . Alcohol use: Yes     Comment: occ  . Drug use: No  . Sexual activity: Yes    Partners: Female    Birth control/protection: Surgical    Comment: supracervical hysterectomy  Other Topics Concern  . Not on file  Social History Narrative   Lives with husband and son   Caffeine use: rarely   Right handed    Social Determinants of Health   Financial Resource Strain: Not on file  Food Insecurity: Not on file  Transportation Needs: Not on file  Physical Activity: Not on file  Stress: Not on file  Social Connections: Not on file    Review of Systems  Constitutional: Negative for chills, fatigue and fever.  HENT: Negative for congestion, ear pain, rhinorrhea and sore throat.   Respiratory: Negative for cough and shortness of breath.   Cardiovascular: Negative for chest pain.  Gastrointestinal: Negative for abdominal pain, constipation, diarrhea, nausea and vomiting.  Genitourinary: Negative for dysuria and urgency.  Musculoskeletal: Negative for back pain and myalgias.  Neurological: Negative for dizziness, weakness, light-headedness and headaches.  Psychiatric/Behavioral: Negative for dysphoric mood. The patient is not nervous/anxious.      Objective:  BP 116/80   Pulse 82   Temp (!) 96.3 F (35.7 C)   Ht 5\' 5"  (1.651 m)   Wt 162  lb 12.8 oz (73.8 kg)   SpO2 99%   BMI 27.09 kg/m   BP/Weight 12/30/2020 11/05/2020 68/08/5725  Systolic BP 203 559 741  Diastolic BP 80 78 90  Wt. (Lbs) 162.8 - 171  BMI 27.09 - 28.46    Physical Exam Vitals reviewed.  Constitutional:      Appearance: Normal appearance. She is normal weight.  Cardiovascular:     Rate and Rhythm: Normal rate and regular rhythm.     Heart sounds: Normal heart sounds.  Pulmonary:     Effort: Pulmonary effort is normal. No respiratory distress.     Breath sounds: Normal breath sounds.  Neurological:     Mental Status: She is alert and oriented to person, place, and time.  Psychiatric:        Mood and Affect: Mood normal.         Behavior: Behavior normal.     Lab Results  Component Value Date   WBC 6.0 12/29/2020   HGB 14.6 12/29/2020   HCT 42.8 12/29/2020   PLT 321 12/29/2020   GLUCOSE 97 12/29/2020   CHOL 186 12/29/2020   TRIG 92 12/29/2020   HDL 42 12/29/2020   LDLCALC 127 (H) 12/29/2020   ALT 33 (H) 12/29/2020   AST 28 12/29/2020   NA 141 12/29/2020   K 4.5 12/29/2020   CL 104 12/29/2020   CREATININE 0.69 12/29/2020   BUN 11 12/29/2020   CO2 23 12/29/2020   TSH 2.460 05/27/2020      Assessment & Plan:   1. Mixed hyperlipidemia  Continue low fat diet and exercise.   2. GAD (generalized anxiety disorder)    Pt does not want to restart trintellix. Continue lorazepam prn. Continue to use sparingly.   3. Attention deficit disorder (ADD) without hyperactivity  Continue adderal xr 25 mg once daily in am. Consider increase. Pt may call if wishes to increase dose.  4. Primary insomnia: increase ambien to 10 mg once at night.   Follow-up: Return in about 3 months (around 04/01/2021).  An After Visit Summary was printed and given to the patient.  Rochel Brome, MD [facility] [phone]

## 2020-12-30 ENCOUNTER — Other Ambulatory Visit: Payer: Self-pay | Admitting: Family Medicine

## 2020-12-30 ENCOUNTER — Ambulatory Visit (INDEPENDENT_AMBULATORY_CARE_PROVIDER_SITE_OTHER): Payer: No Typology Code available for payment source | Admitting: Family Medicine

## 2020-12-30 ENCOUNTER — Other Ambulatory Visit: Payer: Self-pay

## 2020-12-30 VITALS — BP 116/80 | HR 82 | Temp 96.3°F | Ht 65.0 in | Wt 162.8 lb

## 2020-12-30 DIAGNOSIS — F5101 Primary insomnia: Secondary | ICD-10-CM | POA: Diagnosis not present

## 2020-12-30 DIAGNOSIS — J4540 Moderate persistent asthma, uncomplicated: Secondary | ICD-10-CM

## 2020-12-30 DIAGNOSIS — F988 Other specified behavioral and emotional disorders with onset usually occurring in childhood and adolescence: Secondary | ICD-10-CM

## 2020-12-30 DIAGNOSIS — E782 Mixed hyperlipidemia: Secondary | ICD-10-CM | POA: Diagnosis not present

## 2020-12-30 DIAGNOSIS — F411 Generalized anxiety disorder: Secondary | ICD-10-CM | POA: Diagnosis not present

## 2020-12-30 LAB — COMPREHENSIVE METABOLIC PANEL
ALT: 33 IU/L — ABNORMAL HIGH (ref 0–32)
AST: 28 IU/L (ref 0–40)
Albumin/Globulin Ratio: 2.3 — ABNORMAL HIGH (ref 1.2–2.2)
Albumin: 4.5 g/dL (ref 3.8–4.8)
Alkaline Phosphatase: 85 IU/L (ref 44–121)
BUN/Creatinine Ratio: 16 (ref 9–23)
BUN: 11 mg/dL (ref 6–24)
Bilirubin Total: 0.6 mg/dL (ref 0.0–1.2)
CO2: 23 mmol/L (ref 20–29)
Calcium: 9 mg/dL (ref 8.7–10.2)
Chloride: 104 mmol/L (ref 96–106)
Creatinine, Ser: 0.69 mg/dL (ref 0.57–1.00)
Globulin, Total: 2 g/dL (ref 1.5–4.5)
Glucose: 97 mg/dL (ref 65–99)
Potassium: 4.5 mmol/L (ref 3.5–5.2)
Sodium: 141 mmol/L (ref 134–144)
Total Protein: 6.5 g/dL (ref 6.0–8.5)
eGFR: 112 mL/min/{1.73_m2} (ref >59)

## 2020-12-30 LAB — CBC WITH DIFFERENTIAL/PLATELET
Basophils Absolute: 0.1 x10E3/uL (ref 0.0–0.2)
Basos: 1 %
EOS (ABSOLUTE): 0.1 x10E3/uL (ref 0.0–0.4)
Eos: 2 %
Hematocrit: 42.8 % (ref 34.0–46.6)
Hemoglobin: 14.6 g/dL (ref 11.1–15.9)
Immature Grans (Abs): 0 x10E3/uL (ref 0.0–0.1)
Immature Granulocytes: 0 %
Lymphocytes Absolute: 2.1 x10E3/uL (ref 0.7–3.1)
Lymphs: 35 %
MCH: 28.3 pg (ref 26.6–33.0)
MCHC: 34.1 g/dL (ref 31.5–35.7)
MCV: 83 fL (ref 79–97)
Monocytes Absolute: 0.5 x10E3/uL (ref 0.1–0.9)
Monocytes: 8 %
Neutrophils Absolute: 3.3 x10E3/uL (ref 1.4–7.0)
Neutrophils: 54 %
Platelets: 321 x10E3/uL (ref 150–450)
RBC: 5.16 x10E6/uL (ref 3.77–5.28)
RDW: 12.8 % (ref 11.7–15.4)
WBC: 6 x10E3/uL (ref 3.4–10.8)

## 2020-12-30 LAB — LIPID PANEL
Chol/HDL Ratio: 4.4 ratio (ref 0.0–4.4)
Cholesterol, Total: 186 mg/dL (ref 100–199)
HDL: 42 mg/dL (ref >39)
LDL Chol Calc (NIH): 127 mg/dL — ABNORMAL HIGH (ref 0–99)
Triglycerides: 92 mg/dL (ref 0–149)
VLDL Cholesterol Cal: 17 mg/dL (ref 5–40)

## 2020-12-30 LAB — CARDIOVASCULAR RISK ASSESSMENT

## 2020-12-30 MED ORDER — ZOLPIDEM TARTRATE 10 MG PO TABS: 10.0000 mg | ORAL_TABLET | Freq: Every evening | ORAL | 1 refills | Status: DC | PRN

## 2020-12-30 MED FILL — ZOLPIDEM TARTRATE 10 MG TAB: 10 | 90 days supply | Qty: 90 | Fill #0

## 2021-01-01 ENCOUNTER — Other Ambulatory Visit (HOSPITAL_BASED_OUTPATIENT_CLINIC_OR_DEPARTMENT_OTHER): Payer: Self-pay

## 2021-01-11 ENCOUNTER — Encounter: Payer: Self-pay | Admitting: Family Medicine

## 2021-02-04 ENCOUNTER — Other Ambulatory Visit: Payer: Self-pay | Admitting: Family Medicine

## 2021-02-04 MED ORDER — VORTIOXETINE HBR 20 MG PO TABS
20.0000 mg | ORAL_TABLET | Freq: Every day | ORAL | 0 refills | Status: DC
  Filled 2021-02-04: qty 90, 90d supply, fill #0

## 2021-02-04 MED ORDER — BUSPIRONE HCL 15 MG PO TABS
15.0000 mg | ORAL_TABLET | Freq: Two times a day (BID) | ORAL | 0 refills | Status: DC
Start: 2021-02-04 — End: 2021-09-01
  Filled 2021-02-04: qty 180, 90d supply, fill #0

## 2021-02-05 ENCOUNTER — Other Ambulatory Visit (HOSPITAL_COMMUNITY): Payer: Self-pay

## 2021-02-09 ENCOUNTER — Ambulatory Visit (INDEPENDENT_AMBULATORY_CARE_PROVIDER_SITE_OTHER): Payer: No Typology Code available for payment source

## 2021-02-09 DIAGNOSIS — Z23 Encounter for immunization: Secondary | ICD-10-CM

## 2021-02-09 NOTE — Progress Notes (Signed)
   Covid-19 Vaccination Clinic  Name:  EVANGELYN CROUSE    MRN: [id number] DOB: [birth year]  02/09/2021  Ms. Barkett was observed post Covid-19 immunization for 15 minutes without incident. She was provided with Vaccine Information Sheet and instruction to access the V-Safe system.   Ms. Kastens was instructed to call [phone] with any severe reactions post vaccine: Marland Kitchen Difficulty breathing  . Swelling of face and throat  . A fast heartbeat  . A bad rash all over body  . Dizziness and weakness

## 2021-02-19 ENCOUNTER — Other Ambulatory Visit: Payer: Self-pay

## 2021-02-19 ENCOUNTER — Other Ambulatory Visit (HOSPITAL_COMMUNITY): Payer: Self-pay

## 2021-02-19 MED ORDER — AMPHETAMINE-DEXTROAMPHET ER 25 MG PO CP24
ORAL_CAPSULE | Freq: Every morning | ORAL | 0 refills | Status: DC
  Filled 2021-02-19: qty 30, 30d supply, fill #0

## 2021-04-01 ENCOUNTER — Other Ambulatory Visit (HOSPITAL_COMMUNITY): Payer: Self-pay

## 2021-04-01 ENCOUNTER — Other Ambulatory Visit: Payer: Self-pay | Admitting: Nurse Practitioner

## 2021-04-01 DIAGNOSIS — F900 Attention-deficit hyperactivity disorder, predominantly inattentive type: Secondary | ICD-10-CM

## 2021-04-01 MED ORDER — AMPHETAMINE-DEXTROAMPHET ER 25 MG PO CP24
ORAL_CAPSULE | Freq: Every morning | ORAL | 0 refills | Status: DC
  Filled 2021-04-01: qty 30, 30d supply, fill #0

## 2021-04-01 MED FILL — Zolpidem Tartrate Tab 10 MG: ORAL | 90 days supply | Qty: 90 | Fill #0 | Status: AC

## 2021-04-16 ENCOUNTER — Other Ambulatory Visit: Payer: Self-pay | Admitting: Nurse Practitioner

## 2021-04-16 DIAGNOSIS — Z0000 Encounter for general adult medical examination without abnormal findings: Secondary | ICD-10-CM

## 2021-04-17 LAB — LIPID PANEL
Chol/HDL Ratio: 4.1 ratio (ref 0.0–4.4)
Cholesterol, Total: 199 mg/dL (ref 100–199)
HDL: 49 mg/dL (ref >39)
LDL Chol Calc (NIH): 128 mg/dL — ABNORMAL HIGH (ref 0–99)
Triglycerides: 125 mg/dL (ref 0–149)
VLDL Cholesterol Cal: 22 mg/dL (ref 5–40)

## 2021-04-17 LAB — TSH: TSH: 1.68 u[IU]/mL (ref 0.450–4.500)

## 2021-04-17 LAB — COMPREHENSIVE METABOLIC PANEL
ALT: 27 IU/L (ref 0–32)
AST: 21 IU/L (ref 0–40)
Albumin/Globulin Ratio: 2.5 — ABNORMAL HIGH (ref 1.2–2.2)
Albumin: 4.5 g/dL (ref 3.8–4.8)
Alkaline Phosphatase: 91 IU/L (ref 44–121)
BUN/Creatinine Ratio: 19 (ref 9–23)
BUN: 13 mg/dL (ref 6–24)
Bilirubin Total: 0.5 mg/dL (ref 0.0–1.2)
CO2: 24 mmol/L (ref 20–29)
Calcium: 9.1 mg/dL (ref 8.7–10.2)
Chloride: 100 mmol/L (ref 96–106)
Creatinine, Ser: 0.7 mg/dL (ref 0.57–1.00)
Globulin, Total: 1.8 g/dL (ref 1.5–4.5)
Glucose: 91 mg/dL (ref 65–99)
Potassium: 4 mmol/L (ref 3.5–5.2)
Sodium: 141 mmol/L (ref 134–144)
Total Protein: 6.3 g/dL (ref 6.0–8.5)
eGFR: 111 mL/min/{1.73_m2} (ref >59)

## 2021-04-17 LAB — CBC WITH DIFFERENTIAL/PLATELET
Basophils Absolute: 0 10*3/uL (ref 0.0–0.2)
Basos: 1 %
EOS (ABSOLUTE): 0.1 10*3/uL (ref 0.0–0.4)
Eos: 1 %
Hematocrit: 40.6 % (ref 34.0–46.6)
Hemoglobin: 14 g/dL (ref 11.1–15.9)
Immature Grans (Abs): 0 10*3/uL (ref 0.0–0.1)
Immature Granulocytes: 0 %
Lymphocytes Absolute: 1.7 10*3/uL (ref 0.7–3.1)
Lymphs: 31 %
MCH: 29.3 pg (ref 26.6–33.0)
MCHC: 34.5 g/dL (ref 31.5–35.7)
MCV: 85 fL (ref 79–97)
Monocytes Absolute: 0.3 10*3/uL (ref 0.1–0.9)
Monocytes: 5 %
Neutrophils Absolute: 3.3 10*3/uL (ref 1.4–7.0)
Neutrophils: 62 %
Platelets: 293 10*3/uL (ref 150–450)
RBC: 4.78 x10E6/uL (ref 3.77–5.28)
RDW: 12.9 % (ref 11.7–15.4)
WBC: 5.4 10*3/uL (ref 3.4–10.8)

## 2021-04-17 LAB — VITAMIN D 25 HYDROXY (VIT D DEFICIENCY, FRACTURES): Vit D, 25-Hydroxy: 39.9 ng/mL (ref 30.0–100.0)

## 2021-04-17 LAB — CARDIOVASCULAR RISK ASSESSMENT

## 2021-04-17 LAB — B12 AND FOLATE PANEL
Folate: 6.4 ng/mL (ref >3.0)
Vitamin B-12: 457 pg/mL (ref 232–1245)

## 2021-04-20 ENCOUNTER — Encounter: Payer: Self-pay | Admitting: Nurse Practitioner

## 2021-04-20 ENCOUNTER — Ambulatory Visit (INDEPENDENT_AMBULATORY_CARE_PROVIDER_SITE_OTHER): Payer: No Typology Code available for payment source | Admitting: Nurse Practitioner

## 2021-04-20 VITALS — BP 110/78 | HR 77 | Temp 97.8°F | Ht 65.0 in | Wt 156.4 lb

## 2021-04-20 DIAGNOSIS — F988 Other specified behavioral and emotional disorders with onset usually occurring in childhood and adolescence: Secondary | ICD-10-CM

## 2021-04-20 DIAGNOSIS — E782 Mixed hyperlipidemia: Secondary | ICD-10-CM | POA: Diagnosis not present

## 2021-04-20 DIAGNOSIS — R5383 Other fatigue: Secondary | ICD-10-CM

## 2021-04-20 DIAGNOSIS — F5101 Primary insomnia: Secondary | ICD-10-CM

## 2021-04-20 DIAGNOSIS — E8940 Asymptomatic postprocedural ovarian failure: Secondary | ICD-10-CM

## 2021-04-20 DIAGNOSIS — F411 Generalized anxiety disorder: Secondary | ICD-10-CM

## 2021-04-20 NOTE — Patient Instructions (Addendum)
Increase physical activity Begin Vit D and Calcium gummies We will call you with lab results Follow-up with neurology for bilateral arm weakness Follow-up 106-month, fasting  Preventing Vitamin D Deficiency Vitamin D is a nutrient that helps your body absorb calcium from food. It plays a key role in the health of bones and teeth, muscle function, and infectionprevention. Our bodies make vitamin D when our skin is exposed to direct sunlight. However, for many people, this may not be enough vitamin D to meet the body's needs.When you get too little vitamin D, it is called a deficiency. How can this condition affect me? A vitamin D deficiency can put you at risk of developing conditions that cause bones to be brittle, such as rickets or osteoporosis. If you are over age 353 not having enough vitamin D may weaken your muscles and bones and increase yourrisk for falls and broken bones. What can increase my risk? You may be at risk for a vitamin D deficiency if you: Are pregnant. Are obese. Are over 626years old. Have dark skin. Take certain medicines that affect the way vitamin D is absorbed. Have had gastric bypass surgery. Other risk factors include: Having a condition that limits your ability to absorb fat, such as cystic fibrosis, celiac disease, or inflammatory bowel disease. Having certain inherited conditions. Not having access to foods rich in vitamin D. Having limited ability to move. Living in areas that have fewer hours of sunlight. Spending most of your day indoors, or you cover your skin all the time when you are outdoors. Breastfed infants are also at risk for vitamin D deficiency. What actions can I take to reduce my risk of a vitamin D deficiency? Knowing the best sources of vitamin D You can meet your daily vitamin D needs from: Foods. Dietary supplements. Direct exposure to natural sunlight. Infant formula (for babies). Knowing how much vitamin D you need General  recommendations for daily vitamin D intake vary by these categories: Infants: 400 International Units. Children over 144year old: 600 International Units. Adults: 600 International Units. Pregnant and breastfeeding women: 600 International Units. Adults over 727years old: 821International Units. These are minimum levels of recommended amounts. Your health care provider may recommend a different amount of vitamin D intake based on your specific needsand your overall health. Getting sun exposure Get regular, safe exposure to natural sunlight. Expose your skin to direct sunlight for at least 15 minutes every day. If you have dark skin, you may need to expose your skin for a longer period of time. Protect your skin from too much sun exposure. This helps to prevent skin cancer. Ask your health care provider if regular sun exposure is safe for you. Do not use a tanning bed. Eating and drinking  Eat foods that naturally contain vitamin D. These include: Beef liver. Egg yolk. Fatty fish, such as cod, salmon, trout, swordfish, shrimp, sardines, and tuna. Cheese. Mushrooms. Oysters. Eat or drink products that have been fortified with vitamin D. Fortified means that vitamin D has been added to the food. These may include: Cereals. Dairy products, such as milk, yogurt, butter, or margarine. Orange juice. Alternative milks, such as soy milk or almond milk. When choosing foods, check the food label on the package to see: How much vitamin D is in the item. If the food is fortified with vitamin D. Although it is hard to get your vitamin D requirement from foods alone, you should eat a balanced diet each day that  includes foods naturally higher in vitamin D or fortified with it. Try to include the following in your diet each day: 2-3 servings of meat or meat alternatives. 2-3 servings of dairy.  Taking supplements If you are at risk for vitamin D deficiency, or if you have certain diseases, your  health care provider may recommend that you take a vitamin D supplement. Make sure you: Talk with your health care provider before you start taking any vitamin D supplements. You may be more sensitive to the side effects of vitamin D supplements if you are on certain medicines or have certain medical conditions. Tell your health care provider about all medicines you are taking, including vitamin, mineral, and herbal supplements. Take medicines and supplements only as told by your health care provider. Summary Vitamin D is a nutrient that helps your body absorb calcium from food. A vitamin D deficiency can put you at risk of developing conditions that cause bones to be brittle, such as rickets or osteoporosis. Our bodies make vitamin D when our skin is exposed to direct sunlight. However, for many people, this may not be enough vitamin D to meet the body's needs. Some foods naturally contain vitamin D, including beef liver, egg yolk, and fatty fish. Products may also be fortified with vitamin D. Fortified means that vitamin D has been added to the food. This information is not intended to replace advice given to you by your health care provider. Make sure you discuss any questions you have with your healthcare provider. Document Revised: 06/20/2019 Document Reviewed: 09/22/2018 Elsevier Patient Education  2022 [place].   Calcium; Vitamin D oral tablets What is this medication? CALCIUM; VITAMIN D (KAL see um; VYE ta min D) is a vitamin supplement. It isused to prevent conditions of low calcium and vitamin D. This medicine may be used for other purposes; ask your health care provider orpharmacist if you have questions. COMMON BRAND NAME(S): Calcarb 600 with Vitamin D, Calcet Plus Vitamin D, Calcitrate + D, Calcium Citrate + D3 Maximum, Calcium Citrate Maximum with D, Caltrate, Caltrate 600+D, Citracal + D, Citracal MAXIMUM + D, Citracal Petites with Vitamin D, Citrus Calcium Plus D, OSCAL 500 +  D, OSCAL Calcium + D3, OSCALExtra D3, Osteo-Poretical, Oysco 500 + D, Oysco D, Oystercal-D Calcium What should I tell my care team before I take this medication? They need to know if you have any of these conditions: constipation dehydration heart disease high level of calcium or vitamin D in the blood high level of phosphate in the blood kidney disease kidney stones liver disease parathyroid disease sarcoidosis stomach ulcer or obstruction an unusual or allergic reaction to calcium, vitamin D, tartrazine dye, other medicines, foods, dyes, or preservatives pregnant or trying to get pregnant breast-feeding How should I use this medication? Take this medicine by mouth with a glass of water. Follow the directions on the label. Take with food or within 1 hour after a meal. Take your medicine atregular intervals. Do not take your medicine more often than directed. Talk to your pediatrician regarding the use of this medicine in children. While this medicine may be used in children for selected conditions, precautions doapply. Overdosage: If you think you have taken too much of this medicine contact apoison control center or emergency room at once. NOTE: This medicine is only for you. Do not share this medicine with others. What if I miss a dose? If you miss a dose, take it as soon as you can. If it is  almost time for yournext dose, take only that dose. Do not take double or extra doses. What may interact with this medication? Do not take this medicine with any of the following medications: ammonium chloride methenamine This medicine may also interact with the following medications: antibiotics like ciprofloxacin, gatifloxacin, tetracycline captopril delavirdine diuretics gabapentin iron supplements medicines for fungal infections like ketoconazole and itraconazole medicines for seizures like ethotoin and phenytoin mineral oil mycophenolate other vitamins with calcium, vitamin D, or  minerals quinidine rosuvastatin sucralfate thyroid medicine This list may not describe all possible interactions. Give your health care provider a list of all the medicines, herbs, non-prescription drugs, or dietary supplements you use. Also tell them if you smoke, drink alcohol, or use illegaldrugs. Some items may interact with your medicine. What should I watch for while using this medication? Taking this medicine is not a substitute for a well-balanced diet and exercise.Talk with your doctor or health care provider and follow a healthy lifestyle. Do not take this medicine with high-fiber foods, large amounts of alcohol, or drinks containing caffeine. Do not take this medicine within 2 hours of anyother medicines. What side effects may I notice from receiving this medication? Side effects that you should report to your doctor or health care professionalas soon as possible: allergic reactions like skin rash, itching or hives, swelling of the face, lips, or tongue confusion dry mouth high blood pressure increased hunger or thirst increased urination irregular heartbeat metallic taste muscle or bone pain pain when urinating seizure unusually weak or tired weight loss Side effects that usually do not require medical attention (report to yourdoctor or health care professional if they continue or are bothersome): constipation diarrhea headache loss of appetite nausea, vomiting stomach upset This list may not describe all possible side effects. Call your doctor for medical advice about side effects. You may report side effects to FDA at1-800-FDA-1088. Where should I keep my medication? Keep out of the reach of children. Store at room temperature between 15 and 30 degrees C (59 and 86 degrees F). Protect from light. Keep container tightly closed. Throw away any unusedmedicine after the expiration date. NOTE: This sheet is a summary. It may not cover all possible information. If you have  questions about this medicine, talk to your doctor, pharmacist, orhealth care provider.  2022 Elsevier/Gold Standard (2008-01-10 17:56:23) Preventing Osteoporosis, Adult Osteoporosis is a condition that causes the bones to lose density. This means that the bones become thinner, and the normal spaces in bone tissue become larger. Low bone density can make the bones weak and cause them to break moreeasily. Osteoporosis cannot always be prevented, but you can take steps to lower yourrisk of developing this condition. How can this condition affect me? If you develop osteoporosis, you will be more likely to break bones in your wrist, spine, or hip. Even a minor accident or injury can be enough to break weak bones. The bones will also be slower to heal. Osteoporosis can cause otherproblems as well, such as a stooped posture or trouble with movement. Osteoporosis can occur with aging. As you get older, you may lose bone tissue more quickly, or it may be replaced more slowly. Osteoporosis is more likely to develop if you have poor nutrition or do not get enough calcium or vitamin D. Other lifestyle factors can also play a role. By eating a well-balanced diet and making lifestyle changes, you can help keep your bones strong and healthy,lowering your chances of developing osteoporosis. What can increase my  risk? The following factors may make you more likely to develop osteoporosis: Having a family history of the condition. Having poor nutrition or not getting enough calcium or vitamin D. Using certain medicines, such as steroid medicines or anti-seizure medicines. Being any of the following: 24 years of age or older. Female. A woman who has gone through menopause (is postmenopausal). A person who is of European or Asian descent. Using products that contain nicotine or tobacco, such as cigarettes, e-cigarettes, and chewing tobacco. Not being physically active (being sedentary). Having a small body  frame. What actions can I take to prevent this? Get enough calcium  Make sure you get enough calcium every day. Calcium is the most important mineral for bone health. Most people can get enough calcium from their diet, but supplements may be recommended for people who are at risk for osteoporosis. Follow these guidelines: If you are age 52 or younger, aim to get 1,000 milligrams (mg) of calcium every day. If you are older than age 59, aim to get 1,200 mg of calcium every day. Good sources of calcium include: Dairy products, such as low-fat or nonfat milk, cheese, and yogurt. Dark green leafy vegetables, such as bok choy and broccoli. Foods that have had calcium added to them (calcium-fortified foods), such as orange juice, cereal, bread, soy beverages, and tofu products. Nuts, such as almonds. Check nutrition labels to see how much calcium is in a food or drink.  Get enough vitamin D Try to get enough vitamin D every day. Vitamin D is the most essential vitamin for bone health. It helps the body absorb calcium. Follow these guidelines for how much vitamin D to get from food: If you are age 94 or younger, aim to get at least 600 international units (IU) every day. Your health care provider may suggest more. If you are older than age 35, aim to get at least 800 international units every day. Your health care provider may suggest more. Good sources of vitamin D in your diet include: Egg yolks. Oily fish, such as salmon, sardines, and tuna. Milk and cereal fortified with vitamin D. Your body also makes vitamin D when you are out in the sun. Exposing the bare skin on your face, arms, legs, or back to the sun for no more than 30 minutes a day, 2 times a week is more than enough. Beyond that, make sure you use sunblock to protect your skin from sunburn, which increases your risk for skin cancer. Exercise  Stay active and get exercise every day. Ask your health care provider what types of exercise  are best for you. Weight-bearing and strength-building activities are important for building and maintaining healthy bones. Some examples of these types of activities include: Walking and hiking. Jogging and running. Dancing. Gym exercises and lifting weights. Tennis and racquetball. Climbing stairs. Tai chi.  Make other lifestyle changes Do not use any products that contain nicotine or tobacco, such as cigarettes, e-cigarettes, and chewing tobacco. If you need help quitting, ask your health care provider. Lose weight if you are overweight. If you drink alcohol: Limit how much you use to: 0-1 drink a day for women who are not pregnant. 0-2 drinks a day for men. Be aware of how much alcohol is in your drink. In the [place] one drink equals one 12 oz bottle of beer (355 mL), one 5 oz glass of wine (148 mL), or one 1 oz glass of hard liquor (44 mL). Where to find support If  you need help making changes to prevent osteoporosis, talk with your health care provider. You can ask for a referral to a dietitian and a physicaltherapist. Where to find more information Learn more about osteoporosis from: NIH Osteoporosis and Related [place]: www.bones.SouthExposed.es [place].S. Office on [employer] Health: [place].tn [place]: EquipmentWeekly.com.ee Summary Osteoporosis is a condition that causes weak bones that are more likely to break. Eat a healthy diet, making sure you get enough calcium and vitamin D, and stay active by getting regular exercise to help prevent osteoporosis. Other ways to reduce your risk of osteoporosis include maintaining a healthy weight and avoiding alcohol and products that contain nicotine or tobacco. This information is not intended to replace advice given to you by your health care provider. Make sure you discuss any questions you have with your healthcare provider. Document Revised: 03/13/2020 Document Reviewed: 03/13/2020 Elsevier  Patient Education  [place].

## 2021-04-20 NOTE — Progress Notes (Signed)
Subjective:  Patient ID: Sheri Moore, female    DOB: 05-29-1981  Age: 40 y.o. MRN: [id number]  Chief Complaint  Patient presents with   Hyperlipidemia   Anxiety   Insomnia     Lipid/Cholesterol, Follow-up  Last lipid panel Other pertinent labs  Lab Results  Component Value Date   CHOL 199 04/16/2021   HDL 49 04/16/2021   LDLCALC 128 (H) 04/16/2021   TRIG 125 04/16/2021   CHOLHDL 4.1 04/16/2021   Lab Results  Component Value Date   ALT 27 04/16/2021   AST 21 04/16/2021   PLT 293 04/16/2021   TSH 1.680 04/16/2021     She was last seen for this 3 months ago.  Management since that visit includes diet controlled.  She reports excellent compliance with treatment. She is not having side effects.   Symptoms: No chest pain No chest pressure/discomfort  No dyspnea No lower extremity edema  No numbness or tingling of extremity No orthopnea  No palpitations No paroxysmal nocturnal dyspnea  No speech difficulty No syncope   Current diet: in general, a "healthy" diet   Current exercise: walking  The 10-year ASCVD risk score Mikey Bussing DC Jr., et al., 2013) is: 0.6%   Anxiety, Follow-up  She was last seen for anxiety 3 months ago. Changes made at last visit include no changes, continued Buspar 15 mg BID.   She reports excellent compliance with treatment. She reports excellent tolerance of treatment. She is not having side effects.   She feels her anxiety is mild and Unchanged since last visit.  Symptoms: No chest pain No difficulty concentrating  No dizziness Yes fatigue  No feelings of losing control No insomnia  No irritable No palpitations  No panic attacks No racing thoughts  No shortness of breath No sweating  No tremors/shakes    GAD-7 Results GAD-7 Generalized Anxiety Disorder Screening Tool 04/20/2021  1. Feeling Nervous, Anxious, or on Edge 0  2. Not Being Able to Stop or Control Worrying 0  3. Worrying Too Much About Different Things 0  4. Trouble  Relaxing 0  5. Being So Restless it's Hard To Sit Still 0  6. Becoming Easily Annoyed or Irritable 2  7. Feeling Afraid As If Something Awful Might Happen 0  Total GAD-7 Score 2  Difficulty At Work, Home, or Getting  Along With Others? Not difficult at all    PHQ-9 Scores PHQ9 SCORE ONLY 04/20/2021 09/30/2020 05/28/2020  PHQ-9 Total Score 6 2 7     Current Outpatient Medications on File Prior to Visit  Medication Sig Dispense Refill   amphetamine-dextroamphetamine (ADDERALL XR) 25 MG 24 hr capsule TAKE 1 CAPSULE BY MOUTH EVERY MORNING. 30 capsule 0   busPIRone (BUSPAR) 15 MG tablet Take 1 tablet (15 mg total) by mouth 2 (two) times daily. 180 tablet 0   LORazepam (ATIVAN) 0.5 MG tablet Take 1 tablet (0.5 mg total) by mouth every 8 (eight) hours. 30 tablet 1   vortioxetine HBr (TRINTELLIX) 20 MG TABS tablet Take 1 tablet (20 mg total) by mouth daily. 90 tablet 0   zolpidem (AMBIEN) 10 MG tablet TAKE 1 TABLET BY MOUTH ONCE DAILY AT BEDTIME AS NEEDED FOR SLEEP 90 tablet 1   No current facility-administered medications on file prior to visit.   Past Medical History:  Diagnosis Date   Anxiety    Depression    Fibroid    Gastroschisis    Past Surgical History:  Procedure Laterality Date   ABDOMINAL HYSTERECTOMY  supracervical hysterectomy   ABDOMINAL SURGERY     OOPHORECTOMY      Family History  Problem Relation Age of Onset   Uterine cancer Mother    Diabetes Mother    Hypertension Mother    Thyroid disease Mother    Skin cancer Father    Subarachnoid hemorrhage Father    Diabetes Maternal Grandmother    Hypertension Maternal Grandmother    Diabetes Paternal Grandfather    Breast cancer Neg Hx    Social History   Socioeconomic History   Marital status: Married    Spouse name: Not on file   Number of children: 1   Years of education: College   Highest education level: Not on file  Occupational History   Occupation: [employer]  Tobacco Use   Smoking  status: Former    Pack years: 0.00    Types: Cigarettes    Quit date: 10/11/2001    Years since quitting: 19.5   Smokeless tobacco: Never  Substance and Sexual Activity   Alcohol use: Yes    Comment: occ   Drug use: No   Sexual activity: Yes    Partners: Female    Birth control/protection: Surgical    Comment: supracervical hysterectomy  Other Topics Concern   Not on file  Social History Narrative   Lives with husband and son   Caffeine use: rarely   Right handed    Social Determinants of Health   Financial Resource Strain: Not on file  Food Insecurity: Not on file  Transportation Needs: Not on file  Physical Activity: Not on file  Stress: Not on file  Social Connections: Not on file    Review of Systems  Constitutional:  Positive for fatigue. Negative for appetite change and fever.  HENT:  Negative for congestion, ear pain, sinus pressure and sore throat.   Eyes:  Negative for pain.  Respiratory:  Negative for cough, chest tightness, shortness of breath and wheezing.   Cardiovascular:  Negative for chest pain and palpitations.  Gastrointestinal:  Negative for abdominal pain, constipation, diarrhea, nausea and vomiting.  Genitourinary:  Negative for dysuria and hematuria.  Musculoskeletal:  Negative for arthralgias, back pain, joint swelling and myalgias.  Skin:  Negative for rash.  Neurological:  Positive for weakness (Bilateral arms). Negative for dizziness and headaches.  Psychiatric/Behavioral:  Negative for dysphoric mood. The patient is not nervous/anxious.     Objective:  BP 110/78 (BP Location: Left Arm, Patient Position: Sitting)   Pulse 77   Temp 97.8 F (36.6 C) (Temporal)   Ht 5\' 5"  (1.651 m)   Wt 156 lb 6.4 oz (70.9 kg)   SpO2 99%   BMI 26.03 kg/m   BP/Weight 04/20/2021 12/30/2020 2/37/6283  Systolic BP 151 761 607  Diastolic BP 78 80 78  Wt. (Lbs) 156.4 162.8 -  BMI 26.03 27.09 -    Physical Exam Vitals reviewed.  Constitutional:       Appearance: Normal appearance.  HENT:     Right Ear: Tympanic membrane, ear canal and external ear normal.     Left Ear: Tympanic membrane, ear canal and external ear normal.     Nose: Nose normal.     Mouth/Throat:     Mouth: Mucous membranes are moist.  Cardiovascular:     Rate and Rhythm: Normal rate and regular rhythm.     Pulses: Normal pulses.     Heart sounds: Normal heart sounds.  Pulmonary:     Effort: Pulmonary effort is  normal.     Breath sounds: Normal breath sounds.  Abdominal:     Palpations: Abdomen is soft.  Musculoskeletal:        General: Normal range of motion.     Cervical back: Normal range of motion.  Skin:    General: Skin is warm and dry.  Neurological:     Mental Status: She is alert and oriented to person, place, and time.  Psychiatric:        Mood and Affect: Mood normal.        Behavior: Behavior normal.        Thought Content: Thought content normal.        Judgment: Judgment normal.     Lab Results  Component Value Date   WBC 5.4 04/16/2021   HGB 14.0 04/16/2021   HCT 40.6 04/16/2021   PLT 293 04/16/2021   GLUCOSE 91 04/16/2021   CHOL 199 04/16/2021   TRIG 125 04/16/2021   HDL 49 04/16/2021   LDLCALC 128 (H) 04/16/2021   ALT 27 04/16/2021   AST 21 04/16/2021   NA 141 04/16/2021   K 4.0 04/16/2021   CL 100 04/16/2021   CREATININE 0.70 04/16/2021   BUN 13 04/16/2021   CO2 24 04/16/2021   TSH 1.680 04/16/2021      Assessment & Plan:     1. Mixed hyperlipidemia-improved -Lipid panel - 2. GAD (generalized anxiety disorder)-well controlled -continue Buspar 15 mg BID  3. Attention deficit disorder (ADD) without hyperactivity -continue Adderall 25 mg QD  4. Primary insomnia-well controlled -continue Ambien  5. Surgical menopause -vitamin D and calcium  6. Other fatigue - Methylmalonic Acid, Serum - ANA w/Reflex    Increase physical activity Begin Vit D and Calcium gummies We will call you with lab results Follow-up  with neurology for bilateral arm weakness Follow-up 61-months, fasting      Follow-up: 16-months  An After Visit Summary was printed and given to the patient.    I,Lauren M Auman,acting as a Education administrator for [employer], NP.,have documented all relevant documentation on the behalf of Rip Harbour, NP,as directed by  Rip Harbour, NP while in the presence of Rip Harbour, NP.   I, Rip Harbour, NP, have reviewed all documentation for this visit. The documentation on 04/20/21 for the exam, diagnosis, procedures, and orders are all accurate and complete.   Signed, Rip Harbour, NP [place] [phone]

## 2021-04-21 ENCOUNTER — Other Ambulatory Visit: Payer: Self-pay | Admitting: Nurse Practitioner

## 2021-04-21 DIAGNOSIS — R29898 Other symptoms and signs involving the musculoskeletal system: Secondary | ICD-10-CM

## 2021-04-22 LAB — ANA W/REFLEX: Anti Nuclear Antibody (ANA): NEGATIVE

## 2021-04-23 ENCOUNTER — Encounter: Payer: Self-pay | Admitting: Psychology

## 2021-04-23 LAB — METHYLMALONIC ACID, SERUM: Methylmalonic Acid: 84 nmol/L (ref 0–378)

## 2021-04-27 ENCOUNTER — Encounter: Payer: Self-pay | Admitting: Family Medicine

## 2021-04-29 ENCOUNTER — Encounter: Payer: Self-pay | Admitting: Neurology

## 2021-04-29 ENCOUNTER — Other Ambulatory Visit: Payer: Self-pay

## 2021-04-29 ENCOUNTER — Ambulatory Visit (INDEPENDENT_AMBULATORY_CARE_PROVIDER_SITE_OTHER): Payer: No Typology Code available for payment source | Admitting: Neurology

## 2021-04-29 VITALS — BP 118/78 | HR 106 | Ht 65.0 in | Wt 155.0 lb

## 2021-04-29 DIAGNOSIS — R200 Anesthesia of skin: Secondary | ICD-10-CM

## 2021-04-29 DIAGNOSIS — R29898 Other symptoms and signs involving the musculoskeletal system: Secondary | ICD-10-CM

## 2021-04-29 DIAGNOSIS — R9082 White matter disease, unspecified: Secondary | ICD-10-CM

## 2021-04-29 DIAGNOSIS — R5383 Other fatigue: Secondary | ICD-10-CM

## 2021-04-29 DIAGNOSIS — F418 Other specified anxiety disorders: Secondary | ICD-10-CM | POA: Diagnosis not present

## 2021-04-29 DIAGNOSIS — R4189 Other symptoms and signs involving cognitive functions and awareness: Secondary | ICD-10-CM

## 2021-04-29 NOTE — Progress Notes (Signed)
[facility]  PATIENT: Sheri Moore DOB: [birth year]  REFERRING DOCTOR OR PCP: Rochel Brome, MD SOURCE: Patient, notes from Dr. Tobie Poet, notes from Dr. Jacqulynn Cadet, imaging, study and lab reports, multiple MRI images personally reviewed  _________________________________   HISTORICAL  CHIEF COMPLAINT:  Chief Complaint  Patient presents with   Follow-up    Rm 1, alone. Pt here today for f/u visit. Pt states last couple weeks, she has noticed heaviness in both arms. Fatigue and no energy. Pt states she has trouble remembering things and loss of words.     HISTORY OF PRESENT ILLNESS:  Sheri Moore is a 40 y.o. woman with numbness, fatigue, cognitive changes and abnormal brain MRI.  Update 04/29/2021: Over the last 2-3 weeks she has felt a heaviness in her arms.  Occasioanlly she gets tingling in theleft amr to the 3rd/4th fingers.   Gait and balance are fine.   These symptoms are similar to what she experienced in 2020 but she had imporved for many months before symptoms returned    She is noting continued cognitive issues with delayed processing (mostly when other people talk to her).  She usually does better coming up with words.    Nuvigil helped her some initially but not after a while and she stopped.    Adderall has helped her some.    She sleeps well on Ambien.   She snores some but less since losing weight.  She has never been told of OSA signs.  She feels refreshed when she wakes up.   She sometimes dozes off watching TV at night but not earlier in day.     Mood is better on Buspar with Trintellix (Dr. Tobie Poet writes).    Despite the imaging report stating probable MS, changes are more c/w chronic microvascular ischemic change.  MRI was abnl in 2004 and only minimal progression over the years.  Recent labs 04/2021:  ANA, B12, Vit D, TSH, CMP , CBC all normal or noncontributary  EPWORTH SLEEPINESS SCALE  On a scale of 0 - 3 what is the chance of  dozing:  Sitting and Reading:  0 Watching TV:   1 Sitting inactive in a public place: 0 Passenger in car for one hour: 0 Lying down to rest in the afternoon: 3 Sitting and talking to someone: 0 Sitting quietly after lunch:  0 In a car, stopped in traffic:  0  Total (out of 24):   3424 (normal)   Update 12/12/2018: The last visit she was started on armodafinil for fatigue and she feels there is some benefits.  She continues to note tingling in the left arm, lips and throat.   She feels the tingling is about the same.  The tingling is not painful ut is a nuisance.    The tingling is in the 4th and 5th fingers of the left hand.    Tingling is 24/7 without much fluctuations.   She has a left Tinel's sign.    Since the last visit, she had an echocardiogram.  The Echocardiogram 11/17/2018 was essentially normal.  Specifically there was no evidence of PFO or other shunt.  Additionally, 11/15/2018, she had an MRI of the cervical spine that was normal without spinal cord lesions or significant degenerative change.    The MRI of the brain shows white matter foci but they are nonspecific and more consistent with chronic microvascular ischemic change than with demyelination.  She had had white matter abnormalities as early as 2004 and  had a normal lumbar puncture at that time.  From 11/09/2018 Ms. Dixson is a 40 year old woman who began to experience vertigo and headaches about 2004.    She had a brain MRI performedshowing white matter abnormalities and saw Dr. Jannifer Franklin.   An LP was performed and CSF was normal in 2004.    She had a post-LP headache and a blood patch was needed (03/11/2003).  She was referred to Dr. Jacqulynn Cadet.       At some point in time a second LP was performed and it was also negative.    She was seen by Dr. Dellis Filbert at [facility] between 2006 and 2009 and had a couple of MRI scans performed.  Besides the vertigo, she also reported a mental fog around that time with reduced focus and attention.    She was not definitively diagnosed with multiple sclerosis.  Around October 2019, she had the onset of lip tingling bilaterally and then mor eof the face and the left arm had tingling.     The arm tingling is constant while the facial tingling is intermittent.   The right arm is sometimes feeling heavy but no tingling/numbness.   Legs are fine.   Her bladder and bowel function are unchanged.   Vision acuity is fine with glasses.   She has symmetric color vision.  She has borderline elevated IOP.      She has had fatigue x many years, worse in the heat (like at ball games) and will sometimes feel too tired to do more activities later in the day.    She is falling asleep most nights and gets 8 hours of sleep most nights.  She has both depression and anxiety that worsened last year.   She was recently switched to Trintellix from Cymbalta and citalopram.     She feels memory is poor.  Focus and attention is poor.   She did not have ADD signs/symptoms as a child or teen.   She feels the cognitive issues have progressed further over the last month.      She has occasionally headaches, mostly mild to moderate and helped by Tylenol.  Only a few have nausea.   No associated neurologic symptoms or aura.    Last year she had a home sleep study.  By her report, it was normal..  Weight has not changed since that time.  DATA Personally reviewed:  MRI of the brain 10/27/2018 was personally reviewed and compared to a study 11/15/2007 and 02/25/2005.  There are multiple T2/flair hyperintense foci predominantly in the subcortical white matter.  Some IN the deep white matter and a couple are periventricular though none are in the callosal septal fibers.   Compared to MRIs from 11/15/2007 another MRI from 2007, there has been some progression over the last 10 years.  No foci are noted in the brainstem or cerebellum.  MRI of the cervical spie 11/15/2018 showed a normal spinal cord and no significant DJD  SSEP, VEP 07/05/2003 was  normal  Labs 06/10/2003 and 06/12/03:  Anti cardiolipin, Prot C, Prot S, Facotr V Lieden, TPA, AT III, Lyme, ANA, B12, ESR    EMG/NCV 10/17/2018 Impression: This NCV/EMG study shows the following: 1.   Minimal left ulnar neuropathy across the wrist. 2.   There is no definite evidence of radiculopathy though mild radiculopathies cannot always be detected by EMG.  Her grandfather had Wegener's disease and an MI.  Her maternal grandmother had a stroke  REVIEW OF SYSTEMS:  Constitutional: No fevers, chills, sweats, or change in appetite.  She has fatigue.  She has restless leg syndrome. Eyes: No visual changes, double vision, eye pain Ear, nose and throat: No hearing loss, ear pain, nasal congestion, sore throat Cardiovascular: No chest pain, palpitations Respiratory:  No shortness of breath at rest or with exertion.   No wheezes GastrointestinaI: No nausea, vomiting, diarrhea, abdominal pain, fecal incontinence Genitourinary:  No dysuria, urinary retention or frequency.  No nocturia. Musculoskeletal:  No neck pain, back pain Integumentary: No rash, pruritus, skin lesions Neurological: as above Psychiatric: No depression at this time.  No anxiety Endocrine: No palpitations, diaphoresis, change in appetite, change in weigh or increased thirst Hematologic/Lymphatic:  No anemia, purpura, petechiae. Allergic/Immunologic: No itchy/runny eyes, nasal congestion, recent allergic reactions, rashes  ALLERGIES: No Known Allergies  HOME MEDICATIONS:  Current Outpatient Medications:    amphetamine-dextroamphetamine (ADDERALL XR) 25 MG 24 hr capsule, TAKE 1 CAPSULE BY MOUTH EVERY MORNING., Disp: 30 capsule, Rfl: 0   busPIRone (BUSPAR) 15 MG tablet, Take 1 tablet (15 mg total) by mouth 2 (two) times daily., Disp: 180 tablet, Rfl: 0   LORazepam (ATIVAN) 0.5 MG tablet, Take 1 tablet (0.5 mg total) by mouth every 8 (eight) hours. (Patient taking differently: Take 0.5 mg by mouth every 8 (eight) hours as  needed.), Disp: 30 tablet, Rfl: 1   vortioxetine HBr (TRINTELLIX) 20 MG TABS tablet, Take 1 tablet (20 mg total) by mouth daily., Disp: 90 tablet, Rfl: 0   zolpidem (AMBIEN) 10 MG tablet, TAKE 1 TABLET BY MOUTH ONCE DAILY AT BEDTIME AS NEEDED FOR SLEEP, Disp: 90 tablet, Rfl: 1  PAST MEDICAL HISTORY: Past Medical History:  Diagnosis Date   Anxiety    Depression    Fibroid    Gastroschisis     PAST SURGICAL HISTORY: Past Surgical History:  Procedure Laterality Date   ABDOMINAL HYSTERECTOMY     supracervical hysterectomy   ABDOMINAL SURGERY     OOPHORECTOMY      FAMILY HISTORY: Family History  Problem Relation Age of Onset   Uterine cancer Mother    Diabetes Mother    Hypertension Mother    Thyroid disease Mother    Skin cancer Father    Subarachnoid hemorrhage Father    Diabetes Maternal Grandmother    Hypertension Maternal Grandmother    Diabetes Paternal Grandfather    Breast cancer Neg Hx     SOCIAL HISTORY:  Social History   Socioeconomic History   Marital status: Married    Spouse name: Not on file   Number of children: 1   Years of education: College   Highest education level: Not on file  Occupational History   Occupation: [employer]  Tobacco Use   Smoking status: Former    Types: Cigarettes    Quit date: 10/11/2001    Years since quitting: 19.5   Smokeless tobacco: Never  Substance and Sexual Activity   Alcohol use: Yes    Comment: occ   Drug use: No   Sexual activity: Yes    Partners: Female    Birth control/protection: Surgical    Comment: supracervical hysterectomy  Other Topics Concern   Not on file  Social History Narrative   Lives with husband and son   Caffeine use: rarely   Right handed    Social Determinants of Health   Financial Resource Strain: Not on file  Food Insecurity: Not on file  Transportation Needs: Not on file  Physical Activity: Not on  file  Stress: Not on file  Social Connections: Not on file  Intimate  Partner Violence: Not on file     PHYSICAL EXAM  Vitals:   04/29/21 0915  BP: 118/78  Pulse: (!) 106  Weight: 155 lb (70.3 kg)  Height: '5\' 5"'  (1.651 m)    Body mass index is 25.79 kg/m.   General: The patient is well-developed and well-nourished and in no acute distress  Skin: Extremities are without rash or edema  Neurologic Exam  Mental status: The patient is alert and oriented x 3 at the time of the examination. The patient has apparent normal recent and remote memory, with an apparently normal attention span and concentration ability.   Speech is normal.  Cranial nerves: Extraocular movements are full.  Normal facial strength.  The tongue is midline, and the patient has symmetric elevation of the soft palate. No obvious hearing deficits are noted.  Motor:  Muscle bulk is normal.   Tone is normal. Strength is  5 / 5 in all 4 extremities including the ulnar innervated hand muscles.  Sensory: She has a Tinel sign at the left elbow.  Today, sensation is symmetric.  Coordination: Finger-nose-finger and heel-to-shin was performed well.  Gait and station: Station is normal.   Gait and tandem gait are normal. Romberg is negative.   Reflexes: Deep tendon reflexes are symmetric and normal bilaterally.      DIAGNOSTIC DATA (LABS, IMAGING, TESTING) - I reviewed patient records, labs, notes, testing and imaging myself where available.  Lab Results  Component Value Date   WBC 5.4 04/16/2021   HGB 14.0 04/16/2021   HCT 40.6 04/16/2021   MCV 85 04/16/2021   PLT 293 04/16/2021      Component Value Date/Time   NA 141 04/16/2021 0817   K 4.0 04/16/2021 0817   CL 100 04/16/2021 0817   CO2 24 04/16/2021 0817   GLUCOSE 91 04/16/2021 0817   BUN 13 04/16/2021 0817   CREATININE 0.70 04/16/2021 0817   CALCIUM 9.1 04/16/2021 0817   PROT 6.3 04/16/2021 0817   ALBUMIN 4.5 04/16/2021 0817   AST 21 04/16/2021 0817   ALT 27 04/16/2021 0817   ALKPHOS 91 04/16/2021 0817   BILITOT  0.5 04/16/2021 0817   GFRNONAA 90 09/30/2020 1556   GFRAA 104 09/30/2020 1556   Lab Results  Component Value Date   CHOL 199 04/16/2021   HDL 49 04/16/2021   LDLCALC 128 (H) 04/16/2021   TRIG 125 04/16/2021   CHOLHDL 4.1 04/16/2021   No results found for: HGBA1C Lab Results  Component Value Date   QASTMHDQ22 297 04/16/2021   Lab Results  Component Value Date   TSH 1.680 04/16/2021       ASSESSMENT AND PLAN  White matter abnormality on MRI of brain - Plan: MR BRAIN W WO CONTRAST  Left arm weakness - Plan: MR BRAIN W WO CONTRAST  Left arm numbness - Plan: MR BRAIN W WO CONTRAST  Depression with anxiety  Other fatigue  Cognitive changes  1.  Unclear what is causing her symptoms.   She had similar symptoms in 2020 and earlier.   She does have multiple T2/flair hyperintense foci but they are small round and subcortical, more consistent with chronic microvascular ischemic change or sequela of migraine than to MS.  Additionally, there has been only minimal progression over many years which would be atypical for MS and more typical of SVID.  Additionally, the lumbar puncture x2 more than a decade  ago were negative for oligoclonal bands    MRI cervical spine in 2020 was normal.   2.    She fatigue and attention deficit.  Her mood seems to be doing better and she is sleeping well.  She is on Adderall with mild benefit. 3.   She is advised to stay active and exercise as tolerated.   4.    She will return to see me in 6 months or sooner if there are new or worsening neurologic symptoms.  Tai Skelly A. Felecia Shelling, MD, PhD, FAAN Certified in Neurology, Clinical Neurophysiology, Sleep Medicine, Pain Medicine and Neuroimaging Director, [place] [facility] [address] [place], [place] [address] [phone]

## 2021-05-05 NOTE — Progress Notes (Signed)
40 y.o. G61P1001 Married Caucasian female here for annual exam.    Denies hot flashes or night sweats.   Some vaginal dryness.  Uses water based lubricant.   Has elevated cholesterol but is not on medication. Lost 10 pounds through dietary change.   Received her Covid booster.   Is a nurse at [employer].  Son is 2 yo.   PCP:  Rochel Brome, MD   No LMP recorded. Patient has had a hysterectomy.           Sexually active: Yes.    The current method of family planning is status post hysterectomy--SUPRACERVICAL.    Exercising: No.  The patient does not participate in regular exercise at present. Smoker:  no  Health Maintenance: Pap: 11-18-17 Neg:Neg HR HPV History of abnormal Pap:  no MMG: 06-13-20 [facility]D/Neg/[facility] Colonoscopy:  n/a BMD:   n/a  Result  n/a TDaP:  2013 Gardasil:   no HIV: 09-30-20 NR Hep C:09-30-20 Neg Screening Labs:  PCP.   reports that she quit smoking about 19 years ago. Her smoking use included cigarettes. She has never used smokeless tobacco. She reports previous alcohol use. She reports that she does not use drugs.  Past Medical History:  Diagnosis Date   Anxiety    Depression    Fibroid    Gastroschisis     Past Surgical History:  Procedure Laterality Date   ABDOMINAL HYSTERECTOMY     supracervical hysterectomy   ABDOMINAL SURGERY     OOPHORECTOMY      Current Outpatient Medications  Medication Sig Dispense Refill   amphetamine-dextroamphetamine (ADDERALL XR) 25 MG 24 hr capsule TAKE 1 CAPSULE BY MOUTH EVERY MORNING. 30 capsule 0   busPIRone (BUSPAR) 15 MG tablet Take 1 tablet (15 mg total) by mouth 2 (two) times daily. 180 tablet 0   LORazepam (ATIVAN) 0.5 MG tablet Take 1 tablet (0.5 mg total) by mouth every 8 (eight) hours. (Patient taking differently: Take 0.5 mg by mouth every 8 (eight) hours as needed.) 30 tablet 1   vortioxetine HBr (TRINTELLIX) 20 MG TABS tablet Take 1 tablet (20 mg total) by mouth daily. 90  tablet 0   zolpidem (AMBIEN) 10 MG tablet TAKE 1 TABLET BY MOUTH ONCE DAILY AT BEDTIME AS NEEDED FOR SLEEP 90 tablet 1   No current facility-administered medications for this visit.    Family History  Problem Relation Age of Onset   Uterine cancer Mother    Diabetes Mother    Hypertension Mother    Thyroid disease Mother    Skin cancer Father    Subarachnoid hemorrhage Father    Diabetes Maternal Grandmother    Hypertension Maternal Grandmother    Diabetes Paternal Grandfather    Breast cancer Neg Hx     Review of Systems  All other systems reviewed and are negative.  Exam:   BP 126/80   Pulse 70   Ht 5' 4.5" (1.638 m)   Wt 155 lb (70.3 kg)   SpO2 99%   BMI 26.19 kg/m     General appearance: alert, cooperative and appears stated age Head: normocephalic, without obvious abnormality, atraumatic Neck: no adenopathy, supple, symmetrical, trachea midline and thyroid normal to inspection and palpation Lungs: clear to auscultation bilaterally Breasts: normal appearance, no masses or tenderness, No nipple retraction or dimpling, No nipple discharge or bleeding, No axillary adenopathy Heart: regular rate and rhythm Abdomen: soft, non-tender; no masses, no organomegaly Extremities: extremities normal, atraumatic, no cyanosis or  edema Skin: skin color, texture, turgor normal. No rashes or lesions.  Multiple pigmented nevi.  Lymph nodes: cervical, supraclavicular, and axillary nodes normal. Neurologic: grossly normal  Pelvic: External genitalia:  no lesions              No abnormal inguinal nodes palpated.              Urethra:  normal appearing urethra with no masses, tenderness or lesions              Bartholins and Skenes: normal                 Vagina: normal appearing vagina with normal color and discharge, no lesions              Cervix: no lesions              Pap taken: no. Bimanual Exam:  Uterus:  absent.              Adnexa: no mass, fullness, tenderness               Rectal exam: yes.  Confirms.              Anus:  normal sphincter tone, no lesions  Chaperone was present for exam:  Estill Bamberg, CMA  Assessment:   Well woman visit with gynecologic exam. Status post supracervical hysterectomy for fibroids.  Status post left oophorectomy and later right oophorectomy for pain. Her last remaining ovary was removed in 2014.  No ERT.  Surgical menopause in her 63s.  Vaginal atrophy.  FH uterine cancer in mother.  Anxiety and depression.  Pigmented nevi.   Plan: Mammogram screening discussed. Self breast awareness reviewed. Pap and HR HPV 2024.  New guidelines discussed.  Guidelines for Calcium, Vitamin D, regular exercise program including cardiovascular and weight bearing exercise. Consider bone density in 2024.  We discussed cooking oils, vit E, and local vaginal estrogen for atrophy.  She declines vaginal estrogen at this time.  I did discuss vaginal estrogen's potential effect on breast cancer.  I recommend she establish care with a dermatologist for routine skin checks.  Follow up annually and prn.   After visit summary provided.

## 2021-05-07 ENCOUNTER — Telehealth: Payer: Self-pay | Admitting: Neurology

## 2021-05-07 NOTE — Telephone Encounter (Signed)
Called patient's insurance [facility]) to obtain PA for MRI brain w/wo contrast. PA [id number] (05/07/21- 06/06/21). We will call patient to schedule at [facility].

## 2021-05-12 ENCOUNTER — Ambulatory Visit (INDEPENDENT_AMBULATORY_CARE_PROVIDER_SITE_OTHER): Payer: No Typology Code available for payment source | Admitting: Obstetrics and Gynecology

## 2021-05-12 ENCOUNTER — Other Ambulatory Visit: Payer: Self-pay

## 2021-05-12 ENCOUNTER — Ambulatory Visit: Payer: No Typology Code available for payment source

## 2021-05-12 ENCOUNTER — Encounter: Payer: Self-pay | Admitting: Obstetrics and Gynecology

## 2021-05-12 VITALS — BP 126/80 | HR 70 | Ht 64.5 in | Wt 155.0 lb

## 2021-05-12 DIAGNOSIS — Z01419 Encounter for gynecological examination (general) (routine) without abnormal findings: Secondary | ICD-10-CM | POA: Diagnosis not present

## 2021-05-12 DIAGNOSIS — R9082 White matter disease, unspecified: Secondary | ICD-10-CM | POA: Diagnosis not present

## 2021-05-12 DIAGNOSIS — R29898 Other symptoms and signs involving the musculoskeletal system: Secondary | ICD-10-CM

## 2021-05-12 DIAGNOSIS — R200 Anesthesia of skin: Secondary | ICD-10-CM

## 2021-05-12 MED ORDER — GADOBENATE DIMEGLUMINE 529 MG/ML IV SOLN
14.0000 mL | Freq: Once | INTRAVENOUS | Status: AC | PRN
  Administered 2021-05-12: 14 mL via INTRAVENOUS

## 2021-05-12 NOTE — Patient Instructions (Signed)

## 2021-05-13 ENCOUNTER — Other Ambulatory Visit: Payer: Self-pay

## 2021-05-13 ENCOUNTER — Other Ambulatory Visit (HOSPITAL_COMMUNITY): Payer: Self-pay

## 2021-05-13 DIAGNOSIS — F900 Attention-deficit hyperactivity disorder, predominantly inattentive type: Secondary | ICD-10-CM

## 2021-05-13 MED ORDER — AMPHETAMINE-DEXTROAMPHET ER 25 MG PO CP24
ORAL_CAPSULE | Freq: Every morning | ORAL | 0 refills | Status: DC
Start: 2021-05-13 — End: 2021-06-22
  Filled 2021-05-13: qty 30, 30d supply, fill #0

## 2021-05-19 ENCOUNTER — Encounter: Payer: No Typology Code available for payment source | Attending: Psychology | Admitting: Psychology

## 2021-05-19 ENCOUNTER — Encounter: Payer: Self-pay | Admitting: Psychology

## 2021-05-19 ENCOUNTER — Other Ambulatory Visit: Payer: Self-pay

## 2021-05-19 DIAGNOSIS — R4189 Other symptoms and signs involving cognitive functions and awareness: Secondary | ICD-10-CM | POA: Diagnosis present

## 2021-05-19 DIAGNOSIS — R41840 Attention and concentration deficit: Secondary | ICD-10-CM | POA: Insufficient documentation

## 2021-05-19 DIAGNOSIS — R9082 White matter disease, unspecified: Secondary | ICD-10-CM | POA: Insufficient documentation

## 2021-05-19 DIAGNOSIS — F418 Other specified anxiety disorders: Secondary | ICD-10-CM | POA: Diagnosis present

## 2021-05-19 NOTE — Progress Notes (Signed)
Neuropsychological Consultation   Patient:   Sheri Moore   DOB:   08-10-1981  MR Number:  [id number]  Location:  [facility] [place], [place] [id number] [address] Dept: [phone]           Date of Service:   05/19/2021  Start Time:   2 PM End Time:   4 PM  Today's visit was an in person visit that was conducted in my outpatient clinic office with the patient myself present.  1 hour and 15 minutes was spent in formal face-to-face clinical interview and the other 45 minutes were spent in records review and report writing.  Provider/Observer:  Ilean Skill, Psy.D.       Clinical Neuropsychologist       Billing Code/Service: [id number]  Chief Complaint:    Sheri Moore is a 40 year old female referred for neuropsychological consultation by Rochel Brome, MD who is the patient's PCP.  The patient is also been followed by Dr. Felecia Shelling who is her treating neurologist due to these neurobehavioral issues secondary to white matter and subcortical changes.  The patient has been diagnosed with white matter changes/disease of unknown etiological factors although MS has been considered and potentially ruled out and currently is viewed is most consistent with small vessel disease/microvascular ischemic changes.  The patient is describing significant difficulties with retrieval type memory deficits, problems with attention and concentration, slowed information processing, difficulties with problem-solving executive functioning and vertigo.  Patient also has had some emotional features including depressive and anxiety based symptomatology.  Reason for Service:  Sheri Moore is a 40 year old female referred for neuropsychological consultation by Rochel Brome, MD who is the patient's PCP.  The patient is also been followed by Dr. Felecia Shelling who is her treating neurologist due to these  neurobehavioral issues secondary to white matter and subcortical changes.  The patient has been diagnosed with white matter changes/disease of unknown etiological factors although MS has been considered and potentially ruled out and currently is viewed is most consistent with small vessel disease/microvascular ischemic changes.  The patient is describing significant difficulties with retrieval type memory deficits, problems with attention and concentration, slowed information processing, difficulties with problem-solving executive functioning and vertigo.  Patient also has had some emotional features including depressive and anxiety based symptomatology.  The patient has been followed by Arlice Colt, MD with [facility] since at least 2020.  Initially, the patient began experiencing vertigo and headaches around 2004.  Brain MRI performed showed white matter abnormalities and the patient was followed initially by Dr. Jannifer Franklin for neurological consultation.  LP was performed and cerebrospinal fluid was interpreted as normal in 2004.  She did have some difficulties with her spinal tap.  The patient was followed by Dr. Dellis Filbert with [facility] between 2006 in 2009 and had a couple of MRI scans performed.  Besides the vertigo patient also reported and continues to report cognitive changes including reduced focus and attention and concentration difficulties with consideration of MS as the etiological factors.  In 2019 she had a reoccurrence of symptoms including the onset of lip tingling bilaterally as well as sensation changes on her face and left arm and hand tingling.  This sensation was intermittent.  Patient is also been describing for the past couple years her arms feeling very heavy without tingling.  Fatigue is persisted for many years and is exacerbated by heat and will  feel too tired to do other activities when she gets fatigued.  The patient describes adequate sleep patterns but utilizes  Ambien to be able to sleep.  She has been using Ambien for several years now.  She has been tried on various psychotropic medications including Trintellix and Cymbalta.  Patient has described occasional headaches that were mostly mild to moderate and helped by Tylenol.  She denies them typically associated with nausea, photophobia or phonophobia and it is unclear whether they are migrainous in nature.  Patient has had a normal sleep study in the past.  More recently, the patient has reported increased feelings of heaviness in her arms and occasional tingling in her left arm and third and fourth fingers.  Gait and balance are described as being fine.  These are similar to symptoms she experienced in 2020 that had improved before reoccurring.  Patient continues to describe cognitive difficulties and has been tried on various stimulant type medications and is currently taking Adderall.  The patient has had multiple MRIs and while initial reports stated probable MS Dr. Felecia Shelling feels like the minimal changes over the past 15 years and the images are more consistent with chronic microvascular ischemic changes rather than MS.  Recent labs conducted in July of this year including ANA, B12, vitamin D, TSH, CMP, CBC all were normal and felt to be noncontributory.  During the clinical interview the patient describes her typical headaches and vertigo but denies any photophobia or phonophobia and rare nausea symptoms.  She describes any visual aura type symptoms or other cognitive changes that immediately preceded her headaches.  She does report that during a bad headache that she experiences increase in her cognitive difficulties.  The patient describes her memory issues to include situations like clicking on her browser to go to website and then not remembering why she was going online to look something up.  The patient reports that she has to keep her medications in a pillbox because she will take her medications and then  remember going to take her medications again and not remembering she had already taken them or not.  Patient will forget whether or not she locked her doors and have to go back and look.  Patient reports that she will take clothes out of the dryer knowing that she has to put more close into the dryer from the washer and then leave to fold her close and forget to come back to [place] in the dryer.  The patient reports that her husband or son will ask her simple questions and she has very delayed response and that she has to think about it.  Patient has difficulty keeping up when she is cooking food and times where she will go to work and have no memory of whether or not she took her son to school or other activities going to work.  The patient denies any history of concussive events or motor vehicle accidents and no loss of consciousness in the past.  There is no family history of neurological conditions except her father recently having a subarachnoid hemorrhage in the past year.  He has recovered well.  The patient reports that she has to take Ambien at night to sleep and will take her Ambien around 9 or 930 and is in bed by 10 or 1030 and wakes up at 6:45 AM.  She describes her appetite is good and has 3 regular meals each day.  The patient reports that the primary reason for this referral  was to see if there was something she could do to help with her symptoms as they are not being able to find particular etiological [place] and other than the Adderall during the day and Ambien at night she is not having any other specific interventions to help.  Behavioral Observation: Sheri Moore  presents as a 40 y.o.-year-old Right handed Caucasian Female who appeared her stated age. her dress was Appropriate and she was Well Groomed and her manners were Appropriate to the situation.  her participation was indicative of Appropriate behaviors.  There were physical disabilities noted.  she displayed an appropriate  level of cooperation and motivation.     Interactions:    Active Appropriate  Attention:   abnormal and attention span appeared shorter than expected for age  Memory:   abnormal; remote memory intact, recent memory impaired  Visuo-spatial:  not examined  Speech (Volume):  normal  Speech:   normal; normal  Thought Process:  Coherent and Relevant  Though Content:  WNL; not suicidal and not homicidal  Orientation:   person, place, time/date, and situation  Judgment:   Good  Planning:   Fair  Affect:    Anxious  Mood:    Dysphoric  Insight:   Good  Intelligence:   normal  Marital Status/Living: The patient was born and raised in [place] in [place] along with 1 sister.  She is married and has been married for 72 years and they have a 76 year old son.  There are no major stressors or significant past medical history other than what is going on with her changes on MRI.  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  She has been working as a Marine scientist for the past 13 years.  Hobbies and interest include camping, spending time with her pets and family and baseball.  Substance Use:  No concerns of substance abuse are reported.  Patient denies any substance use or abuse.  Education:   College  Medical History:   Past Medical History:  Diagnosis Date   Anxiety    Depression    Fibroid    Gastroschisis          Patient Active Problem List   Diagnosis Date Noted   Cognitive changes 04/29/2021   Attention deficit disorder (ADD) without hyperactivity 01/09/2020   GAD (generalized anxiety disorder) 01/09/2020   Somnolence, daytime 01/09/2020   Facial numbness 12/12/2018   White matter abnormality on MRI of brain 11/09/2018   Other fatigue 11/09/2018   Depression with anxiety 11/09/2018   Left arm  numbness 10/17/2018   Left arm weakness 10/17/2018   Sprain of anterior talofibular ligament of right ankle 05/08/2017       Psychiatric History:  No prior psychiatric history other than some issues with anxiety.  Family Med/Psych History:  Family History  Problem Relation Age of Onset   Uterine cancer Mother    Diabetes Mother    Hypertension Mother    Thyroid disease Mother    Skin cancer Father    Subarachnoid hemorrhage Father    Diabetes Maternal Grandmother    Hypertension Maternal Grandmother    Diabetes Paternal Grandfather    Breast cancer Neg Hx     Impression/DX:  Sheri Moore is a 40 year old female referred for neuropsychological consultation by Rochel Brome, MD who  is the patient's PCP.  The patient is also been followed by Dr. Felecia Shelling who is her treating neurologist due to these neurobehavioral issues secondary to white matter and subcortical changes.  The patient has been diagnosed with white matter changes/disease of unknown etiological factors although MS has been considered and potentially ruled out and currently is viewed is most consistent with small vessel disease/microvascular ischemic changes.  The patient is describing significant difficulties with retrieval type memory deficits, problems with attention and concentration, slowed information processing, difficulties with problem-solving executive functioning and vertigo.  Patient also has had some emotional features including depressive and anxiety based symptomatology.  Disposition/Plan:  At this point, I do not foresee a need for any formal neuropsychological testing as the patient's symptoms are very consistent with findings on the MRI related to white matter changes.  Retrieval issues with memory, word finding difficulties, attention and concentration and distractibility are all consistent with what we would expect with the degree of white matter changes noted.  The patient does report that she has times of  exacerbation.  The patient would like to work on therapeutic issues particular around coping with the changes that she experiences and the anxiety associated with these findings.  We have set the patient up for individual therapeutic interventions looking at the underlying neurological issues and coping and adjustment strategies.  We will need to look at sleep issues as the patient has been taking Ambien for some time and look at ways of trying to achieve better sleep without Ambien as that has been shown to cause some memory and attention issues at times.  Diagnosis:    White matter abnormality on MRI of brain  Cognitive changes  Attention and concentration deficit  Depression with anxiety         Electronically Signed   _______________________ Ilean Skill, Psy.D. Clinical Neuropsychologist

## 2021-05-27 ENCOUNTER — Other Ambulatory Visit: Payer: Self-pay | Admitting: Family Medicine

## 2021-05-27 DIAGNOSIS — Z1231 Encounter for screening mammogram for malignant neoplasm of breast: Secondary | ICD-10-CM

## 2021-06-02 ENCOUNTER — Ambulatory Visit: Payer: No Typology Code available for payment source | Admitting: Psychology

## 2021-06-22 ENCOUNTER — Other Ambulatory Visit: Payer: Self-pay | Admitting: Nurse Practitioner

## 2021-06-22 ENCOUNTER — Other Ambulatory Visit (HOSPITAL_COMMUNITY): Payer: Self-pay

## 2021-06-22 DIAGNOSIS — F900 Attention-deficit hyperactivity disorder, predominantly inattentive type: Secondary | ICD-10-CM

## 2021-06-22 MED ORDER — AMPHETAMINE-DEXTROAMPHET ER 25 MG PO CP24
ORAL_CAPSULE | Freq: Every morning | ORAL | 0 refills | Status: DC
  Filled 2021-06-22: qty 30, 30d supply, fill #0

## 2021-06-22 MED ORDER — VORTIOXETINE HBR 20 MG PO TABS
20.0000 mg | ORAL_TABLET | Freq: Every day | ORAL | 0 refills | Status: DC
  Filled 2021-06-22: qty 90, 90d supply, fill #0

## 2021-07-06 ENCOUNTER — Other Ambulatory Visit: Payer: Self-pay

## 2021-07-06 ENCOUNTER — Ambulatory Visit
Admission: RE | Admit: 2021-07-06 | Discharge: 2021-07-06 | Disposition: A | Discharge status: Home / Self Care | Payer: No Typology Code available for payment source | Source: Ambulatory Visit | Attending: Family Medicine | Admitting: Family Medicine

## 2021-07-06 DIAGNOSIS — Z1231 Encounter for screening mammogram for malignant neoplasm of breast: Secondary | ICD-10-CM

## 2021-07-07 ENCOUNTER — Other Ambulatory Visit (HOSPITAL_COMMUNITY): Payer: Self-pay

## 2021-07-07 ENCOUNTER — Other Ambulatory Visit: Payer: Self-pay

## 2021-07-07 MED ORDER — LORAZEPAM 0.5 MG PO TABS
0.5000 mg | ORAL_TABLET | Freq: Three times a day (TID) | ORAL | 1 refills | Status: DC
  Filled 2021-07-07: qty 30, 10d supply, fill #0

## 2021-07-13 ENCOUNTER — Other Ambulatory Visit: Payer: Self-pay | Admitting: Family Medicine

## 2021-07-13 DIAGNOSIS — N6310 Unspecified lump in the right breast, unspecified quadrant: Secondary | ICD-10-CM

## 2021-07-13 DIAGNOSIS — R928 Other abnormal and inconclusive findings on diagnostic imaging of breast: Secondary | ICD-10-CM

## 2021-07-14 ENCOUNTER — Other Ambulatory Visit (HOSPITAL_COMMUNITY): Payer: Self-pay

## 2021-07-14 ENCOUNTER — Other Ambulatory Visit: Payer: Self-pay | Admitting: Family Medicine

## 2021-07-14 MED ORDER — ZOLPIDEM TARTRATE 10 MG PO TABS
ORAL_TABLET | ORAL | 1 refills | Status: DC
Start: 2021-07-14 — End: 2022-01-24
  Filled 2021-07-14: qty 90, 90d supply, fill #0
  Filled 2021-10-21: qty 90, 90d supply, fill #1

## 2021-07-29 ENCOUNTER — Other Ambulatory Visit: Payer: Self-pay

## 2021-07-29 ENCOUNTER — Ambulatory Visit
Admission: RE | Admit: 2021-07-29 | Discharge: 2021-07-29 | Disposition: A | Discharge status: Home / Self Care | Payer: No Typology Code available for payment source | Source: Ambulatory Visit | Attending: Family Medicine | Admitting: Family Medicine

## 2021-07-29 ENCOUNTER — Other Ambulatory Visit: Payer: Self-pay | Admitting: Family Medicine

## 2021-07-29 DIAGNOSIS — R928 Other abnormal and inconclusive findings on diagnostic imaging of breast: Secondary | ICD-10-CM

## 2021-07-30 ENCOUNTER — Ambulatory Visit: Payer: No Typology Code available for payment source | Admitting: Family Medicine

## 2021-08-04 ENCOUNTER — Ambulatory Visit
Admission: RE | Admit: 2021-08-04 | Discharge: 2021-08-04 | Disposition: A | Discharge status: Home / Self Care | Payer: No Typology Code available for payment source | Source: Ambulatory Visit | Attending: Family Medicine | Admitting: Family Medicine

## 2021-08-04 ENCOUNTER — Other Ambulatory Visit: Payer: Self-pay

## 2021-08-04 DIAGNOSIS — R928 Other abnormal and inconclusive findings on diagnostic imaging of breast: Secondary | ICD-10-CM

## 2021-08-05 ENCOUNTER — Other Ambulatory Visit: Payer: Self-pay | Admitting: Family Medicine

## 2021-08-05 ENCOUNTER — Ambulatory Visit (INDEPENDENT_AMBULATORY_CARE_PROVIDER_SITE_OTHER): Payer: No Typology Code available for payment source

## 2021-08-05 DIAGNOSIS — Z23 Encounter for immunization: Secondary | ICD-10-CM | POA: Diagnosis not present

## 2021-08-05 DIAGNOSIS — C50411 Malignant neoplasm of upper-outer quadrant of right female breast: Secondary | ICD-10-CM

## 2021-08-06 ENCOUNTER — Other Ambulatory Visit: Payer: Self-pay | Admitting: Family Medicine

## 2021-08-10 ENCOUNTER — Other Ambulatory Visit (HOSPITAL_COMMUNITY): Payer: Self-pay

## 2021-08-10 ENCOUNTER — Other Ambulatory Visit: Payer: Self-pay

## 2021-08-10 DIAGNOSIS — F900 Attention-deficit hyperactivity disorder, predominantly inattentive type: Secondary | ICD-10-CM

## 2021-08-10 MED ORDER — AMPHETAMINE-DEXTROAMPHET ER 25 MG PO CP24
ORAL_CAPSULE | Freq: Every morning | ORAL | 0 refills | Status: DC
  Filled 2021-08-10: qty 30, 30d supply, fill #0

## 2021-08-11 ENCOUNTER — Other Ambulatory Visit (HOSPITAL_COMMUNITY): Payer: Self-pay

## 2021-08-17 ENCOUNTER — Other Ambulatory Visit: Payer: Self-pay | Admitting: General Surgery

## 2021-08-17 DIAGNOSIS — Z170 Estrogen receptor positive status [ER+]: Secondary | ICD-10-CM | POA: Insufficient documentation

## 2021-08-17 DIAGNOSIS — C50411 Malignant neoplasm of upper-outer quadrant of right female breast: Secondary | ICD-10-CM | POA: Insufficient documentation

## 2021-08-18 ENCOUNTER — Other Ambulatory Visit: Payer: Self-pay | Admitting: Genetic Counselor

## 2021-08-18 DIAGNOSIS — C50919 Malignant neoplasm of unspecified site of unspecified female breast: Secondary | ICD-10-CM

## 2021-08-19 ENCOUNTER — Inpatient Hospital Stay: Payer: No Typology Code available for payment source

## 2021-08-19 ENCOUNTER — Other Ambulatory Visit: Payer: Self-pay

## 2021-08-19 ENCOUNTER — Encounter: Payer: Self-pay | Admitting: Genetic Counselor

## 2021-08-19 ENCOUNTER — Inpatient Hospital Stay: Payer: No Typology Code available for payment source | Attending: Genetic Counselor | Admitting: Genetic Counselor

## 2021-08-19 DIAGNOSIS — Z809 Family history of malignant neoplasm, unspecified: Secondary | ICD-10-CM

## 2021-08-19 DIAGNOSIS — C50919 Malignant neoplasm of unspecified site of unspecified female breast: Secondary | ICD-10-CM

## 2021-08-19 DIAGNOSIS — C50911 Malignant neoplasm of unspecified site of right female breast: Secondary | ICD-10-CM | POA: Diagnosis not present

## 2021-08-19 DIAGNOSIS — Z8049 Family history of malignant neoplasm of other genital organs: Secondary | ICD-10-CM

## 2021-08-19 DIAGNOSIS — Z808 Family history of malignant neoplasm of other organs or systems: Secondary | ICD-10-CM

## 2021-08-19 DIAGNOSIS — Z803 Family history of malignant neoplasm of breast: Secondary | ICD-10-CM | POA: Diagnosis not present

## 2021-08-19 LAB — GENETIC SCREENING ORDER

## 2021-08-19 NOTE — Progress Notes (Signed)
REFERRING PROVIDER: Stark Klein, MD [address] [place],  [place] [address]  PRIMARY PROVIDER:  Rochel Brome, MD  PRIMARY REASON FOR VISIT:  1. Family history of breast cancer   2. Family history of melanoma   3. Family history of uterine cancer      HISTORY OF PRESENT ILLNESS:   Sheri Moore, a 40 y.o. female, was seen for a [place] cancer genetics consultation at the request of Dr. Barry Dienes due to a personal and family history of breast cancer and family history of uterine cancer and melanoma.  Sheri Moore presents to clinic today to discuss the possibility of a hereditary predisposition to cancer, genetic testing, and to further clarify her future cancer risks, as well as potential cancer risks for family members.   In November 2022, at the age of 20, Sheri Moore was diagnosed with ductal carcinoma of the right breast. The treatment plan has not been fully determined, but she will have radiation in [place].      CANCER HISTORY:  Oncology History   No history exists.     RISK FACTORS:  Menarche was at age 28.  First live birth at age 78.  OCP use for approximately  5+ years  .  Ovaries intact: no.  Hysterectomy: yes.  Menopausal status: postmenopausal.  HRT use: 0 years. Colonoscopy: yes; normal. Mammogram within the last year: yes. Number of breast biopsies: 1. Up to date with pelvic exams: yes. Any excessive radiation exposure in the past: no  Past Medical History:  Diagnosis Date   Anxiety    Depression    Family history of breast cancer    Family history of melanoma    Family history of uterine cancer    Fibroid    Gastroschisis     Past Surgical History:  Procedure Laterality Date   ABDOMINAL HYSTERECTOMY     supracervical hysterectomy   ABDOMINAL SURGERY     OOPHORECTOMY      Social History   Socioeconomic History   Marital status: Married    Spouse name: Not on file   Number of children: 1   Years of education: College   Highest  education level: Not on file  Occupational History   Occupation: [employer]  Tobacco Use   Smoking status: Former    Types: Cigarettes    Quit date: 10/11/2001    Years since quitting: 19.8   Smokeless tobacco: Never  Vaping Use   Vaping Use: Never used  Substance and Sexual Activity   Alcohol use: Not Currently    Comment: occ   Drug use: No   Sexual activity: Yes    Partners: Female    Birth control/protection: Surgical    Comment: supracervical hysterectomy  Other Topics Concern   Not on file  Social History Narrative   Lives with husband and son   Caffeine use: rarely   Right handed    Social Determinants of Health   Financial Resource Strain: Not on file  Food Insecurity: Not on file  Transportation Needs: Not on file  Physical Activity: Not on file  Stress: Not on file  Social Connections: Not on file     FAMILY HISTORY:  We obtained a detailed, 4-generation family history.  Significant diagnoses are listed below: Family History  Problem Relation Age of Onset   Uterine cancer Mother 81   Diabetes Mother    Hypertension Mother    Thyroid disease Mother    Subarachnoid hemorrhage Father  Melanoma Father 97   Diabetes Maternal Grandmother    Hypertension Maternal Grandmother    Melanoma Paternal Grandfather 54   Diabetes Paternal Grandfather    Breast cancer Other 64       MGMs sister   Breast cancer Other 25       MGMs sister   Breast cancer Other 108       MGF's sister   Breast cancer Other        dx < 24; PGMs sister     The patient has one son who is cancer free.  She has a sister who is cancer free.  Both parents are living.  The patient's mother was diagnosed with uterine cancer at 13.  She had a brother and four sisters, who are all cancer free.  Both of her parents are deceased from non cancer related issues.  However her mother had two sisters with breast cancer in their 60's and her father had a sister with breast cancer at  49.  The patient's father was diagnosed with melanoma in his 85's.  He had one brother who is cancer free.  His parents are living.  His mother had a sister who died young with breast cancer.  His father had melanoma in his 42's.  Sheri Moore is unaware of previous family history of genetic testing for hereditary cancer risks. Patient's maternal ancestors are of Caucasian descent, and paternal ancestors are of Caucasian descent. There is no reported Ashkenazi Jewish ancestry. There is no known consanguinity.  GENETIC COUNSELING ASSESSMENT: Sheri Moore is a 40 y.o. female with a personal and family history of cancer which is somewhat suggestive of a hereditary cancer syndrome and predisposition to cancer given the combination of cancer and young ages of onset. We, therefore, discussed and recommended the following at today's visit.   DISCUSSION: We discussed that, in general, most cancer is not inherited in families, but instead is sporadic or familial. Sporadic cancers occur by chance and typically happen at older ages (>50 years) as this type of cancer is caused by genetic changes acquired during an individual's lifetime. Some families have more cancers than would be expected by chance; however, the ages or types of cancer are not consistent with a known genetic mutation or known genetic mutations have been ruled out. This type of familial cancer is thought to be due to a combination of multiple genetic, environmental, hormonal, and lifestyle factors. While this combination of factors likely increases the risk of cancer, the exact source of this risk is not currently identifiable or testable.  We discussed that 5 - 10% of breast cancer is hereditary, with most cases associated with BRCA mutations.  There are other genes that can be associated with hereditary breast cancer syndromes.  These include ATM, CHEK2 and PALB2.  We discussed that testing is beneficial for several reasons including knowing how to  follow individuals after completing their treatment, identifying whether potential treatment options such as PARP inhibitors would be beneficial, and understand if other family members could be at risk for cancer and allow them to undergo genetic testing.   We reviewed the characteristics, features and inheritance patterns of hereditary cancer syndromes. We also discussed genetic testing, including the appropriate family members to test, the process of testing, insurance coverage and turn-around-time for results. We discussed the implications of a negative, positive and/or variant of uncertain significant result. In order to get genetic test results in a timely manner so that Sheri Moore can use these  genetic test results for surgical decisions, we recommended Sheri Moore pursue genetic testing for the BRCAPlus. Once complete, we recommend Sheri Moore pursue reflex genetic testing to the CancerNext-Expanded+RNAinsight gene panel.   The CancerNext-Expanded gene panel offered by [facility] and includes sequencing and rearrangement analysis for the following 77 genes: AIP, ALK, APC*, ATM*, AXIN2, BAP1, BARD1, BLM, BMPR1A, BRCA1*, BRCA2*, BRIP1*, CDC73, CDH1*, CDK4, CDKN1B, CDKN2A, CHEK2*, CTNNA1, DICER1, FANCC, FH, FLCN, GALNT12, KIF1B, LZTR1, MAX, MEN1, MET, MLH1*, MSH2*, MSH3, MSH6*, MUTYH*, NBN, NF1*, NF2, NTHL1, PALB2*, PHOX2B, PMS2*, POT1, PRKAR1A, PTCH1, PTEN*, RAD51C*, RAD51D*, RB1, RECQL, RET, SDHA, SDHAF2, SDHB, SDHC, SDHD, SMAD4, SMARCA4, SMARCB1, SMARCE1, STK11, SUFU, TMEM127, TP53*, TSC1, TSC2, VHL and XRCC2 (sequencing and deletion/duplication); EGFR, EGLN1, HOXB13, KIT, MITF, PDGFRA, POLD1, and POLE (sequencing only); EPCAM and GREM1 (deletion/duplication only). DNA and RNA analyses performed for * genes.   Based on Sheri Moore's personal and family history of cancer, she meets medical criteria for genetic testing. Despite that she meets criteria, she may still have an out of pocket cost.   PLAN:  After considering the risks, benefits, and limitations, Sheri Moore provided informed consent to pursue genetic testing and the blood sample was sent to [facility] for analysis of the BRCAPlus and CancerNext-Expanded+RNAinsight. Results should be available within approximately 7-10 days weeks' time, at which point they will be disclosed by telephone to Sheri Moore, as will any additional recommendations warranted by these results. Sheri Moore will receive a summary of her genetic counseling visit and a copy of her results once available. This information will also be available in Epic.   Lastly, we encouraged Sheri Moore to remain in contact with cancer genetics annually so that we can continuously update the family history and inform her of any changes in cancer genetics and testing that may be of benefit for this family.   Sheri Moore questions were answered to her satisfaction today. Our contact information was provided should additional questions or concerns arise. Thank you for the referral and allowing [place] to share in the care of your patient.   Rajni Holsworth P. Florene Glen, [place], [facility] Licensed, Insurance risk surveyor [email or web address] phone: [phone]  The patient was seen for a total of 35 minutes in face-to-face genetic counseling.  The patient was seen alone.  This patient was discussed with Drs. Magrinat, Lindi Adie and/or Burr Medico who agrees with the above.    _______________________________________________________________________ For Office Staff:  Number of people involved in session: 1 Was an Intern/ student involved with case: no

## 2021-08-21 ENCOUNTER — Other Ambulatory Visit: Payer: Self-pay | Admitting: General Surgery

## 2021-08-24 ENCOUNTER — Telehealth: Payer: Self-pay | Admitting: Hematology

## 2021-08-24 NOTE — Telephone Encounter (Signed)
Scheduled appt per 11/14 referral. Pt is aware of appt date and time.  

## 2021-08-25 ENCOUNTER — Other Ambulatory Visit: Payer: Self-pay | Admitting: *Deleted

## 2021-08-25 ENCOUNTER — Other Ambulatory Visit: Payer: No Typology Code available for payment source

## 2021-08-25 DIAGNOSIS — C50919 Malignant neoplasm of unspecified site of unspecified female breast: Secondary | ICD-10-CM

## 2021-08-27 ENCOUNTER — Ambulatory Visit (INDEPENDENT_AMBULATORY_CARE_PROVIDER_SITE_OTHER): Payer: No Typology Code available for payment source | Admitting: Nurse Practitioner

## 2021-08-27 ENCOUNTER — Encounter: Payer: Self-pay | Admitting: Nurse Practitioner

## 2021-08-27 VITALS — BP 110/80 | HR 103 | Temp 97.6°F | Resp 16 | Ht 64.5 in | Wt 154.0 lb

## 2021-08-27 DIAGNOSIS — H6982 Other specified disorders of Eustachian tube, left ear: Secondary | ICD-10-CM | POA: Diagnosis not present

## 2021-08-27 DIAGNOSIS — H6503 Acute serous otitis media, bilateral: Secondary | ICD-10-CM | POA: Diagnosis not present

## 2021-08-27 MED ORDER — AZITHROMYCIN 250 MG PO TABS: ORAL_TABLET | ORAL | 0 refills | Status: DC

## 2021-08-27 MED ORDER — TRIAMCINOLONE ACETONIDE 40 MG/ML IJ SUSP
60.0000 mg | Freq: Once | INTRAMUSCULAR | Status: AC
  Administered 2021-08-27: 14:00:00 60 mg via INTRAMUSCULAR

## 2021-08-27 NOTE — Progress Notes (Signed)
Acute Office Visit  Subjective:    Patient ID: Sheri Moore, female    DOB: [birth year], 40 y.o.   MRN: [id number]  Chief Complaint  Patient presents with  . Cough  . Ear Fullness    HPI: Patient is in today for coughing, and ears fullness since 4 days ago. She denies fever, body aches, chills. She has chronic allergic rhinitis that she is treating with Flonase nasal spray and OTC antihistamine.   Past Medical History:  Diagnosis Date  . Anxiety   . Depression   . Family history of breast cancer   . Family history of melanoma   . Family history of uterine cancer   . Fibroid   . Gastroschisis     Past Surgical History:  Procedure Laterality Date  . ABDOMINAL HYSTERECTOMY     supracervical hysterectomy  . ABDOMINAL SURGERY    . OOPHORECTOMY      Family History  Problem Relation Age of Onset  . Uterine cancer Mother 75  . Diabetes Mother   . Hypertension Mother   . Thyroid disease Mother   . Subarachnoid hemorrhage Father   . Melanoma Father 69  . Diabetes Maternal Grandmother   . Hypertension Maternal Grandmother   . Melanoma Paternal Grandfather 23  . Diabetes Paternal Grandfather   . Breast cancer Other 23       MGMs sister  . Breast cancer Other 62       MGMs sister  . Breast cancer Other 44       MGF's sister  . Breast cancer Other        dx < 86; PGMs sister    Social History   Socioeconomic History  . Marital status: Married    Spouse name: Not on file  . Number of children: 1  . Years of education: College  . Highest education level: Not on file  Occupational History  . Occupation: [employer]  Tobacco Use  . Smoking status: Former    Types: Cigarettes    Quit date: 10/11/2001    Years since quitting: 19.8  . Smokeless tobacco: Never  Vaping Use  . Vaping Use: Never used  Substance and Sexual Activity  . Alcohol use: Not Currently    Comment: occ  . Drug use: No  . Sexual activity: Yes    Partners: Female    Birth  control/protection: Surgical    Comment: supracervical hysterectomy  Other Topics Concern  . Not on file  Social History Narrative   Lives with husband and son   Caffeine use: rarely   Right handed    Social Determinants of Health   Financial Resource Strain: Not on file  Food Insecurity: Not on file  Transportation Needs: Not on file  Physical Activity: Not on file  Stress: Not on file  Social Connections: Not on file  Intimate Partner Violence: Not on file    Outpatient Medications Prior to Visit  Medication Sig Dispense Refill  . amphetamine-dextroamphetamine (ADDERALL XR) 25 MG 24 hr capsule TAKE 1 CAPSULE BY MOUTH EVERY MORNING. 30 capsule 0  . busPIRone (BUSPAR) 15 MG tablet Take 1 tablet (15 mg total) by mouth 2 (two) times daily. 180 tablet 0  . LORazepam (ATIVAN) 0.5 MG tablet Take 1 tablet (0.5 mg total) by mouth every 8 (eight) hours. 30 tablet 1  . vortioxetine HBr (TRINTELLIX) 20 MG TABS tablet Take 1 tablet by mouth daily. 90 tablet 0  . zolpidem (AMBIEN) 10 MG  tablet TAKE 1 TABLET BY MOUTH ONCE DAILY AT BEDTIME AS NEEDED FOR SLEEP 90 tablet 1   No facility-administered medications prior to visit.    No Known Allergies  Review of Systems  Constitutional:  Negative for chills, fatigue and fever.  HENT:  Positive for congestion, ear pain (left ear fullness and popping), postnasal drip and rhinorrhea. Negative for sinus pain and sore throat.   Eyes: Negative.   Respiratory:  Positive for cough. Negative for shortness of breath.   Cardiovascular:  Negative for chest pain.  Gastrointestinal:  Negative for abdominal distention, abdominal pain, diarrhea, nausea and vomiting.  Endocrine: Negative.   Genitourinary: Negative.   Musculoskeletal:  Negative for myalgias.  Skin: Negative.   Allergic/Immunologic: Positive for environmental allergies.  Neurological:  Negative for headaches.  Hematological: Negative.   Psychiatric/Behavioral: Negative.        Objective:     Physical Exam Vitals reviewed.  Constitutional:      Appearance: Normal appearance.  HENT:     Head: Normocephalic.     Right Ear: Tympanic membrane normal.     Left Ear: Tenderness present. A middle ear effusion is present. Tympanic membrane is injected and erythematous.     Nose: Nose normal.     Mouth/Throat:     Mouth: Mucous membranes are moist.  Eyes:     Pupils: Pupils are equal, round, and reactive to light.  Cardiovascular:     Rate and Rhythm: Regular rhythm. Tachycardia present.     Pulses: Normal pulses.     Heart sounds: Normal heart sounds.  Pulmonary:     Effort: Pulmonary effort is normal.     Breath sounds: Normal breath sounds.  Abdominal:     General: Bowel sounds are normal.     Palpations: Abdomen is soft.  Skin:    General: Skin is warm and dry.     Capillary Refill: Capillary refill takes less than 2 seconds.  Neurological:     General: No focal deficit present.     Mental Status: She is alert and oriented to person, place, and time.  Psychiatric:        Mood and Affect: Mood normal.        Behavior: Behavior normal.    BP 110/80   Pulse (!) 103   Temp 97.6 F (36.4 C)   Resp 16   Ht 5' 4.5" (1.638 m)   Wt 154 lb (69.9 kg)   SpO2 98%   BMI 26.03 kg/m  Wt Readings from Last 3 Encounters:  08/27/21 154 lb (69.9 kg)  05/12/21 155 lb (70.3 kg)  04/29/21 155 lb (70.3 kg)    Health Maintenance Due  Topic Date Due  . Pneumococcal Vaccine 57-91 Years old (1 - PCV) Never done  . PAP SMEAR-Modifier  11/18/2020  . COVID-19 Vaccine (3 - Moderna risk series) 03/09/2021    There are no preventive care reminders to display for this patient.   Lab Results  Component Value Date   TSH 1.680 04/16/2021   Lab Results  Component Value Date   WBC 5.4 04/16/2021   HGB 14.0 04/16/2021   HCT 40.6 04/16/2021   MCV 85 04/16/2021   PLT 293 04/16/2021   Lab Results  Component Value Date   NA 141 04/16/2021   K 4.0 04/16/2021   CO2 24  04/16/2021   GLUCOSE 91 04/16/2021   BUN 13 04/16/2021   CREATININE 0.70 04/16/2021   BILITOT 0.5 04/16/2021   ALKPHOS 91 04/16/2021  AST 21 04/16/2021   ALT 27 04/16/2021   PROT 6.3 04/16/2021   ALBUMIN 4.5 04/16/2021   CALCIUM 9.1 04/16/2021   EGFR 111 04/16/2021   Lab Results  Component Value Date   CHOL 199 04/16/2021   Lab Results  Component Value Date   HDL 49 04/16/2021   Lab Results  Component Value Date   LDLCALC 128 (H) 04/16/2021   Lab Results  Component Value Date   TRIG 125 04/16/2021   Lab Results  Component Value Date   CHOLHDL 4.1 04/16/2021   No results found for: HGBA1C     Assessment & Plan:   Problem List Items Addressed This Visit   None No orders of the defined types were placed in this encounter.   No orders of the defined types were placed in this encounter.    Follow-up: No follow-ups on file.  An After Visit Summary was printed and given to the patient.  Rip Harbour, NP [facility] [phone]

## 2021-08-28 ENCOUNTER — Other Ambulatory Visit: Payer: Self-pay

## 2021-08-28 DIAGNOSIS — H6503 Acute serous otitis media, bilateral: Secondary | ICD-10-CM

## 2021-08-28 MED ORDER — AZITHROMYCIN 250 MG PO TABS: ORAL_TABLET | ORAL | 0 refills | Status: AC

## 2021-08-28 NOTE — Telephone Encounter (Signed)
Medication failed to transmit to pharmacy. Resent.   Royce Macadamia, [place] 08/28/21 10:54 AM

## 2021-08-31 ENCOUNTER — Telehealth: Payer: Self-pay | Admitting: Genetic Counselor

## 2021-08-31 ENCOUNTER — Other Ambulatory Visit: Payer: Self-pay | Admitting: General Surgery

## 2021-08-31 ENCOUNTER — Encounter: Payer: Self-pay | Admitting: Genetic Counselor

## 2021-08-31 DIAGNOSIS — Z1379 Encounter for other screening for genetic and chromosomal anomalies: Secondary | ICD-10-CM | POA: Insufficient documentation

## 2021-08-31 DIAGNOSIS — Z170 Estrogen receptor positive status [ER+]: Secondary | ICD-10-CM

## 2021-08-31 DIAGNOSIS — C50411 Malignant neoplasm of upper-outer quadrant of right female breast: Secondary | ICD-10-CM

## 2021-08-31 NOTE — Telephone Encounter (Signed)
LM on VM that results are back and to please call. 

## 2021-08-31 NOTE — Progress Notes (Signed)
Surgical Instructions    Your procedure is scheduled on 09/10/21.  Report to [facility] Main Entrance "A" at 10:00 A.M., then check in with the Admitting office.  Call this number if you have problems the morning of surgery:  [phone]   If you have any questions prior to your surgery date call [phone]: Open Monday-Friday 8am-4pm    Remember:  Do not eat after midnight the night before your surgery  You may drink clear liquids until 9:00am the morning of your surgery.   Clear liquids allowed are: Water, Non-Citrus Juices (without pulp), Carbonated Beverages, Clear Tea, Black Coffee ONLY (NO MILK, CREAM OR POWDERED CREAMER of any kind), and Gatorade  Patient Instructions  The night before surgery:  No food after midnight. ONLY clear liquids after midnight  The day of surgery (if you do NOT have diabetes):  Drink ONE (1) Pre-Surgery Clear Ensure by 9:00am the morning of surgery. Drink in one sitting. Do not sip.  This drink was given to you during your hospital  pre-op appointment visit. Nothing else to drink after completing the  Pre-Surgery Clear Ensure.         If you have questions, please contact your surgeon's office.     Take these medicines the morning of surgery with A SIP OF WATER  azithromycin (ZITHROMAX) fluticasone (FLONASE)  loratadine (CLARITIN) LORazepam (ATIVAN) if needed vortioxetine HBr (TRINTELLIX)  As of today, STOP taking any Aspirin (unless otherwise instructed by your surgeon) Aleve, Naproxen, Ibuprofen, Motrin, Advil, Goody's, BC's, all herbal medications, fish oil, and all vitamins.     After your COVID test   You are not required to quarantine however you are required to wear a well-fitting mask when you are out and around people not in your household.  If your mask becomes wet or soiled, replace with a new one.  Wash your hands often with soap and water for 20 seconds or clean your hands with an alcohol-based hand sanitizer that  contains at least 60% alcohol.  Do not share personal items.  Notify your provider: if you are in close contact with someone who has COVID  or if you develop a fever of 100.4 or greater, sneezing, cough, sore throat, shortness of breath or body aches.             Do not wear jewelry or makeup Do not wear lotions, powders, perfumes/colognes, or deodorant. Do not shave 48 hours prior to surgery.   Do not bring valuables to the hospital. DO Not wear nail polish, gel polish, artificial nails, or any other type of covering on natural nails including finger and toenails. If patients have artificial nails, gel coating, etc. that need to be removed by a nail salon, please have this removed prior to surgery or surgery may need to be canceled/delayed if the surgeon/ anesthesia feels like the patient is unable to be adequately monitored.             [facility] is not responsible for any belongings or valuables.  Do NOT Smoke (Tobacco/Vaping)  24 hours prior to your procedure  If you use a CPAP at night, you may bring your mask for your overnight stay.   Contacts, glasses, hearing aids, dentures or partials may not be worn into surgery, please bring cases for these belongings   For patients admitted to the hospital, discharge time will be determined by your treatment team.   Patients discharged the day of surgery will not be allowed to drive  home, and someone needs to stay with them for 24 hours.  NO VISITORS WILL BE ALLOWED IN PRE-OP WHERE PATIENTS ARE PREPPED FOR SURGERY.  ONLY 1 SUPPORT PERSON MAY BE PRESENT IN THE WAITING ROOM WHILE YOU ARE IN SURGERY.  IF YOU ARE TO BE ADMITTED, ONCE YOU ARE IN YOUR ROOM YOU WILL BE ALLOWED TWO (2) VISITORS. 1 (ONE) VISITOR MAY STAY OVERNIGHT BUT MUST ARRIVE TO THE ROOM BY 8pm.  Minor children may have two parents present. Special consideration for safety and communication needs will be reviewed on a case by case basis.  Special instructions:    Oral  Hygiene is also important to reduce your risk of infection.  Remember - BRUSH YOUR TEETH THE MORNING OF SURGERY WITH YOUR REGULAR TOOTHPASTE   [facility]- Preparing For Surgery  Before surgery, you can play an important role. Because skin is not sterile, your skin needs to be as free of germs as possible. You can reduce the number of germs on your skin by washing with CHG (chlorahexidine gluconate) Soap before surgery.  CHG is an antiseptic cleaner which kills germs and bonds with the skin to continue killing germs even after washing.     Please do not use if you have an allergy to CHG or antibacterial soaps. If your skin becomes reddened/irritated stop using the CHG.  Do not shave (including legs and underarms) for at least 48 hours prior to first CHG shower. It is OK to shave your face.  Please follow these instructions carefully.     Shower the NIGHT BEFORE SURGERY and the MORNING OF SURGERY with CHG Soap.   If you chose to wash your hair, wash your hair first as usual with your normal shampoo. After you shampoo, rinse your hair and body thoroughly to remove the shampoo.  Then [employer] and genitals (private parts) with your normal soap and rinse thoroughly to remove soap.  After that Use CHG Soap as you would any other liquid soap. You can apply CHG directly to the skin and wash gently with a scrungie or a clean washcloth.   Apply the CHG Soap to your body ONLY FROM THE NECK DOWN.  Do not use on open wounds or open sores. Avoid contact with your eyes, ears, mouth and genitals (private parts). Wash Face and genitals (private parts)  with your normal soap.   Wash thoroughly, paying special attention to the area where your surgery will be performed.  Thoroughly rinse your body with warm water from the neck down.  DO NOT shower/wash with your normal soap after using and rinsing off the CHG Soap.  Pat yourself dry with a CLEAN TOWEL.  Wear CLEAN PAJAMAS to bed the night before  surgery  Place CLEAN SHEETS on your bed the night before your surgery  DO NOT SLEEP WITH PETS.   Day of Surgery: Take a shower with CHG soap. Wear Clean/Comfortable clothing the morning of surgery Do not apply any deodorants/lotions.   Remember to brush your teeth WITH YOUR REGULAR TOOTHPASTE.   Please read over the following fact sheets that you were given.

## 2021-09-01 ENCOUNTER — Encounter (HOSPITAL_COMMUNITY): Payer: Self-pay

## 2021-09-01 ENCOUNTER — Encounter (HOSPITAL_COMMUNITY)
Admission: RE | Admit: 2021-09-01 | Discharge: 2021-09-01 | Disposition: A | Discharge status: Home / Self Care | Payer: No Typology Code available for payment source | Source: Ambulatory Visit | Attending: General Surgery | Admitting: General Surgery

## 2021-09-01 ENCOUNTER — Inpatient Hospital Stay (HOSPITAL_BASED_OUTPATIENT_CLINIC_OR_DEPARTMENT_OTHER): Payer: No Typology Code available for payment source | Admitting: Hematology

## 2021-09-01 ENCOUNTER — Encounter: Payer: Self-pay | Admitting: Hematology

## 2021-09-01 ENCOUNTER — Other Ambulatory Visit: Payer: Self-pay

## 2021-09-01 VITALS — BP 142/96 | HR 67 | Temp 98.2°F | Resp 17

## 2021-09-01 VITALS — BP 128/96 | HR 68 | Temp 98.0°F | Resp 18 | Ht 64.5 in | Wt 157.4 lb

## 2021-09-01 DIAGNOSIS — Z87891 Personal history of nicotine dependence: Secondary | ICD-10-CM

## 2021-09-01 DIAGNOSIS — Z01812 Encounter for preprocedural laboratory examination: Secondary | ICD-10-CM | POA: Diagnosis not present

## 2021-09-01 DIAGNOSIS — Z01818 Encounter for other preprocedural examination: Secondary | ICD-10-CM

## 2021-09-01 DIAGNOSIS — Z808 Family history of malignant neoplasm of other organs or systems: Secondary | ICD-10-CM

## 2021-09-01 DIAGNOSIS — Z803 Family history of malignant neoplasm of breast: Secondary | ICD-10-CM

## 2021-09-01 DIAGNOSIS — Z170 Estrogen receptor positive status [ER+]: Secondary | ICD-10-CM

## 2021-09-01 DIAGNOSIS — Z807 Family history of other malignant neoplasms of lymphoid, hematopoietic and related tissues: Secondary | ICD-10-CM

## 2021-09-01 DIAGNOSIS — C50111 Malignant neoplasm of central portion of right female breast: Secondary | ICD-10-CM | POA: Diagnosis not present

## 2021-09-01 DIAGNOSIS — E2839 Other primary ovarian failure: Secondary | ICD-10-CM

## 2021-09-01 LAB — CBC
HCT: 41.3 % (ref 36.0–46.0)
Hemoglobin: 13.7 g/dL (ref 12.0–15.0)
MCH: 28.4 pg (ref 26.0–34.0)
MCHC: 33.2 g/dL (ref 30.0–36.0)
MCV: 85.7 fL (ref 80.0–100.0)
Platelets: 325 10*3/uL (ref 150–400)
RBC: 4.82 MIL/uL (ref 3.87–5.11)
RDW: 12.3 % (ref 11.5–15.5)
WBC: 7.5 10*3/uL (ref 4.0–10.5)
nRBC: 0 % (ref 0.0–0.2)

## 2021-09-01 NOTE — Telephone Encounter (Signed)
Negative genetic testing on the BRCAPlus panel.  We are still waiting on the remainder of the testing.  We will call once it is available.

## 2021-09-01 NOTE — Progress Notes (Addendum)
Sheri Moore   [phone] [phone]   Clinic New Consult Note   Patient Care Team: Rochel Brome, MD as PCP - General (Family Medicine)  Date of Service:  09/01/2021   CHIEF COMPLAINTS/PURPOSE OF CONSULTATION:  Right Breast Cancer, ER+  REFERRING PHYSICIAN:  Dr. Barry Dienes   ASSESSMENT & PLAN:  Sheri Moore is a 40 y.o. postmenopausal female with a history of   1. Cancer of central portion of right breast, Stage IA, c(T1b, N0), ER+/PR+/HER2-, Grade 2  -found on screening mammogram. Right diagnostic MM and [place] 07/29/21 showed 1 cm mass at 9 o'clock. 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 --We discussed her imaging findings and the biopsy results in great details. -She is scheduled for right lumpectomy on 09/10/21 with Dr. Barry Dienes. -I recommend a Oncotype Dx test on the surgical sample and we'll make a decision about adjuvant chemotherapy based on the Oncotype result. Written material of this test was given to her. She is young and fit, would be a good candidate for chemotherapy if her Oncotype recurrence score is high. -If her surgical sentinel lymph node is positive, I recommend mammaprint for further risk stratification and guide adjuvant chemotherapy. -Giving the strong ER and PR expression in her postmenopausal status, I recommend adjuvant endocrine therapy with aromatase inhibitor for a total of 5-10 years to reduce the risk of cancer recurrence. Potential benefits and side effects were discussed with patient and she is interested. -She will meet with radiation oncologist Dr. Lisbeth Renshaw on 09/08/21. She will likely benefit from breast radiation if she undergo lumpectomy to decrease the risk of breast cancer.  -We also discussed the breast cancer surveillance after her surgery. She will continue annual screening mammogram, self exam, and a routine office visit with lab and exam with [place]. -I encouraged her to have healthy diet and exercise regularly.   2.  Bone Health  -She has never had a DEXA. We will obtain one for baseline.  3. Genetic Testing -she had genetic counseling on 08/19/21. First half of results (BRCAplus panel) are negative.   PLAN:  -consult with Dr. Lisbeth Renshaw 11/29 -lumpectomy and SLN biopsy 12/1 with Dr. Barry Dienes -Oncotype on [place] to be done in next few months -f/u the end of radiation, or sooner if needed    Oncology History Overview Note   Cancer Staging  Cancer of central portion of right breast [facility]) Staging form: Breast, AJCC 8th Edition - Clinical stage from 08/04/2021: Stage IA (cT1b, cN0, cM0, G1, ER+, PR+, HER2-) - Signed by Truitt Merle, MD on 09/01/2021    Cancer of central portion of right breast ([place])  07/29/2021 Mammogram   EXAM: DIGITAL DIAGNOSTIC UNILATERAL RIGHT MAMMOGRAM WITH TOMOSYNTHESIS AND CAD; ULTRASOUND RIGHT BREAST LIMITED  IMPRESSION: Suspicious mass in the right breast at 9 o'clock measuring 1.0 cm. Targeted ultrasound performed in the right axilla demonstrating normal lymph nodes.   08/04/2021 Cancer Staging   Staging form: Breast, AJCC 8th Edition - Clinical stage from 08/04/2021: Stage IA (cT1b, cN0, cM0, G1, ER+, PR+, HER2-) - Signed by Truitt Merle, MD on 09/01/2021 Stage prefix: Initial diagnosis Histologic grading system: 3 grade system    08/04/2021 Initial Biopsy   Diagnosis Breast, right, needle core biopsy, 9 o'clock, ribbon shaped clip - INVASIVE DUCTAL CARCINOMA - DUCTAL CARCINOMA IN SITU - SEE COMMENT Microscopic Comment Based on the biopsy, the carcinoma appears Nottingham grade 2 of 3 and measures 0.9 cm in greatest linear extent.  PROGNOSTIC INDICATORS Results:  The tumor cells are EQUIVOCAL for Her2 (2+). Her2 by FISH will be performed and results reported separately. Estrogen Receptor: 100%, POSITIVE, STRONG STAINING INTENSITY Progesterone Receptor: 95%, POSITIVE, STRONG STAINING INTENSITY Proliferation Marker Ki67: 10%  FLUORESCENCE IN-SITU  HYBRIDIZATION Results: GROUP 5: HER2 **NEGATIVE**   09/01/2021 Initial Diagnosis   Cancer of central portion of right breast ([place])      HISTORY OF PRESENTING ILLNESS:  Sheri Moore 40 y.o. female is a here because of breast cancer. The patient was referred by Dr. Barry Dienes. The patient presents to the clinic today accompanied by her husband.   She had routine screening mammography on 07/06/21 showing a possible abnormality in the right breast. She underwent right diagnostic mammography and right breast ultrasonography on 07/29/21 showing: suspicious 1 cm mass in right breast at 9 o'clock; normal right axillary lymph nodes.  Biopsy on 08/04/21 showed: invasive ductal carcinoma, grade 2; DCIS. Prognostic indicators significant for: estrogen receptor, 100% positive and progesterone receptor, 95% positive. Proliferation marker Ki67 at 10%. HER2 negative by FISH.   Today the patient notes they felt/feeling prior/after... -she denies feeling the breast lump herself -she denies any pain or other concerns   She has a PMHx of.... -depression, on trintellix -s/p hysterectomy with BSO (three separate surgeries, once for uterus, and one for each ovary), for adhesions -insomnia and fatigue, on [employer]... 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 [place] -two maternal great-aunts with breast cancer, mother with a form of uterine cancer -previously smoked for 3-4 years, <1/2 ppd   GYN HISTORY  Menarchal: xx LMP: xx Contraceptive: HRT: none GP: 1    REVIEW OF SYSTEMS:    Constitutional: Denies fevers, chills or abnormal night sweats Eyes: Denies blurriness of vision, double vision or watery eyes Ears, nose, mouth, throat, and face: Denies mucositis or sore throat Respiratory: Denies cough, dyspnea or wheezes Cardiovascular: Denies palpitation, chest discomfort or lower extremity swelling Gastrointestinal:  Denies nausea, heartburn or  change in bowel habits Skin: Denies abnormal skin rashes Lymphatics: Denies new lymphadenopathy or easy bruising Neurological:Denies numbness, tingling or new weaknesses Behavioral/Psych: Mood is stable, no new changes  All other systems were reviewed with the patient and are negative.   MEDICAL HISTORY:  Past Medical History:  Diagnosis Date   Anxiety    Breast cancer ([place]) 07/06/21   first seen on mammogram done 07/06/21   Depression    Family history of breast cancer    Family history of melanoma    Family history of uterine cancer    Fibroid    Gastroschisis     SURGICAL HISTORY: Past Surgical History:  Procedure Laterality Date   ABDOMINAL HYSTERECTOMY     supracervical hysterectomy   ABDOMINAL SURGERY     as a newborn, gastrogheschesis   OOPHORECTOMY      SOCIAL HISTORY: Social History   Socioeconomic History   Marital status: Married    Spouse name: Not on file   Number of children: 1   Years of education: College   Highest education level: Not on file  Occupational History   Occupation: [employer]  Tobacco Use   Smoking status: Former    Packs/day: 0.25    Years: 3.00    Pack years: 0.75    Types: Cigarettes    Quit date: 10/11/2001    Years since quitting: 19.9   Smokeless tobacco: Former  Scientific laboratory technician Use: Never used  Substance and Sexual Activity   Alcohol use: Not Currently    Comment: occ   Drug use: No   Sexual activity: Yes    Partners: Female    Birth control/protection: Surgical    Comment: supracervical hysterectomy  Other Topics Concern   Not on file  Social History Narrative   Lives with husband and son   Caffeine use: rarely   Right handed    Social Determinants of Health   Financial Resource Strain: Not on file  Food Insecurity: Not on file  Transportation Needs: Not on file  Physical Activity: Not on file  Stress: Not on file  Social Connections: Not on file  Intimate Partner Violence: Not on file     FAMILY HISTORY: Family History  Problem Relation Age of Onset   Uterine cancer Mother 41   Diabetes Mother    Hypertension Mother    Thyroid disease Mother    Obesity Mother    Subarachnoid hemorrhage Father    Melanoma Father 36   Diabetes Father    Diabetes Maternal Grandmother    Hypertension Maternal Grandmother    Melanoma Paternal Grandfather 59   Diabetes Paternal Grandfather    Breast cancer Other 79       MGMs sister   Breast cancer Other 72       MGMs sister   Breast cancer Other 52       MGF's sister   Breast cancer Other        dx < 31; PGMs sister   Diabetes Sister    Obesity Sister     ALLERGIES:  has No Known Allergies.  MEDICATIONS:  Current Outpatient Medications  Medication Sig Dispense Refill   amphetamine-dextroamphetamine (ADDERALL XR) 25 MG 24 hr capsule TAKE 1 CAPSULE BY MOUTH EVERY MORNING. 30 capsule 0   azithromycin (ZITHROMAX) 250 MG tablet Take 2 tablets on day 1, then 1 tablet daily on days 2 through 5 6 tablet 0   fluticasone (FLONASE) 50 MCG/ACT nasal spray Place 2 sprays into both nostrils daily.     loratadine (CLARITIN) 10 MG tablet Take 10 mg by mouth daily.     LORazepam (ATIVAN) 0.5 MG tablet Take 1 tablet (0.5 mg total) by mouth every 8 (eight) hours. (Patient taking differently: Take 0.5 mg by mouth every 8 (eight) hours as needed for anxiety.) 30 tablet 1   vortioxetine HBr (TRINTELLIX) 20 MG TABS tablet Take 1 tablet by mouth daily. 90 tablet 0   zolpidem (AMBIEN) 10 MG tablet TAKE 1 TABLET BY MOUTH ONCE DAILY AT BEDTIME AS NEEDED FOR SLEEP (Patient taking differently: Take 10 mg by mouth at bedtime.) 90 tablet 1   No current facility-administered medications for this visit.    PHYSICAL EXAMINATION: ECOG PERFORMANCE STATUS: 0 - Asymptomatic  Vitals:   09/01/21 1455  BP: (!) 128/96  Pulse: 68  Resp: 18  Temp: 98 F (36.7 C)  SpO2: 100%   Filed Weights   09/01/21 1455  Weight: 157 lb 6.4 oz (71.4 kg)     GENERAL:alert, no distress and comfortable SKIN: skin color, texture, turgor are normal, no rashes or significant lesions EYES: normal, Conjunctiva are pink and non-injected, sclera clear  NECK: supple, thyroid normal size, non-tender, without nodularity LYMPH:  no palpable lymphadenopathy in the cervical, axillary  LUNGS: clear to auscultation and percussion with normal breathing effort HEART: regular rate & rhythm and no murmurs and no lower extremity edema ABDOMEN:abdomen soft, non-tender and normal bowel sounds Musculoskeletal:no cyanosis  of digits and no clubbing  NEURO: alert & oriented x 3 with fluent speech, no focal motor/sensory deficits BREAST: No palpable mass, nodules or adenopathy bilaterally. Breast exam benign.  LABORATORY DATA:  I have reviewed the data as listed CBC Latest Ref Rng & Units 09/01/2021 04/16/2021 12/29/2020  WBC 4.0 - 10.5 K/uL 7.5 5.4 6.0  Hemoglobin 12.0 - 15.0 g/dL 13.7 14.0 14.6  Hematocrit 36.0 - 46.0 % 41.3 40.6 42.8  Platelets 150 - 400 K/uL 325 293 321    CMP Latest Ref Rng & Units 04/16/2021 12/29/2020 09/30/2020  Glucose 65 - 99 mg/dL 91 97 99  BUN 6 - 24 mg/dL _0 Creatinine 0.57 - 1.00 mg/dL 0.70 0.69 0.81  Sodium 134 - 144 mmol/L 141 141 141  Potassium 3.5 - 5.2 mmol/L 4.0 4.5 4.3  Chloride 96 - 106 mmol/L 100 104 100  CO2 20 - 29 mmol/L _1 Calcium 8.7 - 10.2 mg/dL 9.1 9.0 10.3(H)  Total Protein 6.0 - 8.5 g/dL 6.3 6.5 7.5  Total Bilirubin 0.0 - 1.2 mg/dL 0.5 0.6 0.4  Alkaline Phos 44 - 121 IU/L 91 85 100  AST 0 - 40 IU/L 21 28 39  ALT 0 - 32 IU/L 27 33(H) 54(H)     RADIOGRAPHIC STUDIES: I have personally reviewed the radiological images as listed and agreed with the findings in the report. MM CLIP PLACEMENT RIGHT  Result Date: 08/04/2021 CLINICAL DATA:  Status post ultrasound-guided biopsy for a RIGHT breast mass at the 9 o'clock axis. EXAM: 3D DIAGNOSTIC RIGHT MAMMOGRAM POST ULTRASOUND BIOPSY COMPARISON:  Previous  exam(s). FINDINGS: 3D Mammographic images were obtained following ultrasound guided biopsy of the RIGHT breast mass at the 9 o'clock axis. The biopsy marking clip is in expected position at the site of biopsy. IMPRESSION: 1. Appropriate positioning of the ribbon shaped biopsy marking clip at the site of biopsy in the outer RIGHT breast. 2. Small postprocedural hematoma at the biopsy site. Final Assessment: Post Procedure Mammograms for Marker Placement Electronically Signed   By: Franki Cabot M.D.   On: 08/04/2021 10:02  [place] RT BREAST BX W LOC DEV 1ST LESION IMG BX SPEC US GUIDE  Addendum Date: 08/05/2021   ADDENDUM REPORT: 08/05/2021 14:30 ADDENDUM: Pathology revealed GRADE II INVASIVE DUCTAL CARCINOMA- DUCTAL CARCINOMA IN SITU of the RIGHT breast, 9 o'clock, ribbon clip. This was found to be concordant by Dr. Franki Cabot. Pathology results were discussed with the patient by Dr. Rochel Brome of [facility] in [place]. The patient reported doing well after the biopsy with tenderness at the site. Post biopsy instructions and care were reviewed and questions were answered. The patient was encouraged to call [facility] for any additional concerns. My direct number was provided. Per patient request, surgical consultation has been arranged with Dr. Stark Klein at [facility] on August 17, 2021. Breast MRI is scheduled on August 25, 2021 at [place]. Pathology results reported by Stacie Acres RN on 08/05/2021. Electronically Signed   By: Franki Cabot M.D.   On: 08/05/2021 14:30   Result Date: 08/05/2021 CLINICAL DATA:  Patient with a highly suspicious mass in the RIGHT breast at the 9 o'clock axis presents today for ultrasound-guided core biopsy. EXAM: ULTRASOUND GUIDED RIGHT BREAST CORE NEEDLE BIOPSY COMPARISON:  Previous exam(s). PROCEDURE: I met with the patient and we discussed the procedure of  ultrasound-guided biopsy, including benefits and alternatives. We discussed  the high likelihood of a successful procedure. We discussed the risks of the procedure, including infection, bleeding, tissue injury, clip migration, and inadequate sampling. Informed written consent was given. The usual time-out protocol was performed immediately prior to the procedure. Lesion quadrant: 9 o'clock Using sterile technique and 1% Lidocaine as local anesthetic, under direct ultrasound visualization, a 12 gauge spring-loaded device was used to perform biopsy of the RIGHT breast mass at the 9 o'clock axis using a inferolateral approach. At the conclusion of the procedure a ribbon shaped tissue marker clip was deployed into the biopsy cavity. Follow up 2 view mammogram was performed and dictated separately. IMPRESSION: Ultrasound guided biopsy of the RIGHT breast mass at the 9 o'clock axis. No apparent complications. Electronically Signed: By: Franki Cabot M.D. On: 08/04/2021 09:46    No orders of the defined types were placed in this encounter.   All questions were answered. The patient knows to call the clinic with any problems, questions or concerns. The total time spent in the appointment was 45 minutes.     Truitt Merle, MD 09/01/2021   I, Wilburn Mylar, am acting as scribe for Truitt Merle, MD.   I have reviewed the above documentation for accuracy and completeness, and I agree with the above.

## 2021-09-02 ENCOUNTER — Encounter: Payer: Self-pay | Admitting: Psychology

## 2021-09-02 ENCOUNTER — Encounter: Payer: Self-pay | Admitting: Hematology

## 2021-09-02 ENCOUNTER — Encounter: Payer: No Typology Code available for payment source | Attending: Psychology | Admitting: Psychology

## 2021-09-02 ENCOUNTER — Other Ambulatory Visit: Payer: Self-pay

## 2021-09-02 DIAGNOSIS — R4189 Other symptoms and signs involving cognitive functions and awareness: Secondary | ICD-10-CM | POA: Diagnosis not present

## 2021-09-02 DIAGNOSIS — F418 Other specified anxiety disorders: Secondary | ICD-10-CM | POA: Diagnosis not present

## 2021-09-02 DIAGNOSIS — R41840 Attention and concentration deficit: Secondary | ICD-10-CM | POA: Diagnosis not present

## 2021-09-02 DIAGNOSIS — R9082 White matter disease, unspecified: Secondary | ICD-10-CM | POA: Insufficient documentation

## 2021-09-02 NOTE — Progress Notes (Signed)
Neuropsychological Consultation   Patient:   Sheri Moore   DOB:   28-Apr-1981  MR Number:  [id number]  Location:  [facility] [place], Sheri Moore Dept: [phone]           Date of Service:   09/02/2021  Start Time:   10 AM End Time:   11 AM  Today's visit was an in person visit that was conducted in my outpatient clinic office with the patient myself present.  Provider/Observer:  Ilean Skill, Psy.D.       Clinical Neuropsychologist       Billing Code/Service: 96158/96159  Reason for Service:  Sheri Moore is a 40 year old female referred for neuropsychological consultation by Sheri Brome, MD who is the patient's Moore.  The patient is also been followed by Dr. Felecia Shelling who is her treating neurologist due to these neurobehavioral issues secondary to white matter and subcortical changes.  The patient has been diagnosed with white matter changes/disease of unknown etiological factors although MS has been considered and potentially ruled out and currently is viewed is most consistent with small vessel disease/microvascular ischemic changes.  The patient is describing significant difficulties with retrieval type memory deficits, problems with attention and concentration, slowed information processing, difficulties with problem-solving executive functioning and vertigo.  Patient also has had some emotional features including depressive and anxiety based symptomatology.  The patient has been followed by Arlice Colt, MD with [facility] since at least 2020.  Initially, the patient began experiencing vertigo and headaches around 2004.  Brain MRI performed showed white matter abnormalities and the patient was followed initially by Dr. Jannifer Franklin for neurological consultation.  LP was performed and cerebrospinal fluid was interpreted as  normal in 2004.  Sheri did have some difficulties with her spinal tap.  The patient was followed by Dr. Dellis Filbert with [facility] between 2006 in 2009 and had a couple of MRI scans performed.  Besides the vertigo patient also reported and continues to report cognitive changes including reduced focus and attention and concentration difficulties with consideration of MS as the etiological factors.  In 2019 Sheri had a reoccurrence of symptoms including the onset of lip tingling bilaterally as well as sensation changes on her face and left arm and hand tingling.  This sensation was intermittent.  Patient is also been describing for the past couple years her arms feeling very heavy without tingling.  Fatigue is persisted for many years and is exacerbated by heat and will feel too tired to do other activities when Sheri gets fatigued.  The patient describes adequate sleep patterns but utilizes Ambien to be able to sleep.  Sheri has been using Ambien for several years now.  Sheri has been tried on various psychotropic medications including Trintellix and Cymbalta.  Patient has described occasional headaches that were mostly mild to moderate and helped by Tylenol.  Sheri denies them typically associated with nausea, photophobia or phonophobia and it is unclear whether they are migrainous in nature.  Patient has had a normal sleep study in the past.  More recently, the patient has reported increased feelings of heaviness in her arms and occasional tingling in her left arm and third and fourth fingers.  Gait and balance are described as being fine.  These are similar to symptoms Sheri experienced in 2020 that had improved before reoccurring.  Patient continues to describe cognitive difficulties and has  been tried on various stimulant type medications and is currently taking Adderall.  The patient has had multiple MRIs and while initial reports stated probable MS Dr. Felecia Shelling feels like the minimal changes over the past 15 years and the  images are more consistent with chronic microvascular ischemic changes rather than MS.  Recent labs conducted in July of this year including ANA, B12, vitamin D, TSH, CMP, CBC all were normal and felt to be noncontributory.  During the clinical interview the patient describes her typical headaches and vertigo but denies any photophobia or phonophobia and rare nausea symptoms.  Sheri describes any visual aura type symptoms or other cognitive changes that immediately preceded her headaches.  Sheri does report that during a bad headache that Sheri experiences increase in her cognitive difficulties.  The patient describes her memory issues to include situations like clicking on her browser to go to website and then not remembering why Sheri was going online to look something up.  The patient reports that Sheri has to keep her medications in a pillbox because Sheri will take her medications and then remember going to take her medications again and not remembering Sheri had already taken them or not.  Patient will forget whether or not Sheri locked her doors and have to go back and look.  Patient reports that Sheri will take clothes out of the dryer knowing that Sheri has to put more close into the dryer from the washer and then leave to fold her close and forget to come back to [place] in the dryer.  The patient reports that her husband or son will ask her simple questions and Sheri has very delayed response and that Sheri has to think about it.  Patient has difficulty keeping up when Sheri is cooking food and times where Sheri will go to work and have no memory of whether or not Sheri took her son to school or other activities going to work.  The patient denies any history of concussive events or motor vehicle accidents and no loss of consciousness in the past.  There is no family history of neurological conditions except her father recently having a subarachnoid hemorrhage in the past year.  He has recovered well.  The patient reports that  Sheri has to take Ambien at night to sleep and will take her Ambien around 9 or 930 and is in bed by 10 or 1030 and wakes up at 6:45 AM.  Sheri describes her appetite is good and has 3 regular meals each day.  The patient reports that the primary reason for this referral was to see if there was something Sheri could do to help with her symptoms as they are not being able to find particular etiological factors and other than the Adderall during the day and Ambien at night Sheri is not having any other specific interventions to help.  Behavioral Observation: Sheri Moore  presents as a 40 y.o.-year-old Right handed Caucasian Female who appeared her stated age. her dress was Appropriate and Sheri was Well Groomed and her manners were Appropriate to the situation.  her participation was indicative of Appropriate behaviors.  There were physical disabilities noted.  Sheri displayed an appropriate level of cooperation and motivation.     Interactions:    Active Appropriate  Attention:   abnormal and attention span appeared shorter than expected for age  Memory:   abnormal; remote memory intact, recent memory impaired  Visuo-spatial:  not examined  Speech (Volume):  normal  Speech:   normal; normal  Thought Process:  Coherent and Relevant  Though Content:  WNL; not suicidal and not homicidal  Orientation:   person, place, time/date, and situation  Judgment:   Good  Planning:   Fair  Affect:    Anxious  Mood:    Dysphoric  Insight:   Good  Intelligence:   normal  Marital Status/Living: The patient was born and raised in [place] in [place] along with 1 sister.  Sheri is married and has been married for 81 years and they have a 49 year old son.  There are no major stressors or significant past medical history other than what is going on with her changes on MRI.  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  Hobbies and interest include camping, spending time with her pets and family and baseball.  Substance Use:  No concerns of substance abuse are reported.  Patient denies any substance use or abuse.  Education:   College  Medical History:   Past Medical History:  Diagnosis Date   Anxiety    Breast cancer ([place]) 07/06/21   first seen on mammogram done 07/06/21   Depression    Family history of breast cancer    Family history of melanoma    Family history of uterine cancer    Fibroid    Gastroschisis          Patient Active Problem List   Diagnosis Date Noted   Cancer of central portion of right breast ([place]) 09/01/2021   Genetic testing 08/31/2021   Family history of breast cancer 08/19/2021   Family history of melanoma 08/19/2021   Family history of uterine cancer 08/19/2021   Cognitive changes 04/29/2021   Attention deficit disorder (ADD) without hyperactivity 01/09/2020   GAD (generalized anxiety disorder) 01/09/2020   Somnolence, daytime 01/09/2020   Facial numbness 12/12/2018   White matter abnormality on MRI of brain 11/09/2018   Other fatigue 11/09/2018   Depression with anxiety 11/09/2018   Left arm numbness 10/17/2018   Left arm weakness 10/17/2018   Sprain of anterior talofibular ligament of right ankle 05/08/2017       Psychiatric History:  No prior psychiatric history other than some issues with anxiety.  Family Med/Psych History:  Family History  Problem Relation Age of Onset   Uterine cancer Mother 38   Diabetes Mother    Hypertension Mother    Thyroid disease Mother    Obesity Mother    Subarachnoid hemorrhage Father    Melanoma Father 60   Diabetes Father    Diabetes Maternal Grandmother    Hypertension Maternal Grandmother    Melanoma Paternal Grandfather 22   Diabetes Paternal Grandfather    Breast cancer  Other 71       MGMs sister   Breast cancer Other 6       MGMs sister   Breast cancer Other 31       MGF's sister   Breast cancer Other        dx < 70; PGMs sister   Diabetes Sister    Obesity Sister     Impression/DX:  Sheri Moore is a 40 year old female referred for neuropsychological consultation by Sheri Brome, MD who is the patient's  Moore.  The patient is also been followed by Dr. Felecia Shelling who is her treating neurologist due to these neurobehavioral issues secondary to white matter and subcortical changes.  The patient has been diagnosed with white matter changes/disease of unknown etiological factors although MS has been considered and potentially ruled out and currently is viewed is most consistent with small vessel disease/microvascular ischemic changes.  The patient is describing significant difficulties with retrieval type memory deficits, problems with attention and concentration, slowed information processing, difficulties with problem-solving executive functioning and vertigo.  Patient also has had some emotional features including depressive and anxiety based symptomatology.  Disposition/Plan:  Today, we worked on therapeutic interventions as this was our first therapeutic session.  We reviewed issues that were addressed during the first visit back in August around sleep, physical activity and other foundational aspects.  The patient reports that Sheri has been actively trying to work on normalizing her sleep pattern and had been having increased sustained physical activity until recently.  However, the patient reports that Sheri has recently experienced "shit show" regarding her overall life.  The patient reports that her grandmother recently passed away and while Sheri had been progressively worsening over the prior month this was still a significant stressor for the patient.  Sheri reports that Sheri was then diagnosed with grade 2 invasive ductal carcinoma breast cancer of the right breast.   The patient has met with her oncologist and understands that they have an effective strategy for addressing this type of cancer.  Sheri reports that Sheri is scheduled for a lumpectomy in December as well as radiation treatments.  Diagnosis:    White matter abnormality on MRI of brain  Cognitive changes  Attention and concentration deficit  Depression with anxiety         Electronically Signed   _______________________ Ilean Skill, Psy.D. Clinical Neuropsychologist

## 2021-09-04 ENCOUNTER — Telehealth: Payer: Self-pay | Admitting: *Deleted

## 2021-09-04 ENCOUNTER — Encounter: Payer: Self-pay | Admitting: *Deleted

## 2021-09-04 NOTE — Telephone Encounter (Signed)
Spoke with patient to follow up from new patient appt and assess navigation needs.  Patient denies any questions or concerns at this time.  Encouraged patient to call should anything arise. Patient verbalized understanding. Contact infor given.

## 2021-09-07 NOTE — Progress Notes (Signed)
New Breast Cancer Diagnosis: Right Breast- Central Portion  Did patient present with symptoms (if so, please note symptoms) or screening mammography?:Screening Mass    Location and Extent of disease :right breast. Located at 9 o'clock position, measured  1 cm in greatest dimension. Adenopathy no.  Histology per Pathology Report: grade 2, Invasive Ductal Carcinoma and DCIS 08/04/2021  Receptor Status: ER(positive), PR (positive), Her2-neu (negative), Ki-(10%)    Surgeon and surgical plan, if any: Dr. Barry Dienes -Right Breast Lumpectomy with radioactive seed and SLN biopsy 09/10/2021   Medical oncologist, treatment if any:   Dr. Burr Medico 09/01/2021 -I recommend a Oncotype Dx test on the surgical sample and we'll make a decision about adjuvant chemotherapy based on the Oncotype result. -She is young and fit, would be a good candidate for chemotherapy if her Oncotype recurrence score is high. -If her surgical sentinel lymph node is positive, I recommend mammaprint for further risk stratification and guide adjuvant chemotherapy. -Giving the strong ER and PR expression in her postmenopausal status, I recommend adjuvant endocrine therapy with aromatase inhibitor for a total of 5-10 years to reduce the risk of cancer recurrence.  -She will meet with radiation oncologist Dr. Lisbeth Renshaw on 09/08/21. She will likely benefit from breast radiation if she undergo lumpectomy to decrease the risk of breast cancer.     Family History of Breast/Ovarian/Prostate Cancer: MGM's sister had breast, MGF's sister had breast, PGM's sister had breast.   Lymphedema issues, if any: No    Pain issues, if any: No    SAFETY ISSUES: Prior radiation? No Pacemaker/ICD? no Possible current pregnancy? Hysterectomy Is the patient on methotrexate? No  Current Complaints / other details:

## 2021-09-08 ENCOUNTER — Encounter: Payer: Self-pay | Admitting: Radiation Oncology

## 2021-09-08 ENCOUNTER — Other Ambulatory Visit: Payer: Self-pay

## 2021-09-08 ENCOUNTER — Ambulatory Visit
Admission: RE | Admit: 2021-09-08 | Discharge: 2021-09-08 | Disposition: A | Discharge status: Home / Self Care | Payer: No Typology Code available for payment source | Source: Ambulatory Visit | Attending: Radiation Oncology | Admitting: Radiation Oncology

## 2021-09-08 VITALS — Ht 65.0 in | Wt 157.0 lb

## 2021-09-08 DIAGNOSIS — C50111 Malignant neoplasm of central portion of right female breast: Secondary | ICD-10-CM

## 2021-09-08 NOTE — Progress Notes (Signed)
Radiation Oncology         [phone] ________________________________  Name: Sheri Moore        MRN: [id number]  Date of Service: 09/08/2021 DOB: [birth year]  CC:Cox, Elnita Maxwell, MD  Stark Klein, MD     REFERRING PHYSICIAN: Stark Klein, MD   DIAGNOSIS: The encounter diagnosis was Cancer of central portion of right breast [facility]).   HISTORY OF PRESENT ILLNESS: Sheri Moore is a 40 y.o. female seen at the request of Dr. Barry Dienes for a new diagnosis of right breast cancer. She had an asymmetry and possible mass in the right breast. Diagnostic imaging revealed a mass measuring 1 cm in the 9:00 position and the asymmetry resolved. Her right axilla was negative for adenopathy.  And a biopsy on 08/04/21 showed a grade 2 invasive ductal carcinoma with associated DCIS. The tumor was ER/PR positive, HER2 was negative with a Ki 67 of 10%. She's scheduled to undergo lumpectomy with sentinel node biopsy on 09/10/21 and Dr. Burr Medico anticipated Oncotype Dx to determine if systemic therapy is needed. She's seen today on MyChart to discuss adjuvant radiotherapy.    PREVIOUS RADIATION THERAPY: No   PAST MEDICAL HISTORY:  Past Medical History:  Diagnosis Date   Anxiety    Breast cancer ([place]) 07/06/21   first seen on mammogram done 07/06/21   Depression    Family history of breast cancer    Family history of melanoma    Family history of uterine cancer    Fibroid    Gastroschisis        PAST SURGICAL HISTORY: Past Surgical History:  Procedure Laterality Date   ABDOMINAL HYSTERECTOMY     supracervical hysterectomy   ABDOMINAL SURGERY     as a newborn, gastrogheschesis   OOPHORECTOMY       FAMILY HISTORY:  Family History  Problem Relation Age of Onset   Uterine cancer Mother 79   Diabetes Mother    Hypertension Mother    Thyroid disease Mother    Obesity Mother    Subarachnoid hemorrhage Father    Melanoma Father 68   Diabetes Father    Diabetes Maternal Grandmother     Hypertension Maternal Grandmother    Melanoma Paternal Grandfather 3   Diabetes Paternal Grandfather    Breast cancer Other 42       MGMs sister   Breast cancer Other 64       MGMs sister   Breast cancer Other 71       MGF's sister   Breast cancer Other        dx < 35; PGMs sister   Diabetes Sister    Obesity Sister      SOCIAL HISTORY:  reports that she quit smoking about 19 years ago. Her smoking use included cigarettes. She has a 0.75 pack-year smoking history. She has quit using smokeless tobacco. She reports that she does not currently use alcohol. She reports that she does not use drugs. The patient is married and lives in [place]. She works in a [employer] in [place]. She has a 51 year old son who's active with basketball as well as travel baseball during season.   ALLERGIES: Patient has no known allergies.   MEDICATIONS:  Current Outpatient Medications  Medication Sig Dispense Refill   amphetamine-dextroamphetamine (ADDERALL XR) 25 MG 24 hr capsule TAKE 1 CAPSULE BY MOUTH EVERY MORNING. 30 capsule 0   fluticasone (FLONASE) 50 MCG/ACT nasal spray Place 2 sprays into both  nostrils daily.     loratadine (CLARITIN) 10 MG tablet Take 10 mg by mouth daily.     LORazepam (ATIVAN) 0.5 MG tablet Take 1 tablet (0.5 mg total) by mouth every 8 (eight) hours. (Patient taking differently: Take 0.5 mg by mouth every 8 (eight) hours as needed for anxiety.) 30 tablet 1   vortioxetine HBr (TRINTELLIX) 20 MG TABS tablet Take 1 tablet by mouth daily. 90 tablet 0   zolpidem (AMBIEN) 10 MG tablet TAKE 1 TABLET BY MOUTH ONCE DAILY AT BEDTIME AS NEEDED FOR SLEEP (Patient taking differently: Take 10 mg by mouth at bedtime.) 90 tablet 1   No current facility-administered medications for this encounter.     REVIEW OF SYSTEMS: On review of systems, the patient reports that she is doing well without complaints of breast pain. No other complaints are verbalized.       PHYSICAL EXAM:  Wt Readings from Last 3 Encounters:  09/01/21 157 lb 6.4 oz (71.4 kg)  08/27/21 154 lb (69.9 kg)  05/12/21 155 lb (70.3 kg)    In general this is a well appearing caucasian female in no acute distress. She's alert and oriented x4 and appropriate throughout the examination. Cardiopulmonary assessment is negative for acute distress and she exhibits normal effort. Bilateral breast exam is deferred.    ECOG = 0  0 - Asymptomatic (Fully active, able to carry on all predisease activities without restriction)  1 - Symptomatic but completely ambulatory (Restricted in physically strenuous activity but ambulatory and able to carry out work of a light or sedentary nature. For example, light housework, office work)  2 - Symptomatic, <50% in bed during the day (Ambulatory and capable of all self care but unable to carry out any work activities. Up and about more than 50% of waking hours)  3 - Symptomatic, >50% in bed, but not bedbound (Capable of only limited self-care, confined to bed or chair 50% or more of waking hours)  4 - Bedbound (Completely disabled. Cannot carry on any self-care. Totally confined to bed or chair)  5 - Death   Eustace Pen MM, Creech RH, Tormey DC, et al. [phone]). "Toxicity and response criteria of the [facility]". [place] Oncol. 5 (6): 649-55    LABORATORY DATA:  Lab Results  Component Value Date   WBC 7.5 09/01/2021   HGB 13.7 09/01/2021   HCT 41.3 09/01/2021   MCV 85.7 09/01/2021   PLT 325 09/01/2021   Lab Results  Component Value Date   NA 141 04/16/2021   K 4.0 04/16/2021   CL 100 04/16/2021   CO2 24 04/16/2021   Lab Results  Component Value Date   ALT 27 04/16/2021   AST 21 04/16/2021   ALKPHOS 91 04/16/2021   BILITOT 0.5 04/16/2021      RADIOGRAPHY: No results found.     IMPRESSION/PLAN: 1. Stage IA, [id number] grade 2 invasive ductal carcinoma of the right breast. Dr. Lisbeth Renshaw discusses the pathology  findings and reviews the nature of early stage breast disease. The consensus from the breast conference includes breast conservation with lumpectomy with sentinel node biopsy. Dr. Burr Medico anticipates Oncotype Dx score to determine a role for systemic therapy. Provided that chemotherapy is not indicated, the patient's course would then be followed by external radiotherapy to the breast  to reduce risks of local recurrence followed by antiestrogen therapy. We discussed the risks, benefits, short, and long term effects of radiotherapy, as well as the curative intent, and the  patient is interested in proceeding. Dr. Lisbeth Renshaw discusses the delivery and logistics of radiotherapy and anticipates a course of 6 1/2 weeks of radiotherapy to the right breast. We will see her back a few weeks after surgery to discuss the simulation process and anticipate we starting radiotherapy about 4-6 weeks after surgery.    This encounter was provided by telemedicine platform MyChart.  The patient has provided two factor identification and has given verbal consent for this type of encounter and has been advised to only accept a meeting of this type in a secure network environment. The time spent during this encounter was 60 minutes including preparation, discussion, and coordination of the patient's care. The attendants for this meeting include Blenda Nicely, RN, Dr. Lisbeth Renshaw, Hayden Pedro  and Erie Noe.  During the encounter,  Blenda Nicely, RN, Dr. Lisbeth Renshaw, and Hayden Pedro were located at [facility] Radiation Oncology Department.  Sheri Moore was located at work.    The above documentation reflects my direct findings during this shared patient visit. Please see the separate note by Dr. Lisbeth Renshaw on this date for the remainder of the patient's plan of care.    Carola Rhine, [facility]    **Disclaimer: This note was dictated with voice recognition software. Similar sounding words can  inadvertently be transcribed and this note may contain transcription errors which may not have been corrected upon publication of note.**

## 2021-09-08 NOTE — H&P (Signed)
REFERRING PHYSICIAN: Dirk Dress  PROVIDER: Georgianne Fick, MD  Care Team: Patient Care Team: Rulon Sera, MD as PCP - General (Family Medicine)   MRN: [id number] DOB: 12-19-1980 DATE OF ENCOUNTER: 08/17/2021  Subjective   Chief Complaint: New Consultation (Breast Cancer)   History of Present Illness: Sheri Moore is a 40 y.o. female who is seen today as an office consultation at the request of Dr. Tobie Poet for evaluation of New Consultation (Breast Cancer) .   Pt has a new screening detected right breast cancer 07/2021. She was seen to have a possible mass. Diagnostic imaging confirmed this and a 1-1.2 cm lesion was seen at 9 o'clock. Core needle biopsy demonstrated a grade 2 invasive ductal carcinoma, strongly ER/PR positive, her 2 negative, Ki 67 10%. The patient has a mother who had uterine cancer and two maternal aunts with breast cancer. She hasn't had a prior cancer diagnosis before this.   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   Diagnostic mammogram: BCG 07/29/21 ACR Breast Density Category b: There are scattered areas of fibroglandular density.   FINDINGS: Mammogram:   Spot compression tomosynthesis views of the right breast were performed demonstrating persistence of an irregular spiculated mass in the outer right breast measuring approximately 1.2 cm. An additional questioned adjacent asymmetry is less conspicuous on the spot imaging.   Ultrasound:   Targeted ultrasound performed in the right breast at 9 o'clock 4 cm from the nipple demonstrating an irregular hypoechoic mass with echogenic rim measuring 0.7 x 0.8 x 1.0 cm. This corresponds to the mammographic finding. At 9 o'clock 5 cm from the nipple there is a mildly dilated anechoic duct without intraductal mass.   Targeted ultrasound performed in the right axilla demonstrating normal lymph nodes.   IMPRESSION: Suspicious mass in the right breast at 9 o'clock measuring 1.0  cm.   RECOMMENDATION: Ultrasound-guided core needle biopsy of the right breast mass at 9 o'clock.   I have discussed the findings and recommendations with the patient who agrees to proceed with biopsy. The patient will be scheduled for the biopsy appointment prior to leaving the office today.   BI-RADS CATEGORY 5: Highly suggestive of malignancy.  Pathology core needle biopsy: 08/04/21 Breast, right, needle core biopsy, 9 o'clock, ribbon shaped clip - INVASIVE DUCTAL CARCINOMA - DUCTAL CARCINOMA IN SITU Based on the biopsy, the carcinoma appears Nottingham grade 2 of 3  Receptors: Estrogen Receptor: 100%, POSITIVE, STRONG STAINING INTENSITY Progesterone Receptor: 95%, POSITIVE, STRONG STAINING INTENSITY Proliferation Marker Ki67: 10% GROUP 5: HER2 **NEGATIVE**  Review of Systems: A complete review of systems was obtained from the patient. I have reviewed this information and discussed as appropriate with the patient. See HPI as well for other ROS.  Review of Systems  All other systems reviewed and are negative.   Medical History: Past Medical History:  Diagnosis Date   Anxiety   Patient Active Problem List  Diagnosis   Malignant neoplasm of upper-outer quadrant of right breast in female, estrogen receptor positive (CMS-HCC)   Family history of cancer   Past Surgical History:  Procedure Laterality Date   HYSTERECTOMY   OOpherectomy    No Known Allergies  Current Outpatient Medications on File Prior to Visit  Medication Sig Dispense Refill   LORazepam (ATIVAN) 0.5 MG tablet Take by mouth   zolpidem (AMBIEN) 10 mg tablet TAKE 1 TABLET BY MOUTH ONCE DAILY AT BEDTIME AS NEEDED FOR SLEEP   TRINTELLIX 20 mg tablet  No current facility-administered medications on file prior to visit.   Family History  Problem Relation Age of Onset   Obesity Mother   High blood pressure (Hypertension) Mother   Hyperlipidemia (Elevated cholesterol) Mother   Diabetes Mother   Skin  cancer Father   Diabetes Father   Obesity Sister   High blood pressure (Hypertension) Sister   Hyperlipidemia (Elevated cholesterol) Sister   Diabetes Sister    Social History   Tobacco Use  Smoking Status Former   Types: Cigarettes   Quit date: 2003   Years since quitting: 19.8  Smokeless Tobacco Never    Social History   Socioeconomic History   Marital status: Married  Tobacco Use   Smoking status: Former  Types: Cigarettes  Quit date: 2003  Years since quitting: 19.8   Smokeless tobacco: Never  Vaping Use   Vaping Use: Never used  Substance and Sexual Activity   Alcohol use: Yes   Drug use: Never   Objective:   Vitals:  08/17/21 1504  BP: 130/80  Pulse: 81  SpO2: 98%  Weight: 70.7 kg (155 lb 12.8 oz)  Height: 165.1 cm ('5\' 5"' )   Body mass index is 25.93 kg/m.  Gen: No acute distress. Well nourished and well groomed.  Neurological: Alert and oriented to person, place, and time. Coordination normal.  Head: Normocephalic and atraumatic.  Eyes: Conjunctivae are normal. Pupils are equal, round, and reactive to light. No scleral icterus.  Neck: Normal range of motion. Neck supple. No tracheal deviation or thyromegaly present.  Cardiovascular: Normal rate, regular rhythm, normal heart sounds and intact distal pulses. Exam reveals no gallop and no friction rub. No murmur heard. Breast: relatively symmetric. Mild ptosis. No palpable masses. No skin dimpling. No nipple retraction or discharge. No LAD.  Respiratory: Effort normal. No respiratory distress. No chest wall tenderness. Breath sounds normal. No wheezes, rales or rhonchi.  GI: Soft. Bowel sounds are normal. The abdomen is soft and nontender. There is no rebound and no guarding.  Musculoskeletal: Normal range of motion. Extremities are nontender.  Lymphadenopathy: No cervical, preauricular, postauricular or axillary adenopathy is present Skin: Skin is warm and dry. No rash noted. No diaphoresis. No erythema.  No pallor. No clubbing, cyanosis, or edema.  Psychiatric: Normal mood and affect. Behavior is normal. Judgment and thought content normal.   Assessment and Plan:   Family history of cancer Genetic testing.   Malignant neoplasm of upper-outer quadrant of right breast in female, estrogen receptor positive (CMS-HCC) As long as genetics is negative, will plan seed localized lumpectomy with sentinel node biopsy for this new cT1cN0 right breast cancer.   I will place referrals for genetics, rad onc and med onc.   The surgical procedure was described to the patient. I discussed the incision type and location and that we would need radiology involved on with a wire or seed marker and/or sentinel node.   The risks and benefits of the procedure were described to the patient and she wishes to proceed.   We discussed the risks bleeding, infection, damage to other structures, need for further procedures/surgeries. We discussed the risk of seroma. The patient was advised if the area in the breast in cancer, we may need to go back to surgery for additional tissue to obtain negative margins or for a lymph node biopsy. The patient was advised that these are the most common complications, but that others can occur as well. They were advised against taking aspirin or other anti-inflammatory agents/blood thinners  the week before surgery.     Milus Height, MD FACS Surgical Oncology, General Surgery, Trauma and [facility] [place]

## 2021-09-09 ENCOUNTER — Ambulatory Visit
Admission: RE | Admit: 2021-09-09 | Discharge: 2021-09-09 | Disposition: A | Discharge status: Home / Self Care | Payer: No Typology Code available for payment source | Source: Ambulatory Visit | Attending: General Surgery | Admitting: General Surgery

## 2021-09-09 ENCOUNTER — Encounter: Payer: Self-pay | Admitting: *Deleted

## 2021-09-09 ENCOUNTER — Ambulatory Visit: Payer: Self-pay | Admitting: Genetic Counselor

## 2021-09-09 ENCOUNTER — Telehealth: Payer: Self-pay | Admitting: Genetic Counselor

## 2021-09-09 DIAGNOSIS — C50411 Malignant neoplasm of upper-outer quadrant of right female breast: Secondary | ICD-10-CM

## 2021-09-09 DIAGNOSIS — Z1379 Encounter for other screening for genetic and chromosomal anomalies: Secondary | ICD-10-CM

## 2021-09-09 DIAGNOSIS — Z170 Estrogen receptor positive status [ER+]: Secondary | ICD-10-CM

## 2021-09-09 DIAGNOSIS — C50111 Malignant neoplasm of central portion of right female breast: Secondary | ICD-10-CM

## 2021-09-09 NOTE — Progress Notes (Signed)
HPI:  Ms. Barhorst was previously seen in the [facility] clinic due to a personal and family history of cancer and concerns regarding a hereditary predisposition to cancer. Please refer to our prior cancer genetics clinic note for more information regarding our discussion, assessment and recommendations, at the time. Ms. Marlett recent genetic test results were disclosed to her, as were recommendations warranted by these results. These results and recommendations are discussed in more detail below.  CANCER HISTORY:  Oncology History Overview Note   Cancer Staging  Cancer of central portion of right breast [facility]) Staging form: Breast, AJCC 8th Edition - Clinical stage from 08/04/2021: Stage IA (cT1b, cN0, cM0, G1, ER+, PR+, HER2-) - Signed by Truitt Merle, MD on 09/01/2021    Cancer of central portion of right breast ([place])  07/29/2021 Mammogram   EXAM: DIGITAL DIAGNOSTIC UNILATERAL RIGHT MAMMOGRAM WITH TOMOSYNTHESIS AND CAD; ULTRASOUND RIGHT BREAST LIMITED  IMPRESSION: Suspicious mass in the right breast at 9 o'clock measuring 1.0 cm. Targeted ultrasound performed in the right axilla demonstrating normal lymph nodes.   08/04/2021 Initial Biopsy   Diagnosis Breast, right, needle core biopsy, 9 o'clock, ribbon shaped clip - INVASIVE DUCTAL CARCINOMA - DUCTAL CARCINOMA IN SITU - SEE COMMENT Microscopic Comment Based on the biopsy, the carcinoma appears Nottingham grade 2 of 3 and measures 0.9 cm in greatest linear extent.  PROGNOSTIC INDICATORS Results: The tumor cells are EQUIVOCAL for Her2 (2+). Her2 by FISH will be performed and results reported separately. Estrogen Receptor: 100%, POSITIVE, STRONG STAINING INTENSITY Progesterone Receptor: 95%, POSITIVE, STRONG STAINING INTENSITY Proliferation Marker Ki67: 10%  FLUORESCENCE IN-SITU HYBRIDIZATION Results: GROUP 5: HER2 **NEGATIVE**   09/01/2021 Initial Diagnosis   Cancer of central portion of right breast ([place])    09/08/2021 Genetic Testing   Negative genetic testing on the BRCAPlus panel.  The report date is 08/29/2021.  Negative genetic testing on the CancerNext-Expanded+RNAinsight.  DICER1 p.T43M VUS was identified. The report date is 09/08/2021.  The BRCAPlus gene panel offered by [facility] and includes sequencing and rearrangement analysis for the following 6 genes: BRCA1, BRCA2, CDH1, PALB2, PTEN, and TP53.  The CancerNext-Expanded gene panel offered by [facility] and includes sequencing and rearrangement analysis for the following 77 genes: AIP, ALK, APC*, ATM*, AXIN2, BAP1, BARD1, BLM, BMPR1A, BRCA1*, BRCA2*, BRIP1*, CDC73, CDH1*, CDK4, CDKN1B, CDKN2A, CHEK2*, CTNNA1, DICER1, FANCC, FH, FLCN, GALNT12, KIF1B, LZTR1, MAX, MEN1, MET, MLH1*, MSH2*, MSH3, MSH6*, MUTYH*, NBN, NF1*, NF2, NTHL1, PALB2*, PHOX2B, PMS2*, POT1, PRKAR1A, PTCH1, PTEN*, RAD51C*, RAD51D*, RB1, RECQL, RET, SDHA, SDHAF2, SDHB, SDHC, SDHD, SMAD4, SMARCA4, SMARCB1, SMARCE1, STK11, SUFU, TMEM127, TP53*, TSC1, TSC2, VHL and XRCC2 (sequencing and deletion/duplication); EGFR, EGLN1, HOXB13, KIT, MITF, PDGFRA, POLD1, and POLE (sequencing only); EPCAM and GREM1 (deletion/duplication only). DNA and RNA analyses performed for * genes.      FAMILY HISTORY:  We obtained a detailed, 4-generation family history.  Significant diagnoses are listed below: Family History  Problem Relation Age of Onset   Uterine cancer Mother 67   Diabetes Mother    Hypertension Mother    Thyroid disease Mother    Obesity Mother    Subarachnoid hemorrhage Father    Melanoma Father 67   Diabetes Father    Diabetes Sister    Obesity Sister    Diabetes Maternal Grandmother    Hypertension Maternal Grandmother    Melanoma Paternal Grandfather 64   Diabetes Paternal Grandfather    Breast cancer Other 70       MGMs sister  Breast cancer Other 72       MGF's sister   Breast cancer Other        dx < 19; PGMs sister    The patient has one son who is  cancer free.  She has a sister who is cancer free.  Both parents are living.   The patient's mother was diagnosed with uterine cancer at 50.  She had a brother and four sisters, who are all cancer free.  Both of her parents are deceased from non cancer related issues.  However her mother had two sisters with breast cancer in their 72's and her father had a sister with breast cancer at 53.   The patient's father was diagnosed with melanoma in his 80's.  He had one brother who is cancer free.  His parents are living.  His mother had a sister who died young with breast cancer.  His father had melanoma in his 61's.   Ms. Dierolf is unaware of previous family history of genetic testing for hereditary cancer risks. Patient's maternal ancestors are of Caucasian descent, and paternal ancestors are of Caucasian descent. There is no reported Ashkenazi Jewish ancestry. There is no known consanguinity  GENETIC TEST RESULTS: Genetic testing reported out on September 08, 2021 through the CancerNext-Expanded+RNAinsight cancer panel found no pathogenic mutations. The CancerNext-Expanded gene panel offered by [facility] and includes sequencing and rearrangement analysis for the following 77 genes: AIP, ALK, APC*, ATM*, AXIN2, BAP1, BARD1, BLM, BMPR1A, BRCA1*, BRCA2*, BRIP1*, CDC73, CDH1*, CDK4, CDKN1B, CDKN2A, CHEK2*, CTNNA1, DICER1, FANCC, FH, FLCN, GALNT12, KIF1B, LZTR1, MAX, MEN1, MET, MLH1*, MSH2*, MSH3, MSH6*, MUTYH*, NBN, NF1*, NF2, NTHL1, PALB2*, PHOX2B, PMS2*, POT1, PRKAR1A, PTCH1, PTEN*, RAD51C*, RAD51D*, RB1, RECQL, RET, SDHA, SDHAF2, SDHB, SDHC, SDHD, SMAD4, SMARCA4, SMARCB1, SMARCE1, STK11, SUFU, TMEM127, TP53*, TSC1, TSC2, VHL and XRCC2 (sequencing and deletion/duplication); EGFR, EGLN1, HOXB13, KIT, MITF, PDGFRA, POLD1, and POLE (sequencing only); EPCAM and GREM1 (deletion/duplication only). DNA and RNA analyses performed for * genes. The test report has been scanned into EPIC and is located under the  Molecular Pathology section of the Results Review tab.  A portion of the result report is included below for reference.     We discussed with Ms. Decarlo that because current genetic testing is not perfect, it is possible there may be a gene mutation in one of these genes that current testing cannot detect, but that chance is small.  We also discussed, that there could be another gene that has not yet been discovered, or that we have not yet tested, that is responsible for the cancer diagnoses in the family. It is also possible there is a hereditary cause for the cancer in the family that Ms. Revard did not inherit and therefore was not identified in her testing.  Therefore, it is important to remain in touch with cancer genetics in the future so that we can continue to offer Ms. Szymanowski the most up to date genetic testing.   Genetic testing did identify a variant of uncertain significance (VUS) was identified in the DICER1 gene called p.T43M.  At this time, it is unknown if this variant is associated with increased cancer risk or if this is a normal finding, but most variants such as this get reclassified to being inconsequential. It should not be used to make medical management decisions. With time, we suspect the lab will determine the significance of this variant, if any. If we do learn more about it, we will try to contact  Ms. Savell to discuss it further. However, it is important to stay in touch with [place] periodically and keep the address and phone number up to date.  ADDITIONAL GENETIC TESTING: We discussed with Ms. Lopiccolo that her genetic testing was fairly extensive.  If there are genes identified to increase cancer risk that can be analyzed in the future, we would be happy to discuss and coordinate this testing at that time.    CANCER SCREENING RECOMMENDATIONS: Ms. Giampietro test result is considered negative (normal).  This means that we have not identified a hereditary cause for her personal and family  history of cancer at this time. Most cancers happen by chance and this negative test suggests that her cancer may fall into this category.    While reassuring, this does not definitively rule out a hereditary predisposition to cancer. It is still possible that there could be genetic mutations that are undetectable by current technology. There could be genetic mutations in genes that have not been tested or identified to increase cancer risk.  Therefore, it is recommended she continue to follow the cancer management and screening guidelines provided by her oncology and primary healthcare provider.   An individual's cancer risk and medical management are not determined by genetic test results alone. Overall cancer risk assessment incorporates additional factors, including personal medical history, family history, and any available genetic information that may result in a personalized plan for cancer prevention and surveillance  RECOMMENDATIONS FOR FAMILY MEMBERS:  Individuals in this family might be at some increased risk of developing cancer, over the general population risk, simply due to the family history of cancer.  We recommended women in this family have a yearly mammogram beginning at age 66, or 66 years younger than the earliest onset of cancer, an annual clinical breast exam, and perform monthly breast self-exams. Women in this family should also have a gynecological exam as recommended by their primary provider. All family members should be referred for colonoscopy starting at age 60.  FOLLOW-UP: Lastly, we discussed with Ms. Hilburn that cancer genetics is a rapidly advancing field and it is possible that new genetic tests will be appropriate for her and/or her family members in the future. We encouraged her to remain in contact with cancer genetics on an annual basis so we can update her personal and family histories and let her know of advances in cancer genetics that may benefit this family.    Our contact number was provided. Ms. Whatley questions were answered to her satisfaction, and she knows she is welcome to call us at anytime with additional questions or concerns.   Roma Kayser, [place], [facility] Licensed, Certified Genetic Counselor [email or web address]

## 2021-09-09 NOTE — Telephone Encounter (Signed)
LM on VM that results are back and to please call. 

## 2021-09-09 NOTE — Progress Notes (Signed)
[place] CSW Psychosocial Distress Screening   Social Work was referred by distress screening protocol.  The patient scored a 7 on the Psychosocial Distress Thermometer which indicates moderate distress. Social Work Theatre manager contacted patient by phone to assess for distress and other psychosocial needs.   SW Intern left a voicemail offering support and encouraging patient to return call.   ONCBCN DISTRESS SCREENING 09/08/2021  Screening Type Initial Screening  Distress experienced in past week (1-10) 7  Emotional problem type Adjusting to illness    Rosary Lively, Social Work Intern Supervised by Gwinda Maine, LCSW

## 2021-09-09 NOTE — Telephone Encounter (Signed)
Revealed negative genetic testing.  Discussed that we do not know why she has breast cancer or why there is cancer in the family. It could be due to a different gene that we are not testing, or maybe our current technology may not be able to pick something up.  It will be important for her to keep in contact with genetics to keep up with whether additional testing may be needed.   One VUS in [place] was found.  This will not change medical management.

## 2021-09-10 ENCOUNTER — Encounter (HOSPITAL_COMMUNITY)
Admission: RE | Disposition: A | Discharge status: Home / Self Care | Payer: Self-pay | Source: Home / Self Care | Attending: General Surgery

## 2021-09-10 ENCOUNTER — Other Ambulatory Visit (HOSPITAL_COMMUNITY): Payer: Self-pay

## 2021-09-10 ENCOUNTER — Ambulatory Visit: Payer: No Typology Code available for payment source | Admitting: Psychology

## 2021-09-10 ENCOUNTER — Ambulatory Visit (HOSPITAL_COMMUNITY)
Admission: RE | Admit: 2021-09-10 | Discharge: 2021-09-10 | Disposition: A | Discharge status: Home / Self Care | Payer: No Typology Code available for payment source | Source: Ambulatory Visit | Attending: General Surgery | Admitting: General Surgery

## 2021-09-10 ENCOUNTER — Other Ambulatory Visit: Payer: Self-pay

## 2021-09-10 ENCOUNTER — Ambulatory Visit (HOSPITAL_COMMUNITY): Payer: No Typology Code available for payment source | Admitting: Anesthesiology

## 2021-09-10 ENCOUNTER — Ambulatory Visit
Admission: RE | Admit: 2021-09-10 | Discharge: 2021-09-10 | Disposition: A | Discharge status: Home / Self Care | Payer: No Typology Code available for payment source | Source: Ambulatory Visit | Attending: General Surgery | Admitting: General Surgery

## 2021-09-10 ENCOUNTER — Ambulatory Visit (HOSPITAL_COMMUNITY)
Admission: RE | Admit: 2021-09-10 | Discharge: 2021-09-10 | Disposition: A | Discharge status: Home / Self Care | Payer: No Typology Code available for payment source | Attending: General Surgery | Admitting: General Surgery

## 2021-09-10 ENCOUNTER — Encounter (HOSPITAL_COMMUNITY): Payer: Self-pay | Admitting: General Surgery

## 2021-09-10 ENCOUNTER — Ambulatory Visit (HOSPITAL_COMMUNITY): Payer: No Typology Code available for payment source | Admitting: Physician Assistant

## 2021-09-10 DIAGNOSIS — Z170 Estrogen receptor positive status [ER+]: Secondary | ICD-10-CM | POA: Diagnosis not present

## 2021-09-10 DIAGNOSIS — C50411 Malignant neoplasm of upper-outer quadrant of right female breast: Secondary | ICD-10-CM | POA: Insufficient documentation

## 2021-09-10 DIAGNOSIS — Z87891 Personal history of nicotine dependence: Secondary | ICD-10-CM | POA: Diagnosis not present

## 2021-09-10 DIAGNOSIS — Z803 Family history of malignant neoplasm of breast: Secondary | ICD-10-CM | POA: Insufficient documentation

## 2021-09-10 DIAGNOSIS — Z8049 Family history of malignant neoplasm of other genital organs: Secondary | ICD-10-CM | POA: Insufficient documentation

## 2021-09-10 HISTORY — PX: BREAST LUMPECTOMY: SHX2

## 2021-09-10 HISTORY — PX: BREAST LUMPECTOMY WITH RADIOACTIVE SEED AND SENTINEL LYMPH NODE BIOPSY: SHX6550

## 2021-09-10 SURGERY — BREAST LUMPECTOMY WITH RADIOACTIVE SEED AND SENTINEL LYMPH NODE BIOPSY
Anesthesia: General | Site: Breast | Laterality: Right

## 2021-09-10 MED ORDER — LIDOCAINE HCL 1 % IJ SOLN
INTRAMUSCULAR | Status: DC | PRN
  Administered 2021-09-10: 17 mL via SUBCUTANEOUS

## 2021-09-10 MED ORDER — AMISULPRIDE (ANTIEMETIC) 5 MG/2ML IV SOLN
INTRAVENOUS | Status: AC
  Filled 2021-09-10: qty 2

## 2021-09-10 MED ORDER — CHLORHEXIDINE GLUCONATE CLOTH 2 % EX PADS: 6.0000 | MEDICATED_PAD | Freq: Once | CUTANEOUS | Status: DC

## 2021-09-10 MED ORDER — ACETAMINOPHEN 500 MG PO TABS: 1000.0000 mg | ORAL_TABLET | ORAL | Status: AC

## 2021-09-10 MED ORDER — PROPOFOL 10 MG/ML IV BOLUS
INTRAVENOUS | Status: AC
  Filled 2021-09-10: qty 20

## 2021-09-10 MED ORDER — LACTATED RINGERS IV SOLN: INTRAVENOUS | Status: DC | PRN

## 2021-09-10 MED ORDER — CHLORHEXIDINE GLUCONATE 0.12 % MT SOLN
OROMUCOSAL | Status: AC
  Administered 2021-09-10: 15 mL via OROMUCOSAL
  Filled 2021-09-10: qty 15

## 2021-09-10 MED ORDER — TECHNETIUM TC 99M TILMANOCEPT KIT
1.0000 | PACK | Freq: Once | INTRAVENOUS | Status: AC | PRN
  Administered 2021-09-10: 1 via INTRADERMAL

## 2021-09-10 MED ORDER — FENTANYL CITRATE (PF) 250 MCG/5ML IJ SOLN
INTRAMUSCULAR | Status: DC | PRN
  Administered 2021-09-10 (×3): 25 ug via INTRAVENOUS
  Administered 2021-09-10: 50 ug via INTRAVENOUS
  Administered 2021-09-10: 25 ug via INTRAVENOUS

## 2021-09-10 MED ORDER — PHENYLEPHRINE 40 MCG/ML (10ML) SYRINGE FOR IV PUSH (FOR BLOOD PRESSURE SUPPORT)
PREFILLED_SYRINGE | INTRAVENOUS | Status: DC | PRN
  Administered 2021-09-10: 40 ug via INTRAVENOUS
  Administered 2021-09-10 (×2): 80 ug via INTRAVENOUS
  Administered 2021-09-10: 120 ug via INTRAVENOUS

## 2021-09-10 MED ORDER — MIDAZOLAM HCL 2 MG/2ML IJ SOLN
INTRAMUSCULAR | Status: AC
  Administered 2021-09-10: 2 mg via INTRAVENOUS
  Filled 2021-09-10: qty 2

## 2021-09-10 MED ORDER — CEFAZOLIN SODIUM-DEXTROSE 2-4 GM/100ML-% IV SOLN
INTRAVENOUS | Status: AC
  Filled 2021-09-10: qty 100

## 2021-09-10 MED ORDER — AMISULPRIDE (ANTIEMETIC) 5 MG/2ML IV SOLN
5.0000 mg | Freq: Once | INTRAVENOUS | Status: AC
  Administered 2021-09-10: 5 mg via INTRAVENOUS

## 2021-09-10 MED ORDER — DEXAMETHASONE SODIUM PHOSPHATE 10 MG/ML IJ SOLN
INTRAMUSCULAR | Status: DC | PRN
  Administered 2021-09-10: 10 mg via INTRAVENOUS

## 2021-09-10 MED ORDER — FENTANYL CITRATE (PF) 100 MCG/2ML IJ SOLN
INTRAMUSCULAR | Status: AC
  Administered 2021-09-10: 100 ug via INTRAVENOUS
  Filled 2021-09-10: qty 2

## 2021-09-10 MED ORDER — FENTANYL CITRATE (PF) 250 MCG/5ML IJ SOLN
INTRAMUSCULAR | Status: AC
  Filled 2021-09-10: qty 5

## 2021-09-10 MED ORDER — MIDAZOLAM HCL 2 MG/2ML IJ SOLN: 2.0000 mg | Freq: Once | INTRAMUSCULAR | Status: AC

## 2021-09-10 MED ORDER — CEFAZOLIN SODIUM-DEXTROSE 2-4 GM/100ML-% IV SOLN
2.0000 g | INTRAVENOUS | Status: AC
  Administered 2021-09-10: 2 g via INTRAVENOUS

## 2021-09-10 MED ORDER — LIDOCAINE 2% (20 MG/ML) 5 ML SYRINGE
INTRAMUSCULAR | Status: AC
  Filled 2021-09-10: qty 5

## 2021-09-10 MED ORDER — PROPOFOL 10 MG/ML IV BOLUS
INTRAVENOUS | Status: DC | PRN
  Administered 2021-09-10: 170 mg via INTRAVENOUS

## 2021-09-10 MED ORDER — FENTANYL CITRATE (PF) 100 MCG/2ML IJ SOLN: 100.0000 ug | Freq: Once | INTRAMUSCULAR | Status: AC

## 2021-09-10 MED ORDER — FENTANYL CITRATE (PF) 100 MCG/2ML IJ SOLN
INTRAMUSCULAR | Status: AC
  Filled 2021-09-10: qty 2

## 2021-09-10 MED ORDER — 0.9 % SODIUM CHLORIDE (POUR BTL) OPTIME
TOPICAL | Status: DC | PRN
  Administered 2021-09-10: 1000 mL

## 2021-09-10 MED ORDER — FENTANYL CITRATE (PF) 100 MCG/2ML IJ SOLN
25.0000 ug | INTRAMUSCULAR | Status: DC | PRN
  Administered 2021-09-10: 50 ug via INTRAVENOUS

## 2021-09-10 MED ORDER — OXYCODONE HCL 5 MG PO TABS
5.0000 mg | ORAL_TABLET | Freq: Four times a day (QID) | ORAL | 0 refills | Status: DC | PRN
  Filled 2021-09-10: qty 10, 3d supply, fill #0

## 2021-09-10 MED ORDER — BUPIVACAINE-EPINEPHRINE (PF) 0.25% -1:200000 IJ SOLN
INTRAMUSCULAR | Status: AC
  Filled 2021-09-10: qty 30

## 2021-09-10 MED ORDER — ENSURE PRE-SURGERY PO LIQD: 296.0000 mL | Freq: Once | ORAL | Status: DC

## 2021-09-10 MED ORDER — CHLORHEXIDINE GLUCONATE 0.12 % MT SOLN: 15.0000 mL | Freq: Once | OROMUCOSAL | Status: AC

## 2021-09-10 MED ORDER — ORAL CARE MOUTH RINSE: 15.0000 mL | Freq: Once | OROMUCOSAL | Status: AC

## 2021-09-10 MED ORDER — DEXAMETHASONE SODIUM PHOSPHATE 10 MG/ML IJ SOLN
INTRAMUSCULAR | Status: AC
  Filled 2021-09-10: qty 1

## 2021-09-10 MED ORDER — LIDOCAINE HCL (PF) 1 % IJ SOLN
INTRAMUSCULAR | Status: AC
  Filled 2021-09-10: qty 30

## 2021-09-10 MED ORDER — LIDOCAINE 2% (20 MG/ML) 5 ML SYRINGE
INTRAMUSCULAR | Status: DC | PRN
  Administered 2021-09-10: 100 mg via INTRAVENOUS

## 2021-09-10 MED ORDER — MAGTRACE LYMPHATIC TRACER
INTRAMUSCULAR | Status: DC | PRN
  Administered 2021-09-10: 2 mL via INTRAMUSCULAR

## 2021-09-10 MED ORDER — ONDANSETRON HCL 4 MG/2ML IJ SOLN
INTRAMUSCULAR | Status: DC | PRN
  Administered 2021-09-10: 4 mg via INTRAVENOUS

## 2021-09-10 MED ORDER — PHENYLEPHRINE 40 MCG/ML (10ML) SYRINGE FOR IV PUSH (FOR BLOOD PRESSURE SUPPORT)
PREFILLED_SYRINGE | INTRAVENOUS | Status: AC
  Filled 2021-09-10: qty 10

## 2021-09-10 MED ORDER — ACETAMINOPHEN 500 MG PO TABS
ORAL_TABLET | ORAL | Status: AC
  Administered 2021-09-10: 1000 mg via ORAL
  Filled 2021-09-10: qty 2

## 2021-09-10 MED ORDER — ONDANSETRON HCL 4 MG/2ML IJ SOLN
INTRAMUSCULAR | Status: AC
  Filled 2021-09-10: qty 2

## 2021-09-10 MED ORDER — LACTATED RINGERS IV SOLN: INTRAVENOUS | Status: DC

## 2021-09-10 SURGICAL SUPPLY — 38 items
BAG COUNTER SPONGE SURGICOUNT (BAG) ×2 IMPLANT
BINDER BREAST LRG (GAUZE/BANDAGES/DRESSINGS) ×2 IMPLANT
BNDG COHESIVE 4X5 TAN STRL (GAUZE/BANDAGES/DRESSINGS) ×2 IMPLANT
CANISTER SUCT 3000ML PPV (MISCELLANEOUS) ×2 IMPLANT
CHLORAPREP W/TINT 26 (MISCELLANEOUS) ×2 IMPLANT
CLIP VESOCCLUDE LG 6/CT (CLIP) ×2 IMPLANT
CLIP VESOCCLUDE MED 24/CT (CLIP) ×2 IMPLANT
CNTNR URN SCR LID CUP LEK RST (MISCELLANEOUS) ×5 IMPLANT
CONT SPEC 4OZ STRL OR WHT (MISCELLANEOUS) ×10
COVER PROBE W GEL 5X96 (DRAPES) ×4 IMPLANT
COVER SURGICAL LIGHT HANDLE (MISCELLANEOUS) ×2 IMPLANT
DERMABOND ADVANCED (GAUZE/BANDAGES/DRESSINGS) ×1
DERMABOND ADVANCED .7 DNX12 (GAUZE/BANDAGES/DRESSINGS) ×1 IMPLANT
DEVICE DUBIN SPECIMEN MAMMOGRA (MISCELLANEOUS) ×2 IMPLANT
ELECT NEEDLE BLADE 2-5/6 (NEEDLE) ×2 IMPLANT
ELECT REM PT RETURN 9FT ADLT (ELECTROSURGICAL) ×2
ELECTRODE REM PT RTRN 9FT ADLT (ELECTROSURGICAL) ×1 IMPLANT
GLOVE SURG ENC MOIS LTX SZ6 (GLOVE) ×2 IMPLANT
GLOVE SURG UNDER LTX SZ6.5 (GLOVE) ×2 IMPLANT
GOWN STRL REUS W/ TWL LRG LVL3 (GOWN DISPOSABLE) ×1 IMPLANT
GOWN STRL REUS W/TWL 2XL LVL3 (GOWN DISPOSABLE) ×2 IMPLANT
GOWN STRL REUS W/TWL LRG LVL3 (GOWN DISPOSABLE) ×2
KIT BASIN OR (CUSTOM PROCEDURE TRAY) ×2 IMPLANT
KIT MARKER MARGIN INK (KITS) ×2 IMPLANT
LIGHT WAVEGUIDE WIDE FLAT (MISCELLANEOUS) ×2 IMPLANT
NEEDLE FILTER BLUNT 18X 1/2SAF (NEEDLE) ×1
NEEDLE FILTER BLUNT 18X1 1/2 (NEEDLE) ×1 IMPLANT
NEEDLE HYPO 25GX1X1/2 BEV (NEEDLE) ×2 IMPLANT
NS IRRIG 1000ML POUR BTL (IV SOLUTION) ×2 IMPLANT
PACK GENERAL/GYN (CUSTOM PROCEDURE TRAY) ×2 IMPLANT
PACK UNIVERSAL I (CUSTOM PROCEDURE TRAY) ×2 IMPLANT
STOCKINETTE IMPERVIOUS 9X36 MD (GAUZE/BANDAGES/DRESSINGS) ×2 IMPLANT
SUT MNCRL AB 4-0 PS2 18 (SUTURE) ×2 IMPLANT
SUT VIC AB 3-0 SH 8-18 (SUTURE) ×2 IMPLANT
SYR CONTROL 10ML LL (SYRINGE) ×2 IMPLANT
TOWEL GREEN STERILE (TOWEL DISPOSABLE) ×2 IMPLANT
TOWEL GREEN STERILE FF (TOWEL DISPOSABLE) ×2 IMPLANT
TRACER MAGTRACE VIAL (MISCELLANEOUS) ×2 IMPLANT

## 2021-09-10 NOTE — Interval H&P Note (Signed)
History and Physical Interval Note:  09/10/2021 9:43 AM  Sheri Moore  has presented today for surgery, with the diagnosis of RIGHT BREAST CANCER.  The various methods of treatment have been discussed with the patient and family. After consideration of risks, benefits and other options for treatment, the patient has consented to  Procedure(s): RIGHT BREAST LUMPECTOMY WITH RADIOACTIVE SEED AND SENTINEL LYMPH NODE BIOPSY (Right) as a surgical intervention.  The patient's history has been reviewed, patient examined, no change in status, stable for surgery.  I have reviewed the patient's chart and labs.  Questions were answered to the patient's satisfaction.     Stark Klein

## 2021-09-10 NOTE — Op Note (Signed)
Right Breast Radioactive seed localized lumpectomy and sentinel lymph node biopsy  Indications: This patient presents with history of right breast cancer, upper outer quadrant, grade 2 invasive ductal carcinoma, +/+/-, Ki 67 10%.   Pre-operative Diagnosis: right breast cancer  Post-operative Diagnosis: Same  Surgeon: Stark Klein   Anesthesia: General endotracheal anesthesia  ASA Class: 2  Procedure Details  The patient was seen in the Holding Room. The risks, benefits, complications, treatment options, and expected outcomes were discussed with the patient. The possibilities of bleeding, infection, the need for additional procedures, failure to diagnose a condition, and creating a complication requiring transfusion or operation were discussed with the patient. The patient concurred with the proposed plan, giving informed consent.  The site of surgery properly noted/marked. The patient was taken to Operating Room # 2, identified, and the procedure verified as Right Breast Seed localized Lumpectomy with sentinel lymph node biopsy. A Time Out was held and the above information confirmed.  The right arm, breast, and chest were prepped and draped in standard fashion. The lumpectomy was performed by creating a transverse lateral incision near the previously placed radioactive seed.  Dissection was carried down to around the point of maximum signal intensity. The cautery was used to perform the dissection.  Hemostasis was achieved with cautery. The edges of the cavity were marked with large clips, with one each medial, lateral, inferior and superior, and two clips posteriorly.   The specimen was inked with the margin marker paint kit.    Specimen radiography confirmed inclusion of the mammographic lesion, the clip, and the seed.  The background signal in the breast was zero.  The wound was irrigated and closed with 3-0 vicryl in layers and 4-0 monocryl subcuticular suture.    Using a hand-held gamma  probe, Right axillary sentinel nodes were identified transcutaneously.  An oblique incision was created below the axillary hairline.  Dissection was carried through the clavipectoral fascia.  Five deep level 2 axillary sentinel nodes were removed.  Counts per second are below    The background count was 19 cps on the neoprobe and 45 on the Sentimag probe.  The wound was irrigated.  Hemostasis was achieved with cautery.  The axillary incision was closed with a 3-0 vicryl deep dermal interrupted sutures and a 4-0 monocryl subcuticular closure.    Sterile dressings were applied. At the end of the operation, all sponge, instrument, and needle counts were correct.  Findings: grossly clear surgical margins and no adenopathy, anterior margin is skin SLN #1, 3, and 4 were hot with both probes, #2 and 5 were only hot with the Neoprobe. Highest CPS on MagTrace was around 4500 and highest cps on the neoprobe was around 650.    Estimated Blood Loss:  min         Specimens: right breast tissue with seed and five axillary sentinel lymph nodes.             Complications:  None; patient tolerated the procedure well.         Disposition: PACU - hemodynamically stable.         Condition: stable

## 2021-09-10 NOTE — Discharge Instructions (Addendum)
[facility] Office Phone Number [phone]  BREAST BIOPSY/ PARTIAL MASTECTOMY: POST OP INSTRUCTIONS  Always review your discharge instruction sheet given to you by the facility where your surgery was performed.  IF YOU HAVE DISABILITY OR FAMILY LEAVE FORMS, YOU MUST BRING THEM TO THE OFFICE FOR PROCESSING.  DO NOT GIVE THEM TO YOUR DOCTOR.  A prescription for pain medication may be given to you upon discharge.  Take your pain medication as prescribed, if needed.  If narcotic pain medicine is not needed, then you may take acetaminophen (Tylenol) or ibuprofen (Advil) as needed. Take your usually prescribed medications unless otherwise directed If you need a refill on your pain medication, please contact your pharmacy.  They will contact our office to request authorization.  Prescriptions will not be filled after 5pm or on week-ends. You should eat very light the first 24 hours after surgery, such as soup, crackers, pudding, etc.  Resume your normal diet the day after surgery. Most patients will experience some swelling and bruising in the breast.  Ice packs and a good support bra will help.  Swelling and bruising can take several days to resolve.  It is common to experience some constipation if taking pain medication after surgery.  Increasing fluid intake and taking a stool softener will usually help or prevent this problem from occurring.  A mild laxative (Milk of Magnesia or Miralax) should be taken according to package directions if there are no bowel movements after 48 hours. Unless discharge instructions indicate otherwise, you may remove your bandages 48 hours after surgery, and you may shower at that time.  You may have steri-strips (small skin tapes) in place directly over the incision.  These strips should be left on the skin for 7-10 days.   Any sutures or staples will be removed at the office during your follow-up visit. ACTIVITIES:  You may resume regular daily activities  (gradually increasing) beginning the next day.  Wearing a good support bra or sports bra (or the breast binder) minimizes pain and swelling.  You may have sexual intercourse when it is comfortable. You may drive when you no longer are taking prescription pain medication, you can comfortably wear a seatbelt, and you can safely maneuver your car and apply brakes. RETURN TO WORK:  __________1 week_______________ You should see your doctor in the office for a follow-up appointment approximately two weeks after your surgery.  Your doctor's nurse will typically make your follow-up appointment when she calls you with your pathology report.  Expect your pathology report 2-3 business days after your surgery.  You may call to check if you do not hear from us after three days.   WHEN TO CALL YOUR DOCTOR: Fever over 101.0 Nausea and/or vomiting. Extreme swelling or bruising. Continued bleeding from incision. Increased pain, redness, or drainage from the incision.  The clinic staff is available to answer your questions during regular business hours.  Please don't hesitate to call and ask to speak to one of the nurses for clinical concerns.  If you have a medical emergency, go to the nearest emergency room or call 911.  A surgeon from [facility] is always on call at the hospital.  For further questions, please visit [email or web address]   

## 2021-09-10 NOTE — Transfer of Care (Signed)
Immediate Anesthesia Transfer of Care Note  Patient: Sheri Moore  Procedure(s) Performed: RIGHT BREAST LUMPECTOMY WITH RADIOACTIVE SEED AND SENTINEL LYMPH NODE BIOPSY (Right: Breast)  Patient Location: PACU  Anesthesia Type:General and Regional  Level of Consciousness: awake, alert  and oriented  Airway & Oxygen Therapy: Patient Spontanous Breathing  Post-op Assessment: Report given to RN, Post -op Vital signs reviewed and stable and Patient moving all extremities X 4  Post vital signs: Reviewed and stable  Last Vitals:  Vitals Value Taken Time  BP 147/97 09/10/21 1340  Temp    Pulse 76 09/10/21 1343  Resp 12 09/10/21 1343  SpO2 99 % 09/10/21 1343  Vitals shown include unvalidated device data.  Last Pain:  Vitals:   09/10/21 0957  TempSrc:   PainSc: 0-No pain         Complications: No notable events documented.

## 2021-09-10 NOTE — Anesthesia Preprocedure Evaluation (Addendum)
Anesthesia Evaluation  Patient identified by MRN, date of birth, ID band Patient awake    Reviewed: Allergy & Precautions, NPO status , Patient's Chart, lab work & pertinent test results  Airway Mallampati: II  TM Distance: >3 FB     Dental   Pulmonary neg pulmonary ROS, former smoker,    breath sounds clear to auscultation       Cardiovascular negative cardio ROS   Rhythm:Regular Rate:Normal     Neuro/Psych    GI/Hepatic Neg liver ROS,   Endo/Other  negative endocrine ROS  Renal/GU negative Renal ROS     Musculoskeletal   Abdominal   Peds  Hematology   Anesthesia Other Findings   Reproductive/Obstetrics                             Anesthesia Physical Anesthesia Plan  ASA: 2  Anesthesia Plan: General   Post-op Pain Management: Regional block   Induction: Intravenous  PONV Risk Score and Plan: 3 and Ondansetron and Midazolam  Airway Management Planned: LMA  Additional Equipment:   Intra-op Plan:   Post-operative Plan: Extubation in OR  Informed Consent:   Plan Discussed with: CRNA and Anesthesiologist  Anesthesia Plan Comments:        Anesthesia Quick Evaluation

## 2021-09-10 NOTE — Anesthesia Procedure Notes (Signed)
Procedure Name: LMA Insertion Date/Time: 09/10/2021 12:03 PM Performed by: Harden Mo, CRNA Pre-anesthesia Checklist: Patient identified, Emergency Drugs available, Suction available and Patient being monitored Patient Re-evaluated:Patient Re-evaluated prior to induction Oxygen Delivery Method: Circle System Utilized Preoxygenation: Pre-oxygenation with 100% oxygen Induction Type: IV induction LMA: LMA inserted LMA Size: 4.0 Number of attempts: 1 Airway Equipment and Method: Bite block Placement Confirmation: positive ETCO2 Tube secured with: Tape Dental Injury: Teeth and Oropharynx as per pre-operative assessment

## 2021-09-10 NOTE — Anesthesia Procedure Notes (Signed)
Anesthesia Regional Block: Pectoralis block   Pre-Anesthetic Checklist: , timeout performed,  Correct Patient, Correct Site, Correct Laterality,  Correct Procedure, Correct Position, site marked,  Risks and benefits discussed,  Surgical consent,  Pre-op evaluation,  At surgeon's request and post-op pain management  Laterality: Right  Prep: chloraprep       Needles:  Injection technique: Single-shot      Additional Needles:   Procedures: Doppler guided,,,, ultrasound used (permanent image in chart),,    Narrative:  Start time: 09/10/2021 11:05 AM End time: 09/10/2021 11:25 AM Injection made incrementally with aspirations every 5 mL.  Performed by: Personally  Anesthesiologist: Belinda Block, MD

## 2021-09-10 NOTE — Anesthesia Postprocedure Evaluation (Signed)
Anesthesia Post Note  Patient: Sheri Moore  Procedure(s) Performed: RIGHT BREAST LUMPECTOMY WITH RADIOACTIVE SEED AND SENTINEL LYMPH NODE BIOPSY (Right: Breast)     Patient location during evaluation: PACU Anesthesia Type: General Level of consciousness: awake Pain management: pain level controlled Vital Signs Assessment: post-procedure vital signs reviewed and stable Respiratory status: spontaneous breathing Cardiovascular status: stable Postop Assessment: no apparent nausea or vomiting Anesthetic complications: no   No notable events documented.  Last Vitals:  Vitals:   09/10/21 1355 09/10/21 1410  BP: 125/90 127/83  Pulse: 80 88  Resp: 10 19  Temp:  (!) 36.1 C  SpO2: 98% 100%    Last Pain:  Vitals:   09/10/21 1410  TempSrc:   PainSc: Asleep                 Manreet Kiernan

## 2021-09-11 ENCOUNTER — Encounter (HOSPITAL_COMMUNITY): Payer: Self-pay | Admitting: General Surgery

## 2021-09-11 ENCOUNTER — Encounter: Payer: Self-pay | Admitting: *Deleted

## 2021-09-11 NOTE — Progress Notes (Signed)
[facility] Psychosocial Distress Screening Clinical Social Work  Clinical Social Work was referred by distress screening protocol.  The patient scored a 7 on the Psychosocial Distress Thermometer which indicates moderate distress. Clinical Social Worker  attempted to contact patient  to assess for distress and other psychosocial needs.   ONCBCN DISTRESS SCREENING 09/08/2021  Screening Type Initial Screening  Distress experienced in past week (1-10) 7  Emotional problem type Adjusting to illness    Clinical Social Worker follow up needed: Yes.    If yes, follow up plan: CSW has made 2 attempts to contact patient. CSW left voicemail requesting patient return call when convenient.  Gwinda Maine, LCSW

## 2021-09-14 LAB — SURGICAL PATHOLOGY

## 2021-09-15 ENCOUNTER — Encounter: Payer: No Typology Code available for payment source | Admitting: Psychology

## 2021-09-17 ENCOUNTER — Encounter: Payer: Self-pay | Admitting: *Deleted

## 2021-09-17 ENCOUNTER — Telehealth: Payer: Self-pay | Admitting: *Deleted

## 2021-09-17 NOTE — Telephone Encounter (Signed)
Ordered oncoytpe per Dr. Burr Medico. Faxed requisition to pathology

## 2021-09-21 ENCOUNTER — Encounter: Payer: No Typology Code available for payment source | Attending: Psychology | Admitting: Psychology

## 2021-09-21 ENCOUNTER — Encounter: Payer: No Typology Code available for payment source | Admitting: Psychology

## 2021-09-21 DIAGNOSIS — R41840 Attention and concentration deficit: Secondary | ICD-10-CM | POA: Diagnosis not present

## 2021-09-21 DIAGNOSIS — R4189 Other symptoms and signs involving cognitive functions and awareness: Secondary | ICD-10-CM

## 2021-09-21 DIAGNOSIS — R9082 White matter disease, unspecified: Secondary | ICD-10-CM

## 2021-09-21 DIAGNOSIS — F418 Other specified anxiety disorders: Secondary | ICD-10-CM | POA: Diagnosis not present

## 2021-09-22 NOTE — Progress Notes (Signed)
Neuropsychological Consultation   Patient:   Sheri Moore   DOB:   July 27, 1981  MR Number:  [id number]  Location:  [facility] [place], [place] [id number] [place] [id number] Dept: [phone]           Date of Service:   09/02/2021  Start Time:   10 AM End Time:   11 AM  TELEHEALTH NOTE  Due to national recommendations of social distancing due to COVID 19, an audio/video telehealth visit is felt to be most appropriate for this patient at this time.  See Chart message from today for the patient's consent to telehealth from [place].     I verified that I am speaking with the correct person using two identifiers.  Location of patient: Her home Location of provider: Physicians Home due to viral illness Method of communication: MyChart telehealth visit Names of participants : April scheduling, April obtaining consent and vitals if available Established patient Time spent on call: 30 minutes  30 minutes was spent with the patient for a scheduled 1 hour appointment.  After roughly 30 minutes there was a significant loss of connection and the patient did not reappear for the rest of the telehealth visit.   Provider/Observer:  Ilean Skill, Psy.D.       Clinical Neuropsychologist       Billing Code/Service: [id number] Reason for Service:  KELSAY HAGGARD is a 40 year old female referred for neuropsychological consultation by Rochel Brome, MD who is the patient's PCP.  The patient is also been followed by Dr. Felecia Shelling who is her treating neurologist due to these neurobehavioral issues secondary to white matter and subcortical changes.  The patient has been diagnosed with white matter changes/disease of unknown etiological factors although MS has been considered and potentially ruled out and currently is viewed is most consistent with small  vessel disease/microvascular ischemic changes.  The patient is describing significant difficulties with retrieval type memory deficits, problems with attention and concentration, slowed information processing, difficulties with problem-solving executive functioning and vertigo.  Patient also has had some emotional features including depressive and anxiety based symptomatology.  The patient has been followed by Arlice Colt, MD with [facility] since at least 2020.  Initially, the patient began experiencing vertigo and headaches around 2004.  Brain MRI performed showed white matter abnormalities and the patient was followed initially by Dr. Jannifer Franklin for neurological consultation.  LP was performed and cerebrospinal fluid was interpreted as normal in 2004.  She did have some difficulties with her spinal tap.  The patient was followed by Dr. Dellis Filbert with [facility] between 2006 in 2009 and had a couple of MRI scans performed.  Besides the vertigo patient also reported and continues to report cognitive changes including reduced focus and attention and concentration difficulties with consideration of MS as the etiological factors.  In 2019 she had a reoccurrence of symptoms including the onset of lip tingling bilaterally as well as sensation changes on her face and left arm and hand tingling.  This sensation was intermittent.  Patient is also been describing for the past couple years her arms feeling very heavy without tingling.  Fatigue is persisted for many years and is exacerbated by heat and will feel too tired to do other activities when she gets fatigued.  The patient describes adequate sleep patterns but utilizes Ambien to be able to sleep.  She has  been using Ambien for several years now.  She has been tried on various psychotropic medications including Trintellix and Cymbalta.  Patient has described occasional headaches that were mostly mild to moderate and helped by Tylenol.  She denies  them typically associated with nausea, photophobia or phonophobia and it is unclear whether they are migrainous in nature.  Patient has had a normal sleep study in the past.  More recently, the patient has reported increased feelings of heaviness in her arms and occasional tingling in her left arm and third and fourth fingers.  Gait and balance are described as being fine.  These are similar to symptoms she experienced in 2020 that had improved before reoccurring.  Patient continues to describe cognitive difficulties and has been tried on various stimulant type medications and is currently taking Adderall.  The patient has had multiple MRIs and while initial reports stated probable MS Dr. Felecia Shelling feels like the minimal changes over the past 15 years and the images are more consistent with chronic microvascular ischemic changes rather than MS.  Recent labs conducted in July of this year including ANA, B12, vitamin D, TSH, CMP, CBC all were normal and felt to be noncontributory.  During the clinical interview the patient describes her typical headaches and vertigo but denies any photophobia or phonophobia and rare nausea symptoms.  She describes any visual aura type symptoms or other cognitive changes that immediately preceded her headaches.  She does report that during a bad headache that she experiences increase in her cognitive difficulties.  The patient describes her memory issues to include situations like clicking on her browser to go to website and then not remembering why she was going online to look something up.  The patient reports that she has to keep her medications in a pillbox because she will take her medications and then remember going to take her medications again and not remembering she had already taken them or not.  Patient will forget whether or not she locked her doors and have to go back and look.  Patient reports that she will take clothes out of the dryer knowing that she has to put more  close into the dryer from the washer and then leave to fold her close and forget to come back to [place] in the dryer.  The patient reports that her husband or son will ask her simple questions and she has very delayed response and that she has to think about it.  Patient has difficulty keeping up when she is cooking food and times where she will go to work and have no memory of whether or not she took her son to school or other activities going to work.  The patient denies any history of concussive events or motor vehicle accidents and no loss of consciousness in the past.  There is no family history of neurological conditions except her father recently having a subarachnoid hemorrhage in the past year.  He has recovered well.  The patient reports that she has to take Ambien at night to sleep and will take her Ambien around 9 or 930 and is in bed by 10 or 1030 and wakes up at 6:45 AM.  She describes her appetite is good and has 3 regular meals each day.  The patient reports that the primary reason for this referral was to see if there was something she could do to help with her symptoms as they are not being able to find particular etiological factors and other than  the Adderall during the day and Ambien at night she is not having any other specific interventions to help.  Behavioral Observation: MADICYN MESINA  presents as a 40 y.o.-year-old Right handed Caucasian Female who appeared her stated age. her dress was Appropriate and she was Well Groomed and her manners were Appropriate to the situation.  her participation was indicative of Appropriate behaviors.  There were physical disabilities noted.  she displayed an appropriate level of cooperation and motivation.     Interactions:    Active Appropriate  Attention:   abnormal and attention span appeared shorter than expected for age  Memory:   abnormal; remote memory intact, recent memory impaired  Visuo-spatial:  not examined  Speech  (Volume):  normal  Speech:   normal; normal  Thought Process:  Coherent and Relevant  Though Content:  WNL; not suicidal and not homicidal  Orientation:   person, place, time/date, and situation  Judgment:   Good  Planning:   Fair  Affect:    Anxious  Mood:    Dysphoric  Insight:   Good  Intelligence:   normal  Marital Status/Living: The patient was born and raised in [place] in [place] along with 1 sister.  She is married and has been married for 63 years and they have a 60 year old son.  There are no major stressors or significant past medical history other than what is going on with her changes on MRI.  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  She has been working as a Marine scientist for the past 13 years.  Hobbies and interest include camping, spending time with her pets and family and baseball.  Substance Use:  No concerns of substance abuse are reported.  Patient denies any substance use or abuse.  Education:   College  Medical History:   Past Medical History:  Diagnosis Date   Anxiety    Breast cancer ([place]) 07/06/21   Right Breast, first seen on mammogram done 07/06/21   Depression    Family history of breast cancer    Family history of melanoma    Family history of uterine cancer    Fibroid    Gastroschisis          Patient Active Problem List   Diagnosis Date Noted   Cancer of central portion of right breast ([place]) 09/01/2021   Genetic testing 08/31/2021   Family history of breast cancer 08/19/2021   Family history of melanoma 08/19/2021   Family history of uterine cancer 08/19/2021   Cognitive changes 04/29/2021   Attention deficit disorder (ADD) without hyperactivity 01/09/2020   GAD (generalized anxiety disorder) 01/09/2020   Somnolence, daytime 01/09/2020   Facial numbness  12/12/2018   White matter abnormality on MRI of brain 11/09/2018   Other fatigue 11/09/2018   Depression with anxiety 11/09/2018   Left arm numbness 10/17/2018   Left arm weakness 10/17/2018   Sprain of anterior talofibular ligament of right ankle 05/08/2017       Psychiatric History:  No prior psychiatric history other than some issues with anxiety.  Family Med/Psych History:  Family History  Problem Relation Age of Onset   Uterine cancer Mother 100   Diabetes Mother    Hypertension Mother    Thyroid disease Mother    Obesity Mother    Subarachnoid hemorrhage  Father    Melanoma Father 68   Diabetes Father    Diabetes Sister    Obesity Sister    Diabetes Maternal Grandmother    Hypertension Maternal Grandmother    Melanoma Paternal Grandfather 59   Diabetes Paternal Grandfather    Breast cancer Other 6       MGMs sister   Breast cancer Other 79       MGF's sister   Breast cancer Other        dx < 28; PGMs sister    Impression/DX:  KERRIANN KAMPHUIS is a 40 year old female referred for neuropsychological consultation by Rochel Brome, MD who is the patient's PCP.  The patient is also been followed by Dr. Felecia Shelling who is her treating neurologist due to these neurobehavioral issues secondary to white matter and subcortical changes.  The patient has been diagnosed with white matter changes/disease of unknown etiological factors although MS has been considered and potentially ruled out and currently is viewed is most consistent with small vessel disease/microvascular ischemic changes.  The patient is describing significant difficulties with retrieval type memory deficits, problems with attention and concentration, slowed information processing, difficulties with problem-solving executive functioning and vertigo.  Patient also has had some emotional features including depressive and anxiety based symptomatology.  Disposition/Plan:  Today, we had a telehealth visit that was necessitated by  my pulm viral illness and wanting to avoid any risk of passing along to the patient for the office staff.  The patient reports that she is still struggling with her memory and expressive language changes with specific questions about strategies for coping and adjusting to these difficulties.  We addressed some of the issues around medications and reviewed issues of why we suspect she is having these cognitive difficulties.  Significant discussion was made as to strategies for adjusting and adapting to the attention and memory issues as well as expressive language issues.  However, after approximately 30 minutes the connection was lost and was unable to be regained.  There is a follow-up appointment scheduled for 09/28/2021.  Diagnosis:    White matter abnormality on MRI of brain  Cognitive changes  Attention and concentration deficit  Depression with anxiety         Electronically Signed   _______________________ Ilean Skill, Psy.D. Clinical Neuropsychologist

## 2021-09-28 ENCOUNTER — Telehealth: Payer: Self-pay | Admitting: *Deleted

## 2021-09-28 ENCOUNTER — Encounter: Payer: No Typology Code available for payment source | Admitting: Psychology

## 2021-09-28 ENCOUNTER — Encounter: Payer: Self-pay | Admitting: *Deleted

## 2021-09-28 ENCOUNTER — Encounter (HOSPITAL_COMMUNITY): Payer: Self-pay

## 2021-09-28 NOTE — Telephone Encounter (Signed)
Received oncotype results of 22/8%.  Patient confirmed appt for 12/22 at 1pm to discuss results.

## 2021-09-30 ENCOUNTER — Encounter: Payer: Self-pay | Admitting: Hematology

## 2021-10-01 ENCOUNTER — Other Ambulatory Visit: Payer: Self-pay | Admitting: Family Medicine

## 2021-10-01 ENCOUNTER — Encounter: Payer: Self-pay | Admitting: Hematology

## 2021-10-01 ENCOUNTER — Inpatient Hospital Stay: Payer: No Typology Code available for payment source | Attending: Genetic Counselor | Admitting: Hematology

## 2021-10-01 ENCOUNTER — Other Ambulatory Visit (HOSPITAL_COMMUNITY): Payer: Self-pay

## 2021-10-01 ENCOUNTER — Other Ambulatory Visit: Payer: Self-pay

## 2021-10-01 VITALS — BP 118/81 | HR 101 | Temp 98.0°F | Resp 16 | Ht 65.0 in | Wt 157.7 lb

## 2021-10-01 DIAGNOSIS — F900 Attention-deficit hyperactivity disorder, predominantly inattentive type: Secondary | ICD-10-CM

## 2021-10-01 DIAGNOSIS — C50111 Malignant neoplasm of central portion of right female breast: Secondary | ICD-10-CM | POA: Insufficient documentation

## 2021-10-01 DIAGNOSIS — Z170 Estrogen receptor positive status [ER+]: Secondary | ICD-10-CM | POA: Diagnosis not present

## 2021-10-01 MED ORDER — AMPHETAMINE-DEXTROAMPHET ER 25 MG PO CP24
ORAL_CAPSULE | Freq: Every morning | ORAL | 0 refills | Status: DC
  Filled 2021-10-01: qty 30, 30d supply, fill #0

## 2021-10-01 NOTE — Progress Notes (Signed)
[place]   [phone] [phone]   Clinic Follow up Note   Patient Care Team: Rochel Brome, MD as PCP - General (Family Medicine) Rockwell Germany, RN as Oncology Nurse Navigator Mauro Kaufmann, RN as Oncology Nurse Navigator Truitt Merle, MD as Consulting Physician (Hematology)  Date of Service:  10/01/2021  CHIEF COMPLAINT: f/u of right breast cancer  CURRENT THERAPY:  PENDING Adjuvant radiation  ASSESSMENT & PLAN:  Sheri Moore is a 40 y.o. female with   1. Cancer of central portion of right breast, Stage IA, p(T1c, N0), ER+/PR+/HER2-, Grade 2  -found on screening mammogram. Right diagnostic MM and [place] 07/29/21 showed 1 cm mass at 9 o'clock. 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 -right lumpectomy on 09/10/21 under Dr. Barry Dienes showed 1.2 cm IDC, grade 3, with small foci of DCIS. -Oncotype DX was obtained on the final surgical sample and the recurrence score of 22 predicts a risk of recurrence outside the breast over the next 9 years of 8%, if the patient's only systemic therapy is an antiestrogen for 5 years.   -I discussed the recurrence score is in the low risk range, based on the [place] trial data.however to trial also showed some benefit, especially reduced risk of recurrence, in a young woman (age less than 64 ) when RS is between 77 and 83.  However due to her postmenopausal status, both ER and PR strongly positive, and low Ki-67, I think the benefit of adjuvant chemotherapy is very low case, and I do not strongly recommend adjuvant chemotherapy.  However if she wants to be as aggressive as possible, then she should proceed with chemo; which I will recommen docetaxel and cyclophosphamide every 3 weeks for 4 cycles.   -After a lengthy discussion, patient decided not to pursue adjuvant chemotherapy, and I am comfortable with her decision also. -She will proceed with adjuvant radiation. -Given the strong ER and PR expression in her  postmenopausal status, I recommend adjuvant endocrine therapy with aromatase inhibitor for a total of 7-10 years to reduce the risk of cancer recurrence. Potential benefits and side effects were discussed with patient and she is interested. -She will f/u with radiation oncologist Dr. Lisbeth Renshaw on 10/15/21. She will benefit from breast radiation if she undergo lumpectomy to decrease the risk of breast cancer.  -We also discussed the breast cancer surveillance after her surgery. She will continue annual screening mammogram, self exam, and a routine office visit with lab and exam with [place]. -I encouraged her to have healthy diet and exercise regularly.    2. Bone Health  -She has never had a DEXA. This is scheduled for 02/12/22.   3. Genetic Testing -she had genetic counseling on 08/19/21. Results are negative with VUS in [place].     PLAN:  -f/u with Dr. Barry Dienes 10/13/21 -we decided not to pursue adjuvant chemotherapy -re-consult with Dr. Lisbeth Renshaw on 10/15/21 -f/u the end of radiation   No problem-specific Assessment & Plan notes found for this encounter.   SUMMARY OF ONCOLOGIC HISTORY: Oncology History Overview Note   Cancer Staging  Cancer of central portion of right breast [facility]) Staging form: Breast, AJCC 8th Edition - Clinical stage from 08/04/2021: Stage IA (cT1b, cN0, cM0, G2, ER+, PR+, HER2-) - Unsigned - Pathologic stage from 09/10/2021: Stage IA (pT1c, pN0, cM0, G2, ER+, PR+, HER2-, Oncotype DX score: 22) - Signed by Truitt Merle, MD on 10/01/2021     Cancer of central portion of right breast (  [place])  07/29/2021 Mammogram   EXAM: DIGITAL DIAGNOSTIC UNILATERAL RIGHT MAMMOGRAM WITH TOMOSYNTHESIS AND CAD; ULTRASOUND RIGHT BREAST LIMITED  IMPRESSION: Suspicious mass in the right breast at 9 o'clock measuring 1.0 cm. Targeted ultrasound performed in the right axilla demonstrating normal lymph nodes.   08/04/2021 Initial Biopsy   Diagnosis Breast, right, needle core biopsy, 9 o'clock, ribbon  shaped clip - INVASIVE DUCTAL CARCINOMA - DUCTAL CARCINOMA IN SITU - SEE COMMENT Microscopic Comment Based on the biopsy, the carcinoma appears Nottingham grade 2 of 3 and measures 0.9 cm in greatest linear extent.  PROGNOSTIC INDICATORS Results: The tumor cells are EQUIVOCAL for Her2 (2+). Her2 by FISH will be performed and results reported separately. Estrogen Receptor: 100%, POSITIVE, STRONG STAINING INTENSITY Progesterone Receptor: 95%, POSITIVE, STRONG STAINING INTENSITY Proliferation Marker Ki67: 10%  FLUORESCENCE IN-SITU HYBRIDIZATION Results: GROUP 5: HER2 **NEGATIVE**   09/01/2021 Initial Diagnosis   Cancer of central portion of right breast ([place])   09/08/2021 Genetic Testing   Negative genetic testing on the BRCAPlus panel.  The report date is 08/29/2021.  Negative genetic testing on the CancerNext-Expanded+RNAinsight.  DICER1 p.T43M VUS was identified. The report date is 09/08/2021.  The BRCAPlus gene panel offered by [facility] and includes sequencing and rearrangement analysis for the following 6 genes: BRCA1, BRCA2, CDH1, PALB2, PTEN, and TP53.  The CancerNext-Expanded gene panel offered by [facility] and includes sequencing and rearrangement analysis for the following 77 genes: AIP, ALK, APC*, ATM*, AXIN2, BAP1, BARD1, BLM, BMPR1A, BRCA1*, BRCA2*, BRIP1*, CDC73, CDH1*, CDK4, CDKN1B, CDKN2A, CHEK2*, CTNNA1, DICER1, FANCC, FH, FLCN, GALNT12, KIF1B, LZTR1, MAX, MEN1, MET, MLH1*, MSH2*, MSH3, MSH6*, MUTYH*, NBN, NF1*, NF2, NTHL1, PALB2*, PHOX2B, PMS2*, POT1, PRKAR1A, PTCH1, PTEN*, RAD51C*, RAD51D*, RB1, RECQL, RET, SDHA, SDHAF2, SDHB, SDHC, SDHD, SMAD4, SMARCA4, SMARCB1, SMARCE1, STK11, SUFU, TMEM127, TP53*, TSC1, TSC2, VHL and XRCC2 (sequencing and deletion/duplication); EGFR, EGLN1, HOXB13, KIT, MITF, PDGFRA, POLD1, and POLE (sequencing only); EPCAM and GREM1 (deletion/duplication only). DNA and RNA analyses performed for * genes.    09/10/2021 Cancer Staging   Staging  form: Breast, AJCC 8th Edition - Pathologic stage from 09/10/2021: Stage IA (pT1c, pN0, cM0, G2, ER+, PR+, HER2-, Oncotype DX score: 22) - Signed by Truitt Merle, MD on 10/01/2021 Stage prefix: Initial diagnosis Multigene prognostic tests performed: Oncotype DX Recurrence score range: Greater than or equal to 11 Histologic grading system: 3 grade system Residual tumor (R): R0 - None    09/10/2021 Definitive Surgery   FINAL MICROSCOPIC DIAGNOSIS:  A. BREAST, RIGHT, LUMPECTOMY:  Invasive ductal carcinoma with clear margins of resection.  INVASIVE CARCINOMA OF THE BREAST:  Resection  Procedure: Lumpectomy.  Specimen Laterality: Right.  Histologic Type: Invasive ductal carcinoma.  Histologic Grade:       Glandular (Acinar)/Tubular Differentiation: 3 out of possible 3.       Nuclear Pleomorphism: 2 out of possible 3.       Mitotic Rate: 1 out of possible 3.       Overall Grade: 2 out of possible 3 (from a score of 6 out of  possible 9)  Tumor Size: 1.2 cm. x 1.0 cm. x 0.8 cm.  Ductal Carcinoma In Situ: Present. Small foci without comedo necrosis.  Tumor Extent: Limited to breast parenchyma.  Treatment Effect in the Breast: No known presurgical therapy  Margins: All margins are negative for invasive carcinoma.       Distance from Closest Margin (mm): 7 mm.       Specify Closest Margin (required only if <77m):  Inferior.  DCIS Margins: Uninvolved by DCIS.       Distance from Closest Margin (mm): 6 mm.       Specify Closest Margin (required only if <37m): Posterior.  Regional Lymph Nodes:       Number of Lymph Nodes Examined: Five (5).       Number of Sentinel Nodes Examined: Five (5).       Number of Lymph Nodes with Macrometastases (>2 mm): None (0).       Number of Lymph Nodes with Micrometastases: None (0).       Number of Lymph Nodes with Isolated Tumor Cells (=0.2 mm or =200 cells): None (0).       Size of Largest Metastatic Deposit (mm): N.A.       Extranodal Extension: N.A.   Distant Metastasis:       Distant Site(s) Involved: None known.  Breast Biomarker Testing Performed on Previous Biopsy:       Testing Performed on Case Number: [id number] from 08/04/2021.             Estrogen Receptor: 100% (Strong intensity).             Progesterone Receptor: 95% (Strong intensity).             HER2: 2+ by IHC / Negative by FISH.             Ki-67: 10%  Pathologic Stage Classification (pTNM, AJCC 8th Edition): pT1c, pN0  Representative Tumor Block: A3 and A2.  Comment(s): None.   B. LYMPH NODE, RIGHT (5), SENTINEL, EXCISION:  5 lymph nodes with fatty infiltration which is negative for metastatic  Carcinoma. (0/5)       INTERVAL HISTORY:  Sheri KENGis here for a follow up of breast cancer. She was last seen by me on 09/01/21 in consultation. She presents to the clinic alone. She reports she is recovering well from surgery.   All other systems were reviewed with the patient and are negative.  MEDICAL HISTORY:  Past Medical History:  Diagnosis Date   Anxiety    [place]   Right Breast, first seen on mammogram done 07/06/21   Depression    Family history of breast cancer    Family history of melanoma    Family history of uterine cancer    Fibroid    Gastroschisis     SURGICAL HISTORY: Past Surgical History:  Procedure Laterality Date   ABDOMINAL HYSTERECTOMY     supracervical hysterectomy   ABDOMINAL SURGERY     as a newborn, gastrogheschesis   BREAST LUMPECTOMY WITH RADIOACTIVE SEED AND SENTINEL LYMPH NODE BIOPSY Right 09/10/2021   Procedure: RIGHT BREAST LUMPECTOMY WITH RADIOACTIVE SEED AND SENTINEL LYMPH NODE BIOPSY;  Surgeon: BStark Klein MD;  [facility]  Service: General;  Laterality: Right;   OOPHORECTOMY      I have reviewed the social history and family history with the patient and they are unchanged from previous note.  ALLERGIES:  has No Known Allergies.  MEDICATIONS:  Current Outpatient Medications   Medication Sig Dispense Refill   amphetamine-dextroamphetamine (ADDERALL XR) 25 MG 24 hr capsule TAKE 1 CAPSULE BY MOUTH EVERY MORNING. 30 capsule 0   fluticasone (FLONASE) 50 MCG/ACT nasal spray Place 2 sprays into both nostrils daily.     loratadine (CLARITIN) 10 MG tablet Take 10 mg by mouth daily.     LORazepam (ATIVAN) 0.5 MG tablet Take 1 tablet (0.5 mg total) by mouth every  8 (eight) hours. (Patient taking differently: Take 0.5 mg by mouth every 8 (eight) hours as needed for anxiety.) 30 tablet 1   oxyCODONE (OXY IR/ROXICODONE) 5 MG immediate release tablet Take 1 tablet (5 mg total) by mouth every 6 (six) hours as needed for severe pain. 10 tablet 0   vortioxetine HBr (TRINTELLIX) 20 MG TABS tablet Take 1 tablet by mouth daily. 90 tablet 0   zolpidem (AMBIEN) 10 MG tablet TAKE 1 TABLET BY MOUTH ONCE DAILY AT BEDTIME AS NEEDED FOR SLEEP (Patient taking differently: Take 10 mg by mouth at bedtime.) 90 tablet 1   No current facility-administered medications for this visit.    PHYSICAL EXAMINATION: ECOG PERFORMANCE STATUS: 1 - Symptomatic but completely ambulatory  Vitals:   10/01/21 1258  BP: 118/81  Pulse: (!) 101  Resp: 16  Temp: 98 F (36.7 C)  SpO2: 100%   Wt Readings from Last 3 Encounters:  10/01/21 157 lb 11.2 oz (71.5 kg)  09/10/21 156 lb (70.8 kg)  09/08/21 157 lb (71.2 kg)     GENERAL:alert, no distress and comfortable SKIN: skin color normal, no rashes or significant lesions EYES: normal, Conjunctiva are pink and non-injected, sclera clear  NEURO: alert & oriented x 3 with fluent speech  LABORATORY DATA:  I have reviewed the data as listed CBC Latest Ref Rng & Units 09/01/2021 04/16/2021 12/29/2020  WBC 4.0 - 10.5 K/uL 7.5 5.4 6.0  Hemoglobin 12.0 - 15.0 g/dL 13.7 14.0 14.6  Hematocrit 36.0 - 46.0 % 41.3 40.6 42.8  Platelets 150 - 400 K/uL 325 293 321     CMP Latest Ref Rng & Units 04/16/2021 12/29/2020 09/30/2020  Glucose 65 - 99 mg/dL 91 97 99  BUN 6 - 24  mg/dL _0 Creatinine 0.57 - 1.00 mg/dL 0.70 0.69 0.81  Sodium 134 - 144 mmol/L 141 141 141  Potassium 3.5 - 5.2 mmol/L 4.0 4.5 4.3  Chloride 96 - 106 mmol/L 100 104 100  CO2 20 - 29 mmol/L _1 Calcium 8.7 - 10.2 mg/dL 9.1 9.0 10.3(H)  Total Protein 6.0 - 8.5 g/dL 6.3 6.5 7.5  Total Bilirubin 0.0 - 1.2 mg/dL 0.5 0.6 0.4  Alkaline Phos 44 - 121 IU/L 91 85 100  AST 0 - 40 IU/L 21 28 39  ALT 0 - 32 IU/L 27 33(H) 54(H)      RADIOGRAPHIC STUDIES: I have personally reviewed the radiological images as listed and agreed with the findings in the report. No results found.    No orders of the defined types were placed in this encounter.  All questions were answered. The patient knows to call the clinic with any problems, questions or concerns. No barriers to learning was detected. The total time spent in the appointment was 30 minutes.     Truitt Merle, MD 10/01/2021   I, Wilburn Mylar, am acting as scribe for Truitt Merle, MD.   I have reviewed the above documentation for accuracy and completeness, and I agree with the above.

## 2021-10-02 ENCOUNTER — Other Ambulatory Visit (HOSPITAL_COMMUNITY): Payer: Self-pay

## 2021-10-06 ENCOUNTER — Encounter: Payer: Self-pay | Admitting: *Deleted

## 2021-10-14 ENCOUNTER — Telehealth: Payer: Self-pay

## 2021-10-14 NOTE — Progress Notes (Signed)
Radiation Oncology         [phone] ________________________________  Name: Sheri Moore        MRN: [id number]  Date of Service: 10/15/2021 DOB: 05-22-1981  CC:Cox, Elnita Maxwell, MD  Stark Klein, MD     REFERRING PHYSICIAN: Stark Klein, MD   DIAGNOSIS: The encounter diagnosis was Cancer of central portion of right breast [facility]).   HISTORY OF PRESENT ILLNESS: Sheri Moore is a 41 y.o. female seen at the request of Dr. Barry Dienes for a new diagnosis of right breast cancer. She had an asymmetry and possible mass in the right breast. Diagnostic imaging revealed a mass measuring 1 cm in the 9:00 position and the asymmetry resolved. Her right axilla was negative for adenopathy.  And a biopsy on 08/04/21 showed a grade 2 invasive ductal carcinoma with associated DCIS. The tumor was ER/PR positive, HER2 was negative with a Ki 67 of 10%.   Since her last visit she underwent lumpectomy with sentinel node biopsy on 09/10/21 showing a 1.2 cm grade 2 invasive ductal carcinoma. Her 5 sampled nodes were all negative and her margins were clear. Her oncotype dx score was 22, and she was offered adjuvant chemotherapy but has decided to forgo this. She's seen today to proceed with adjuvant radiotherapy.   PREVIOUS RADIATION THERAPY: No   PAST MEDICAL HISTORY:  Past Medical History:  Diagnosis Date   Anxiety    Breast cancer ([place]) 07/06/21   Right Breast, first seen on mammogram done 07/06/21   Depression    Family history of breast cancer    Family history of melanoma    Family history of uterine cancer    Fibroid    Gastroschisis        PAST SURGICAL HISTORY: Past Surgical History:  Procedure Laterality Date   ABDOMINAL HYSTERECTOMY     supracervical hysterectomy   ABDOMINAL SURGERY     as a newborn, gastrogheschesis   BREAST LUMPECTOMY WITH RADIOACTIVE SEED AND SENTINEL LYMPH NODE BIOPSY Right 09/10/2021   Procedure: RIGHT BREAST LUMPECTOMY WITH RADIOACTIVE SEED AND SENTINEL LYMPH NODE  BIOPSY;  Surgeon: Stark Klein, MD;  Location: [place];  Service: General;  Laterality: Right;   OOPHORECTOMY       FAMILY HISTORY:  Family History  Problem Relation Age of Onset   Uterine cancer Mother 29   Diabetes Mother    Hypertension Mother    Thyroid disease Mother    Obesity Mother    Subarachnoid hemorrhage Father    Melanoma Father 48   Diabetes Father    Diabetes Sister    Obesity Sister    Diabetes Maternal Grandmother    Hypertension Maternal Grandmother    Melanoma Paternal Grandfather 13   Diabetes Paternal Grandfather    Breast cancer Other 49       MGMs sister   Breast cancer Other 13       MGF's sister   Breast cancer Other        dx < 18; PGMs sister     SOCIAL HISTORY:  reports that she quit smoking about 20 years ago. Her smoking use included cigarettes. She has a 0.75 pack-year smoking history. She has quit using smokeless tobacco. She reports that she does not currently use alcohol. She reports that she does not use drugs. The patient is married and lives in [place]. She works in a [employer] in [place]. She has a 56 year old son who's active with basketball as  well as travel baseball during season.   ALLERGIES: Patient has no known allergies.   MEDICATIONS:  Current Outpatient Medications  Medication Sig Dispense Refill   amphetamine-dextroamphetamine (ADDERALL XR) 25 MG 24 hr capsule TAKE 1 CAPSULE BY MOUTH EVERY MORNING. 30 capsule 0   fluticasone (FLONASE) 50 MCG/ACT nasal spray Place 2 sprays into both nostrils daily.     loratadine (CLARITIN) 10 MG tablet Take 10 mg by mouth daily.     LORazepam (ATIVAN) 0.5 MG tablet Take 1 tablet (0.5 mg total) by mouth every 8 (eight) hours. (Patient taking differently: Take 0.5 mg by mouth every 8 (eight) hours as needed for anxiety.) 30 tablet 1   oxyCODONE (OXY IR/ROXICODONE) 5 MG immediate release tablet Take 1 tablet (5 mg total) by mouth every 6 (six) hours as needed for severe  pain. 10 tablet 0   vortioxetine HBr (TRINTELLIX) 20 MG TABS tablet Take 1 tablet by mouth daily. 90 tablet 0   zolpidem (AMBIEN) 10 MG tablet TAKE 1 TABLET BY MOUTH ONCE DAILY AT BEDTIME AS NEEDED FOR SLEEP (Patient taking differently: Take 10 mg by mouth at bedtime.) 90 tablet 1   No current facility-administered medications for this visit.     REVIEW OF SYSTEMS: On review of systems, the patient reports that she is doing well. She has had some fullness in her breast and has been wearing a sports bra. No other complaints are verbalized.      PHYSICAL EXAM:  Wt Readings from Last 3 Encounters:  10/01/21 157 lb 11.2 oz (71.5 kg)  09/10/21 156 lb (70.8 kg)  09/08/21 157 lb (71.2 kg)    In general this is a well appearing caucasian female in no acute distress. She's alert and oriented x4 and appropriate throughout the examination. Cardiopulmonary assessment is negative for acute distress and she exhibits normal effort. The right breast incision site is intact without erythema, separation or drainage. There is however a large seroma deep to the incision that is about 4 cm in greatest dimension, and associated bruising of her skin superior to her areola at the midline.     ECOG = 0  0 - Asymptomatic (Fully active, able to carry on all predisease activities without restriction)  1 - Symptomatic but completely ambulatory (Restricted in physically strenuous activity but ambulatory and able to carry out work of a light or sedentary nature. For example, light housework, office work)  2 - Symptomatic, <50% in bed during the day (Ambulatory and capable of all self care but unable to carry out any work activities. Up and about more than 50% of waking hours)  3 - Symptomatic, >50% in bed, but not bedbound (Capable of only limited self-care, confined to bed or chair 50% or more of waking hours)  4 - Bedbound (Completely disabled. Cannot carry on any self-care. Totally confined to bed or  chair)  5 - Death   Eustace Pen MM, Creech RH, Tormey DC, et al. [phone]). "Toxicity and response criteria of the [facility]". [place] Oncol. 5 (6): 649-55    LABORATORY DATA:  Lab Results  Component Value Date   WBC 7.5 09/01/2021   HGB 13.7 09/01/2021   HCT 41.3 09/01/2021   MCV 85.7 09/01/2021   PLT 325 09/01/2021   Lab Results  Component Value Date   NA 141 04/16/2021   K 4.0 04/16/2021   CL 100 04/16/2021   CO2 24 04/16/2021   Lab Results  Component Value Date  ALT 27 04/16/2021   AST 21 04/16/2021   ALKPHOS 91 04/16/2021   BILITOT 0.5 04/16/2021      RADIOGRAPHY: No results found.     IMPRESSION/PLAN: 1. Stage IA, pT1cN0M0 grade 2, ER/PR positive invasive ductal carcinoma of the right breast. Dr. Lisbeth Renshaw discusses the final pathology findings and reviews the nature of early stage breast disease. She has done well since surgery and has decided to forgo systemic therapy. She would benefit from external radiotherapy to the breast  to reduce risks of local recurrence followed by antiestrogen therapy. We discussed the risks, benefits, short, and long term effects of radiotherapy, as well as the curative intent, and the patient is interested in proceeding. Dr. Lisbeth Renshaw discusses the delivery and logistics of radiotherapy and recommends 6 1/2 weeks of radiotherapy to the right breast. Written consent is obtained and placed in the chart, a copy was provided to the patient. She will return for simulation on 10/26/21 so we can see improvement in her seroma.  2. Postoperative seroma. I encouraged her to continue wearing a sports bra and I will ask PT to see her to see if she needs more compression/wedge insert.   In a visit lasting 45 minutes, greater than 50% of the time was spent face to face discussing the patient's condition, in preparation for the discussion, and coordinating the patient's care.    The above documentation reflects my direct findings during  this shared patient visit. Please see the separate note by Dr. Lisbeth Renshaw on this date for the remainder of the patient's plan of care.    Carola Rhine, [facility]    **Disclaimer: This note was dictated with voice recognition software. Similar sounding words can inadvertently be transcribed and this note may contain transcription errors which may not have been corrected upon publication of note.**

## 2021-10-14 NOTE — Telephone Encounter (Signed)
Patient notified of 2:00pm-10/15/21 in-person appointment w/ Shona Simpson PA-C. Advised patient to arrive 7min early for check-in. Gave my extension if patient needs to call, [phone]. Patient verbalized information given.

## 2021-10-15 ENCOUNTER — Ambulatory Visit: Payer: No Typology Code available for payment source | Admitting: Radiation Oncology

## 2021-10-15 ENCOUNTER — Ambulatory Visit
Admission: RE | Admit: 2021-10-15 | Discharge: 2021-10-15 | Disposition: A | Discharge status: Home / Self Care | Payer: No Typology Code available for payment source | Source: Ambulatory Visit | Attending: Radiation Oncology | Admitting: Radiation Oncology

## 2021-10-15 ENCOUNTER — Encounter: Payer: Self-pay | Admitting: Radiation Oncology

## 2021-10-15 ENCOUNTER — Other Ambulatory Visit: Payer: Self-pay

## 2021-10-15 VITALS — BP 115/93 | HR 90 | Temp 98.1°F | Resp 19 | Ht 66.0 in | Wt 153.0 lb

## 2021-10-15 DIAGNOSIS — F419 Anxiety disorder, unspecified: Secondary | ICD-10-CM | POA: Diagnosis not present

## 2021-10-15 DIAGNOSIS — Z170 Estrogen receptor positive status [ER+]: Secondary | ICD-10-CM | POA: Insufficient documentation

## 2021-10-15 DIAGNOSIS — Z803 Family history of malignant neoplasm of breast: Secondary | ICD-10-CM | POA: Diagnosis not present

## 2021-10-15 DIAGNOSIS — Z808 Family history of malignant neoplasm of other organs or systems: Secondary | ICD-10-CM | POA: Insufficient documentation

## 2021-10-15 DIAGNOSIS — C50111 Malignant neoplasm of central portion of right female breast: Secondary | ICD-10-CM

## 2021-10-15 DIAGNOSIS — F418 Other specified anxiety disorders: Secondary | ICD-10-CM | POA: Diagnosis not present

## 2021-10-15 DIAGNOSIS — Z87891 Personal history of nicotine dependence: Secondary | ICD-10-CM | POA: Insufficient documentation

## 2021-10-15 DIAGNOSIS — M96843 Postprocedural seroma of a musculoskeletal structure following other procedure: Secondary | ICD-10-CM | POA: Insufficient documentation

## 2021-10-15 NOTE — Progress Notes (Addendum)
Patient reports RT axilla tenderness 1/10, fatigue, and mild headaches. No other symptoms reported at this time.  Meaningful use complete. Hysterectomy- No chances of pregnancy  BP (!) 115/93 (BP Location: Left Arm, Patient Position: Sitting, Cuff Size: Normal)    Pulse 90    Temp 98.1 F (36.7 C) (Oral)    Resp 19    Ht 5\' 6"  (1.676 m)    Wt 153 lb (69.4 kg)    SpO2 99%    BMI 24.69 kg/m

## 2021-10-16 ENCOUNTER — Telehealth: Payer: Self-pay

## 2021-10-16 NOTE — Telephone Encounter (Signed)
Per Shona Simpson PA-C... "let her know that PT doesn't recommend anyone in [place], so i'll ask them to see her in [place]."  I spoke w/ patient and verified her identity. Notified her that attempts are being made to get her seen in PT by someone in [place]. Patient states "That is fine." She will be on the lookout for some information regarding this appointment. I left my extension for patient to call with any questions. [phone]

## 2021-10-20 ENCOUNTER — Encounter: Payer: Self-pay | Admitting: *Deleted

## 2021-10-21 ENCOUNTER — Ambulatory Visit: Payer: No Typology Code available for payment source | Attending: Radiation Oncology | Admitting: Physical Therapy

## 2021-10-21 ENCOUNTER — Other Ambulatory Visit: Payer: Self-pay

## 2021-10-21 ENCOUNTER — Encounter: Payer: Self-pay | Admitting: Physical Therapy

## 2021-10-21 DIAGNOSIS — R600 Localized edema: Secondary | ICD-10-CM | POA: Insufficient documentation

## 2021-10-21 DIAGNOSIS — Z170 Estrogen receptor positive status [ER+]: Secondary | ICD-10-CM | POA: Diagnosis present

## 2021-10-21 DIAGNOSIS — Z483 Aftercare following surgery for neoplasm: Secondary | ICD-10-CM | POA: Insufficient documentation

## 2021-10-21 DIAGNOSIS — C50111 Malignant neoplasm of central portion of right female breast: Secondary | ICD-10-CM | POA: Diagnosis present

## 2021-10-21 NOTE — Therapy (Signed)
[facility] @ [facility] [place], [place], [address] Phone: [phone]   Fax:  [phone]  Physical Therapy Evaluation  Patient Details  Name: COTINA FREEDMAN MRN: [id number] Date of Birth: 01-17-1981 Referring Provider (PT): Shona Simpson   Encounter Date: 10/21/2021   PT End of Session - 10/21/21 1351     Visit Number 1    Number of Visits 9    Date for PT Re-Evaluation 11/18/21    PT Start Time 1304    PT Stop Time 1350    PT Time Calculation (min) 46 min    Activity Tolerance Patient tolerated treatment well    Behavior During Therapy [facility] for tasks assessed/performed             Past Medical History:  Diagnosis Date   Anxiety    Breast cancer ([place]) 07/06/21   Right Breast, first seen on mammogram done 07/06/21   Depression    Family history of breast cancer    Family history of melanoma    Family history of uterine cancer    Fibroid    Gastroschisis     Past Surgical History:  Procedure Laterality Date   ABDOMINAL HYSTERECTOMY     supracervical hysterectomy   ABDOMINAL SURGERY     as a newborn, gastrogheschesis   BREAST LUMPECTOMY WITH RADIOACTIVE SEED AND SENTINEL LYMPH NODE BIOPSY Right 09/10/2021   Procedure: RIGHT BREAST LUMPECTOMY WITH RADIOACTIVE SEED AND SENTINEL LYMPH NODE BIOPSY;  Surgeon: Stark Klein, MD;  Location: [facility];  Service: General;  Laterality: Right;   OOPHORECTOMY      There were no vitals filed for this visit.    Subjective Assessment - 10/21/21 1305     Subjective I have a seroma. It does not cause pain but it is a fullness feeling. It has come up over the last couple of weeks. They are hoping to get the fluid down prior to radiation. Sometimes I feel I have swelling in my armpit.    Pertinent History R breast cancer, ER/PR+, Her2-, 09/10/21 R lumpectomy and SLNB (0/5)    Patient Stated Goals to get the swelling gone in order to start radiation    Currently in Pain?  No/denies    Pain Score 0-No pain                [facility] PT Assessment - 10/21/21 0001       Assessment   Medical Diagnosis R breast cancer    Referring Provider (PT) Shona Simpson    Onset Date/Surgical Date 09/10/21    Hand Dominance Right    Prior Therapy none      Precautions   Precautions Other (comment)    Precaution Comments at risk for lymphedema      Restrictions   Weight Bearing Restrictions No      Balance Screen   Has the patient fallen in the past 6 months No    Has the patient had a decrease in activity level because of a fear of falling?  No    Is the patient reluctant to leave their home because of a fear of falling?  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      Cognition   Overall Cognitive Status Within Functional Limits for tasks assessed      Observation/Other Assessments   Observations palpable seroma in R breast and fullness of R lateral trunk      Posture/Postural Control   Posture/Postural Control Postural limitations    Postural Limitations Rounded Shoulders;Forward head      ROM / Strength   AROM / PROM / Strength AROM      AROM   Overall AROM  Within functional limits for tasks performed    Overall AROM Comments bilateral shoulders WFL               LYMPHEDEMA/ONCOLOGY QUESTIONNAIRE - 10/21/21 0001       Lymphedema Assessments   Lymphedema Assessments Upper extremities      Right Upper Extremity Lymphedema   15 cm Proximal to Olecranon Process 29 cm    Olecranon Process 25 cm    15 cm Proximal to Ulnar Styloid Process 24.6 cm    Just Proximal to Ulnar Styloid Process 15.8 cm    Across Hand at  [employer] 18 cm    At [place] of 2nd Digit 5.5 cm      Left Upper Extremity Lymphedema   15 cm Proximal to Olecranon Process 30 cm    Olecranon Process 25 cm    15 cm Proximal to Ulnar Styloid Process 25 cm    Just Proximal to Ulnar Styloid Process 15 cm    Across Hand at [employer] 18.5 cm    At [place] of 2nd Digit 5.6 cm                     Objective measurements completed on examination: See above findings.       [facility] Adult PT Treatment/Exercise - 10/21/21 0001       Manual Therapy   Manual Therapy Edema management;Soft tissue mobilization;Manual Lymphatic Drainage (MLD)    Edema Management created foam chip pack for pt to wear in her bra, issued signed script for pt to obtain compression bra    Soft tissue mobilization to R breast seroma    Manual Lymphatic Drainage (MLD) in supine while educating pt in correct technique throughout: short neck, 5 diaphragmatic breaths, left axillary nodes and establishment of interaxillary pathway, right inguinal nodes and establishment of axillo inguinal pathway, R breast in area of seroma moving fluid towards pathways                     PT Education - 10/21/21 1353     Education Details anatomy and physiology of the lymphatic system, ABC class, seroma vs lymphedema, need for compression bra    Person(s) Educated Patient    Methods Explanation    Comprehension Verbalized understanding                 PT Long Term Goals - 10/21/21 1352       PT LONG TERM GOAL #1   Title Pt will report a 50% reduction in size of seroma in R breast so she can begin her radiation.    Time 4    Period Weeks    Status New    Target Date 11/18/21      PT LONG TERM GOAL #2   Title Pt will be independent in self MLD for management of edema.  Time 4    Period Weeks    Status New    Target Date 11/18/21      PT LONG TERM GOAL #3   Title Pt will obtain an appropriate compression bra for management of seroma.     Time 4    Period Weeks    Status New    Target Date 11/18/21                    Plan - 10/21/21 1354     Clinical Impression Statement Pt reports to PT after undergoing a  R lumpectomy and SLNB (0/5) on 09/10/21. She reports she has developed a seroma and her doctor sent her here to attempt to reduce it prior to beginning radiation. She is trying to avoid having the seroma drained. Pt also has fullness in R lateral trunk. Her shoulder ROM is WFL bilaterally.  Educated pt that sometimes seromas can be stubborn despite MLD and on the importance of compression to help the seroma heal. Issued a signed script for pt to obtain a compression bra from DME provider and created a foam chip pack for pt to wear in her bra. Began MLD to R breast today and soft tissue mobilization to seroma. Pt would benefit from skilled PT services to decrease seroma and instruct pt in self MLD for independent management.    Stability/Clinical Decision Making Stable/Uncomplicated    Clinical Decision Making Low    Rehab Potential Good    PT Frequency 2x / week    PT Duration 4 weeks    PT Treatment/Interventions ADLs/Self Care Home Management;Therapeutic exercise;Patient/family education;Orthotic Fit/Training;Manual techniques;Manual lymph drainage;Compression bandaging;Scar mobilization;Vasopneumatic Device    PT Next Visit Plan continue MLD to R breast and instruct pt in this, see if she obtained a compression bra, ensure she is signed up for ABC class    Recommended Other Services issued signed script for compression bra    Consulted and Agree with Plan of Care Patient             Patient will benefit from skilled therapeutic intervention in order to improve the following deficits and impairments:  Postural dysfunction, Increased edema, Decreased knowledge of precautions  Visit Diagnosis: Localized edema - Plan: PT plan of care cert/re-cert  Aftercare following surgery for neoplasm - Plan: PT plan of  care cert/re-cert  Malignant neoplasm of central portion of right breast in female, estrogen receptor positive ([place]) - Plan: PT plan of care cert/re-cert     Problem List Patient Active Problem List   Diagnosis Date Noted   Cancer of central portion of right breast ([place]) 09/01/2021   Genetic testing 08/31/2021   Family history of breast cancer 08/19/2021   Family history of melanoma 08/19/2021   Family history of uterine cancer 08/19/2021   Cognitive changes 04/29/2021   Attention deficit disorder (ADD) without hyperactivity 01/09/2020   GAD (generalized anxiety disorder) 01/09/2020   Somnolence, daytime 01/09/2020   Facial numbness 12/12/2018   White matter abnormality on MRI of brain 11/09/2018   Other fatigue 11/09/2018   Depression with anxiety 11/09/2018   Left arm numbness 10/17/2018   Left arm weakness 10/17/2018   Sprain of anterior talofibular ligament of right ankle 05/08/2017    Allyson Sabal [place], PT 10/21/2021, 2:02 PM  [facility] @ [facility] [place], [place], [address] Phone: [phone]   Fax:  [phone]  Name: AVIGAIL PILLING MRN: [id number] Date of Birth:  11/22/1980

## 2021-10-22 ENCOUNTER — Other Ambulatory Visit (HOSPITAL_COMMUNITY): Payer: Self-pay

## 2021-10-26 ENCOUNTER — Other Ambulatory Visit: Payer: Self-pay

## 2021-10-26 ENCOUNTER — Ambulatory Visit
Admission: RE | Admit: 2021-10-26 | Discharge: 2021-10-26 | Disposition: A | Discharge status: Home / Self Care | Payer: No Typology Code available for payment source | Source: Ambulatory Visit | Attending: Radiation Oncology | Admitting: Radiation Oncology

## 2021-10-26 DIAGNOSIS — Z510 Encounter for antineoplastic radiation therapy: Secondary | ICD-10-CM | POA: Insufficient documentation

## 2021-10-26 DIAGNOSIS — Z170 Estrogen receptor positive status [ER+]: Secondary | ICD-10-CM | POA: Insufficient documentation

## 2021-10-26 DIAGNOSIS — C50112 Malignant neoplasm of central portion of left female breast: Secondary | ICD-10-CM | POA: Diagnosis present

## 2021-10-27 ENCOUNTER — Ambulatory Visit: Payer: No Typology Code available for payment source

## 2021-10-30 ENCOUNTER — Encounter: Payer: Self-pay | Admitting: *Deleted

## 2021-10-30 DIAGNOSIS — Z510 Encounter for antineoplastic radiation therapy: Secondary | ICD-10-CM | POA: Diagnosis not present

## 2021-11-02 ENCOUNTER — Encounter: Payer: Self-pay | Admitting: *Deleted

## 2021-11-03 ENCOUNTER — Ambulatory Visit
Admission: RE | Admit: 2021-11-03 | Discharge: 2021-11-03 | Disposition: A | Discharge status: Home / Self Care | Payer: No Typology Code available for payment source | Source: Ambulatory Visit | Attending: Radiation Oncology | Admitting: Radiation Oncology

## 2021-11-03 ENCOUNTER — Encounter: Payer: No Typology Code available for payment source | Admitting: Physical Therapy

## 2021-11-03 ENCOUNTER — Telehealth: Payer: Self-pay | Admitting: Hematology

## 2021-11-03 ENCOUNTER — Other Ambulatory Visit: Payer: Self-pay

## 2021-11-03 DIAGNOSIS — Z510 Encounter for antineoplastic radiation therapy: Secondary | ICD-10-CM | POA: Diagnosis not present

## 2021-11-03 NOTE — Telephone Encounter (Signed)
Sch per 1/23 inbasket, pt aware °

## 2021-11-04 ENCOUNTER — Ambulatory Visit
Admission: RE | Admit: 2021-11-04 | Discharge: 2021-11-04 | Disposition: A | Discharge status: Home / Self Care | Payer: No Typology Code available for payment source | Source: Ambulatory Visit | Attending: Radiation Oncology | Admitting: Radiation Oncology

## 2021-11-04 DIAGNOSIS — Z510 Encounter for antineoplastic radiation therapy: Secondary | ICD-10-CM | POA: Diagnosis not present

## 2021-11-05 ENCOUNTER — Ambulatory Visit
Admission: RE | Admit: 2021-11-05 | Discharge: 2021-11-05 | Disposition: A | Discharge status: Home / Self Care | Payer: No Typology Code available for payment source | Source: Ambulatory Visit | Attending: Radiation Oncology | Admitting: Radiation Oncology

## 2021-11-05 ENCOUNTER — Other Ambulatory Visit: Payer: Self-pay

## 2021-11-05 ENCOUNTER — Inpatient Hospital Stay: Payer: No Typology Code available for payment source | Attending: Genetic Counselor | Admitting: Dietician

## 2021-11-05 DIAGNOSIS — Z510 Encounter for antineoplastic radiation therapy: Secondary | ICD-10-CM | POA: Diagnosis not present

## 2021-11-05 DIAGNOSIS — C50111 Malignant neoplasm of central portion of right female breast: Secondary | ICD-10-CM

## 2021-11-05 MED ORDER — ALRA NON-METALLIC DEODORANT (RAD-ONC)
1.0000 "application " | Freq: Once | TOPICAL | Status: AC
  Administered 2021-11-05: 1 via TOPICAL

## 2021-11-05 MED ORDER — RADIAPLEXRX EX GEL: Freq: Once | CUTANEOUS | Status: AC

## 2021-11-05 NOTE — Progress Notes (Signed)
Pt here for patient teaching.  Pt given Radiation and You booklet, skin care instructions, Alra deodorant, and Radiaplex gel.  Reviewed areas of pertinence such as fatigue, hair loss, skin changes, breast tenderness, and breast swelling . Pt able to give teach back of to pat skin and use unscented/gentle soap,apply Radiaplex bid, avoid applying anything to skin within 4 hours of treatment, avoid wearing an under wire bra, and to use an electric razor if they must shave. Pt verbalizes understanding of information given and will contact nursing with any questions or concerns.    Dalaney Needle M. Dreonna Hussein RN, BSN      

## 2021-11-06 ENCOUNTER — Ambulatory Visit
Admission: RE | Admit: 2021-11-06 | Discharge: 2021-11-06 | Disposition: A | Discharge status: Home / Self Care | Payer: No Typology Code available for payment source | Source: Ambulatory Visit | Attending: Radiation Oncology | Admitting: Radiation Oncology

## 2021-11-06 DIAGNOSIS — Z510 Encounter for antineoplastic radiation therapy: Secondary | ICD-10-CM | POA: Diagnosis not present

## 2021-11-06 NOTE — Progress Notes (Signed)
Patient attended Nutrition 101 Class on 11/05/2021.  ° °Suzanne Fannye Myer, RD °Clinical Dietitian °[facility] ° °

## 2021-11-09 ENCOUNTER — Other Ambulatory Visit: Payer: Self-pay

## 2021-11-09 ENCOUNTER — Ambulatory Visit
Admission: RE | Admit: 2021-11-09 | Discharge: 2021-11-09 | Disposition: A | Discharge status: Home / Self Care | Payer: No Typology Code available for payment source | Source: Ambulatory Visit | Attending: Radiation Oncology | Admitting: Radiation Oncology

## 2021-11-09 ENCOUNTER — Encounter: Payer: No Typology Code available for payment source | Admitting: Physical Therapy

## 2021-11-09 DIAGNOSIS — Z510 Encounter for antineoplastic radiation therapy: Secondary | ICD-10-CM | POA: Diagnosis not present

## 2021-11-10 ENCOUNTER — Ambulatory Visit
Admission: RE | Admit: 2021-11-10 | Discharge: 2021-11-10 | Disposition: A | Discharge status: Home / Self Care | Payer: No Typology Code available for payment source | Source: Ambulatory Visit | Attending: Radiation Oncology | Admitting: Radiation Oncology

## 2021-11-10 DIAGNOSIS — Z510 Encounter for antineoplastic radiation therapy: Secondary | ICD-10-CM | POA: Diagnosis not present

## 2021-11-11 ENCOUNTER — Ambulatory Visit
Admission: RE | Admit: 2021-11-11 | Discharge: 2021-11-11 | Disposition: A | Discharge status: Home / Self Care | Payer: No Typology Code available for payment source | Source: Ambulatory Visit | Attending: Radiation Oncology | Admitting: Radiation Oncology

## 2021-11-11 ENCOUNTER — Encounter: Payer: No Typology Code available for payment source | Admitting: Physical Therapy

## 2021-11-11 DIAGNOSIS — C50112 Malignant neoplasm of central portion of left female breast: Secondary | ICD-10-CM | POA: Insufficient documentation

## 2021-11-11 DIAGNOSIS — Z170 Estrogen receptor positive status [ER+]: Secondary | ICD-10-CM | POA: Insufficient documentation

## 2021-11-11 DIAGNOSIS — Z510 Encounter for antineoplastic radiation therapy: Secondary | ICD-10-CM | POA: Diagnosis present

## 2021-11-12 ENCOUNTER — Other Ambulatory Visit: Payer: Self-pay

## 2021-11-12 ENCOUNTER — Ambulatory Visit
Admission: RE | Admit: 2021-11-12 | Discharge: 2021-11-12 | Disposition: A | Discharge status: Home / Self Care | Payer: No Typology Code available for payment source | Source: Ambulatory Visit | Attending: Radiation Oncology | Admitting: Radiation Oncology

## 2021-11-12 DIAGNOSIS — Z510 Encounter for antineoplastic radiation therapy: Secondary | ICD-10-CM | POA: Diagnosis not present

## 2021-11-13 ENCOUNTER — Ambulatory Visit
Admission: RE | Admit: 2021-11-13 | Discharge: 2021-11-13 | Disposition: A | Discharge status: Home / Self Care | Payer: No Typology Code available for payment source | Source: Ambulatory Visit | Attending: Radiation Oncology | Admitting: Radiation Oncology

## 2021-11-13 DIAGNOSIS — Z510 Encounter for antineoplastic radiation therapy: Secondary | ICD-10-CM | POA: Diagnosis not present

## 2021-11-16 ENCOUNTER — Encounter: Payer: Self-pay | Admitting: Nurse Practitioner

## 2021-11-16 ENCOUNTER — Ambulatory Visit (INDEPENDENT_AMBULATORY_CARE_PROVIDER_SITE_OTHER): Payer: No Typology Code available for payment source | Admitting: Nurse Practitioner

## 2021-11-16 ENCOUNTER — Encounter: Payer: Self-pay | Admitting: Radiation Oncology

## 2021-11-16 ENCOUNTER — Other Ambulatory Visit (HOSPITAL_COMMUNITY): Payer: Self-pay

## 2021-11-16 ENCOUNTER — Ambulatory Visit
Admission: RE | Admit: 2021-11-16 | Discharge: 2021-11-16 | Disposition: A | Discharge status: Home / Self Care | Payer: No Typology Code available for payment source | Source: Ambulatory Visit | Attending: Radiation Oncology | Admitting: Radiation Oncology

## 2021-11-16 ENCOUNTER — Encounter: Payer: Self-pay | Admitting: General Practice

## 2021-11-16 ENCOUNTER — Encounter: Payer: No Typology Code available for payment source | Admitting: Physical Therapy

## 2021-11-16 VITALS — BP 156/98 | HR 108 | Temp 97.4°F | Ht 65.0 in | Wt 155.0 lb

## 2021-11-16 DIAGNOSIS — Z510 Encounter for antineoplastic radiation therapy: Secondary | ICD-10-CM | POA: Diagnosis not present

## 2021-11-16 DIAGNOSIS — F418 Other specified anxiety disorders: Secondary | ICD-10-CM | POA: Diagnosis not present

## 2021-11-16 DIAGNOSIS — R030 Elevated blood-pressure reading, without diagnosis of hypertension: Secondary | ICD-10-CM | POA: Diagnosis not present

## 2021-11-16 MED ORDER — ESCITALOPRAM OXALATE 10 MG PO TABS
10.0000 mg | ORAL_TABLET | Freq: Every day | ORAL | 0 refills | Status: DC
  Filled 2021-11-16: qty 90, 90d supply, fill #0

## 2021-11-16 NOTE — Progress Notes (Signed)
Acute Office Visit  Subjective:    Patient ID: Sheri Moore, female    DOB: Oct 20, 1980, 41 y.o.   MRN: [id number]  Chief Complaint  Patient presents with   Depression    HPI: Patient is in today for evaluation of depression and anxiety. She was recently diagnosed with breast cancer, currently receiving radiation treatment. She lost her grandmother that she was close to just prior to being diagnosed with breast cancer. She is not currently in counseling. She is scheduled to talk with clergy member this week at the cancer center. She tells me she is experiencing fatigue and irritability. States she feels that she has "not emotion". She was previously treated for depression with Citalopram, Trintellix, and Buspirone. She denies suicidal thoughts or plans to harm herself. S   Depression   She reports not currently taking treatment. Current symptoms include: depressed mood, difficulty concentrating, fatigue, feelings of worthlessness/guilt, hopelessness, hypersomnia, and insomnia She feels she is Worse since last visit.  Depression screen [facility] 2/9 11/16/2021 04/20/2021 09/30/2020  Decreased Interest 3 0 0  Down, Depressed, Hopeless 3 0 0  PHQ - 2 Score 6 0 0  Altered sleeping '2 1 1  ' Tired, decreased energy 2 3 0  Change in appetite 1 0 0  Feeling bad or failure about yourself  2 0 1  Trouble concentrating 2 2 -  Moving slowly or fidgety/restless 1 0 0  Suicidal thoughts 0 0 0  PHQ-9 Score '16 6 2  ' Difficult doing work/chores Somewhat difficult Somewhat difficult Not difficult at all    Anxiety, Follow-up  She was last seen for anxiety 12 months ago. Current treatment includes Ativan 0.5 mg PRN   She reports good compliance with treatment. She reports good tolerance of treatment. She is not having side effects.   She feels her anxiety is mild and Unchanged since last visit.  Symptoms: No chest pain Yes difficulty concentrating  No dizziness Yes fatigue  No feelings of  losing control Yes insomnia  Yes irritable No palpitations  No panic attacks Yes racing thoughts  No shortness of breath No sweating  No tremors/shakes    GAD-7 Results GAD-7 Generalized Anxiety Disorder Screening Tool 11/16/2021 04/20/2021  1. Feeling Nervous, Anxious, or on Edge 0 0  2. Not Being Able to Stop or Control Worrying 0 0  3. Worrying Too Much About Different Things 0 0  4. Trouble Relaxing 0 0  5. Being So Restless it's Hard To Sit Still 0 0  6. Becoming Easily Annoyed or Irritable 3 2  7. Feeling Afraid As If Something Awful Might Happen 0 0  Total GAD-7 Score 3 2  Difficulty At Work, Home, or Getting  Along With Others? Somewhat difficult Not difficult at all     Past Medical History:  Diagnosis Date   Anxiety    Breast cancer ([place]) 07/06/21   Right Breast, first seen on mammogram done 07/06/21   Depression    Family history of breast cancer    Family history of melanoma    Family history of uterine cancer    Fibroid    Gastroschisis     Past Surgical History:  Procedure Laterality Date   ABDOMINAL HYSTERECTOMY     supracervical hysterectomy   ABDOMINAL SURGERY     as a newborn, gastrogheschesis   BREAST LUMPECTOMY WITH RADIOACTIVE SEED AND SENTINEL LYMPH NODE BIOPSY Right 09/10/2021   Procedure: RIGHT BREAST LUMPECTOMY WITH RADIOACTIVE SEED AND SENTINEL LYMPH NODE BIOPSY;  Surgeon: Stark Klein, MD;  Location: [facility];  Service: General;  Laterality: Right;   OOPHORECTOMY      Family History  Problem Relation Age of Onset   Uterine cancer Mother 48   Diabetes Mother    Hypertension Mother    Thyroid disease Mother    Obesity Mother    Subarachnoid hemorrhage Father    Melanoma Father 53   Diabetes Father    Diabetes Sister    Obesity Sister    Diabetes Maternal Grandmother    Hypertension Maternal Grandmother    Melanoma Paternal Grandfather 49   Diabetes Paternal Grandfather    Breast cancer Other 65       MGMs sister   Breast cancer Other 10        MGF's sister   Breast cancer Other        dx < 37; PGMs sister    Social History   Socioeconomic History   Marital status: Married    Spouse name: Not on file   Number of children: 1   Years of education: College   Highest education level: Not on file  Occupational History   Occupation: [employer]  Tobacco Use   Smoking status: Former    Packs/day: 0.25    Years: 3.00    Pack years: 0.75    Types: Cigarettes    Quit date: 10/11/2001    Years since quitting: 20.1   Smokeless tobacco: Former  Scientific laboratory technician Use: Never used  Substance and Sexual Activity   Alcohol use: Not Currently    Comment: occ   Drug use: No   Sexual activity: Yes    Partners: Female    Birth control/protection: Surgical    Comment: supracervical hysterectomy  Other Topics Concern   Not on file  Social History Narrative   Lives with husband and son   Caffeine use: rarely   Right handed    Social Determinants of Health   Financial Resource Strain: Not on file  Food Insecurity: Not on file  Transportation Needs: Not on file  Physical Activity: Not on file  Stress: Not on file  Social Connections: Not on file  Intimate Partner Violence: Not At Risk   Fear of Current or Ex-Partner: No   Emotionally Abused: No   Physically Abused: No   Sexually Abused: No    Outpatient Medications Prior to Visit  Medication Sig Dispense Refill   amphetamine-dextroamphetamine (ADDERALL XR) 25 MG 24 hr capsule TAKE 1 CAPSULE BY MOUTH EVERY MORNING. 30 capsule 0   fluticasone (FLONASE) 50 MCG/ACT nasal spray Place 2 sprays into both nostrils daily.     loratadine (CLARITIN) 10 MG tablet Take 10 mg by mouth daily.     LORazepam (ATIVAN) 0.5 MG tablet Take 1 tablet (0.5 mg total) by mouth every 8 (eight) hours. (Patient taking differently: Take 0.5 mg by mouth every 8 (eight) hours as needed for anxiety.) 30 tablet 1   zolpidem (AMBIEN) 10 MG tablet TAKE 1 TABLET BY MOUTH ONCE DAILY AT BEDTIME AS  NEEDED FOR SLEEP (Patient taking differently: Take 10 mg by mouth at bedtime.) 90 tablet 1   vortioxetine HBr (TRINTELLIX) 20 MG TABS tablet Take 1 tablet by mouth daily. 90 tablet 0   No facility-administered medications prior to visit.    No Known Allergies  Review of Systems  Constitutional:  Positive for fatigue. Negative for chills and fever.  HENT:  Negative for congestion, ear pain, rhinorrhea and  sore throat.   Respiratory:  Negative for cough and shortness of breath.   Cardiovascular:  Negative for chest pain.  Gastrointestinal:  Positive for nausea. Negative for abdominal pain and constipation.  Neurological:  Negative for dizziness, weakness, light-headedness and headaches.  Psychiatric/Behavioral:  Positive for agitation (irritability), decreased concentration, dysphoric mood and sleep disturbance (insomnia). Negative for suicidal ideas. The patient is not nervous/anxious.       Objective:    Physical Exam Vitals reviewed.  Constitutional:      Appearance: Normal appearance.  Cardiovascular:     Rate and Rhythm: Tachycardia present.  Skin:    General: Skin is warm and dry.     Capillary Refill: Capillary refill takes less than 2 seconds.  Neurological:     General: No focal deficit present.     Mental Status: She is alert and oriented to person, place, and time.  Psychiatric:        Mood and Affect: Mood normal.        Behavior: Behavior normal.    Pulse (!) 102    Temp (!) 97.4 F (36.3 C)    Ht '5\' 5"'  (1.651 m)    Wt 155 lb (70.3 kg)    SpO2 99%    BMI 25.79 kg/m  BP (!) 156/98    Pulse (!) 108    Temp (!) 97.4 F (36.3 C)    Ht '5\' 5"'  (1.651 m)    Wt 155 lb (70.3 kg)    SpO2 99%    BMI 25.79 kg/m   Wt Readings from Last 3 Encounters:  11/16/21 155 lb (70.3 kg)  10/15/21 153 lb (69.4 kg)  10/01/21 157 lb 11.2 oz (71.5 kg)    Health Maintenance Due  Topic Date Due   PAP SMEAR-Modifier  11/18/2020   COVID-19 Vaccine (3 - Moderna risk series) 03/09/2021        Lab Results  Component Value Date   TSH 1.680 04/16/2021   Lab Results  Component Value Date   WBC 7.5 09/01/2021   HGB 13.7 09/01/2021   HCT 41.3 09/01/2021   MCV 85.7 09/01/2021   PLT 325 09/01/2021   Lab Results  Component Value Date   NA 141 04/16/2021   K 4.0 04/16/2021   CO2 24 04/16/2021   GLUCOSE 91 04/16/2021   BUN 13 04/16/2021   CREATININE 0.70 04/16/2021   BILITOT 0.5 04/16/2021   ALKPHOS 91 04/16/2021   AST 21 04/16/2021   ALT 27 04/16/2021   PROT 6.3 04/16/2021   ALBUMIN 4.5 04/16/2021   CALCIUM 9.1 04/16/2021   EGFR 111 04/16/2021   Lab Results  Component Value Date   CHOL 199 04/16/2021   Lab Results  Component Value Date   HDL 49 04/16/2021   Lab Results  Component Value Date   LDLCALC 128 (H) 04/16/2021   Lab Results  Component Value Date   TRIG 125 04/16/2021   Lab Results  Component Value Date   CHOLHDL 4.1 04/16/2021        Assessment & Plan:   1. Depression with anxiety - escitalopram (LEXAPRO) 10 MG tablet; Take 1 tablet (10 mg total) by mouth daily.  Dispense: 90 tablet; Refill: 0  2. Elevated BP without diagnosis of hypertension     Begin Lexapro 10 mg daily Follow-up in 4-weeks, fasting labs     Follow-up: 4-weeks  An After Visit Summary was printed and given to the patient.  I, Rip Harbour, NP, have reviewed all  documentation for this visit. The documentation on 11/16/21 for the exam, diagnosis, procedures, and orders are all accurate and complete.    Signed, Rip Harbour, NP [place] [phone]

## 2021-11-16 NOTE — Progress Notes (Signed)
[facility] Spiritual Care Note  [name] reached out via email today, so we set up an appointment in my office following her regular radiation treatment. She described her mood (flat and irritable) and recent visit with her PCP (including Lexapro rx), seeking normalizing of feelings and suggestions of resources that might help.  Encouraged her to loop in Drs Burr Medico (med onc) and Lisbeth Renshaw (rad onc), as well as [facility] Navigators Dawn Stuart/RN and Company secretary Martini/RN for their input.  Additionally, suggested several [facility] and [employer] resources: two free table massages and reiki sessions, online classes ranging from visual art to cooking, Breast Cancer Support Group, and ultimately Finding Your New Normal [facility]) for adjusting to conclusion of treatment and new survivorship identity.  Although enjoyment is harder to experience right now, [name] generally enjoys her son's ballgames, quiet time alone, and family camping trips. Her goal from our conversation is building more time for herself, including taking advantage of support programming such as massage therapy and support group.  Kim plans to reach out by phone or email for follow-up support as needed/desired.   [place], [place], [facility] Pager [phone] Voicemail [phone]

## 2021-11-16 NOTE — Patient Instructions (Signed)
Begin Lexapro 10 mg daily Follow-up in 4-weeks, fasting labs   Managing Depression, Adult Depression is a mental health condition that affects your thoughts, feelings, and actions. Being diagnosed with depression can bring you relief if you did not know why you have felt or behaved a certain way. It could also leave you feeling overwhelmed with uncertainty about your future. Preparing yourself to manage your symptoms can help you feel more positive about your future. How to manage lifestyle changes Managing stress Stress is your body's reaction to life changes and events, both good and bad. Stress can add to your feelings of depression. Learning to manage your stress can help lessen your feelings of depression. Try some of the following approaches to reducing your stress (stress reduction techniques): Listen to music that you enjoy and that inspires you. Try using a meditation app or take a meditation class. Develop a practice that helps you connect with your spiritual self. Walk in nature, pray, or go to a place of worship. Do some deep breathing. To do this, inhale slowly through your nose. Pause at the top of your inhale for a few seconds and then exhale slowly, letting your muscles relax. Practice yoga to help relax and work your muscles. Choose a stress reduction technique that suits your lifestyle and personality. These techniques take time and practice to develop. Set aside 5-15 minutes a day to do them. Therapists can offer training in these techniques. Other things you can do to manage stress include: Keeping a stress diary. Knowing your limits and saying no when you think something is too much. Paying attention to how you react to certain situations. You may not be able to control everything, but you can change your reaction. Adding humor to your life by watching funny films or TV shows. Making time for activities that you enjoy and that relax you.  Medicines Medicines, such as  antidepressants, are often a part of treatment for depression. Talk with your pharmacist or health care provider about all the medicines, supplements, and herbal products that you take, their possible side effects, and what medicines and other products are safe to take together. Make sure to report any side effects you may have to your health care provider. Relationships Your health care provider may suggest family therapy, couples therapy, or individual therapy as part of your treatment. How to recognize changes Everyone responds differently to treatment for depression. As you recover from depression, you may start to: Have more interest in doing activities. Feel less hopeless. Have more energy. Overeat less often, or have a better appetite. Have better mental focus. It is important to recognize if your depression is not getting better or is getting worse. The symptoms you had in the beginning may return, such as: Tiredness (fatigue) or low energy. Eating too much or too little. Sleeping too much or too little. Feeling restless, agitated, or hopeless. Trouble focusing or making decisions. Unexplained physical complaints. Feeling irritable, angry, or aggressive. If you or your family members notice these symptoms coming back, let your health care provider know right away. Follow these instructions at home: Activity  Try to get some form of exercise each day, such as walking, biking, swimming, or lifting weights. Practice stress reduction techniques. Engage your mind by taking a class or doing some volunteer work. Lifestyle Get the right amount and quality of sleep. Cut down on using caffeine, tobacco, alcohol, and other potentially harmful substances. Eat a healthy diet that includes plenty of vegetables, fruits, whole  grains, low-fat dairy products, and lean protein. Do not eat a lot of foods that are high in solid fats, added sugars, or salt (sodium). General instructions Take  over-the-counter and prescription medicines only as told by your health care provider. Keep all follow-up visits as told by your health care provider. This is important. Where to find support Talking to others Friends and family members can be sources of support and guidance. Talk to trusted friends or family members about your condition. Explain your symptoms to them, and let them know that you are working with a health care provider to treat your depression. Tell friends and family members how they also can be helpful. Finances Find appropriate mental health providers that fit with your financial situation. Talk with your health care provider about options to get reduced prices on your medicines. Where to find more information You can find support in your area from: Anxiety and Depression Association of America (ADAA): www.adaa.org Mental Health America: www.mentalhealthamerica.net [employer] on Mental Illness: www.nami.org Contact a health care provider if: You stop taking your antidepressant medicines, and you have any of these symptoms: Nausea. Headache. Light-headedness. Chills and body aches. Not being able to sleep (insomnia). You or your friends and family think your depression is getting worse. Get help right away if: You have thoughts of hurting yourself or others. If you ever feel like you may hurt yourself or others, or have thoughts about taking your own life, get help right away. Go to your nearest emergency department or: Call your local emergency services (911 in the [place] Call a suicide crisis helpline, such as the [place] at 204-322-7615 or 988 in the [place]. This is open 24 hours a day in the [place] Text the Crisis Text Line at 365-716-2970 (in the [place].). Summary If you are diagnosed with depression, preparing yourself to manage your symptoms is a good way to feel positive about your future. Work with your health care provider on a  management plan that includes stress reduction techniques, medicines (if applicable), therapy, and healthy lifestyle habits. Keep talking with your health care provider about how your treatment is working. If you have thoughts about taking your own life, call a suicide crisis helpline or text a crisis text line. This information is not intended to replace advice given to you by your health care provider. Make sure you discuss any questions you have with your health care provider. Document Revised: 04/22/2021 Document Reviewed: 08/08/2019 Elsevier Patient Education  2022 [employer].

## 2021-11-17 ENCOUNTER — Other Ambulatory Visit: Payer: Self-pay

## 2021-11-17 ENCOUNTER — Ambulatory Visit
Admission: RE | Admit: 2021-11-17 | Discharge: 2021-11-17 | Disposition: A | Discharge status: Home / Self Care | Payer: No Typology Code available for payment source | Source: Ambulatory Visit | Attending: Radiation Oncology | Admitting: Radiation Oncology

## 2021-11-17 DIAGNOSIS — Z510 Encounter for antineoplastic radiation therapy: Secondary | ICD-10-CM | POA: Diagnosis not present

## 2021-11-18 ENCOUNTER — Other Ambulatory Visit: Payer: Self-pay

## 2021-11-18 ENCOUNTER — Encounter: Payer: No Typology Code available for payment source | Admitting: Physical Therapy

## 2021-11-18 ENCOUNTER — Ambulatory Visit
Admission: RE | Admit: 2021-11-18 | Discharge: 2021-11-18 | Disposition: A | Discharge status: Home / Self Care | Payer: No Typology Code available for payment source | Source: Ambulatory Visit | Attending: Radiation Oncology | Admitting: Radiation Oncology

## 2021-11-18 DIAGNOSIS — Z510 Encounter for antineoplastic radiation therapy: Secondary | ICD-10-CM | POA: Diagnosis not present

## 2021-11-19 ENCOUNTER — Ambulatory Visit
Admission: RE | Admit: 2021-11-19 | Discharge: 2021-11-19 | Disposition: A | Discharge status: Home / Self Care | Payer: No Typology Code available for payment source | Source: Ambulatory Visit | Attending: Radiation Oncology | Admitting: Radiation Oncology

## 2021-11-19 DIAGNOSIS — Z510 Encounter for antineoplastic radiation therapy: Secondary | ICD-10-CM | POA: Diagnosis not present

## 2021-11-20 ENCOUNTER — Other Ambulatory Visit: Payer: Self-pay

## 2021-11-20 ENCOUNTER — Ambulatory Visit
Admission: RE | Admit: 2021-11-20 | Discharge: 2021-11-20 | Disposition: A | Discharge status: Home / Self Care | Payer: No Typology Code available for payment source | Source: Ambulatory Visit | Attending: Radiation Oncology | Admitting: Radiation Oncology

## 2021-11-20 DIAGNOSIS — Z510 Encounter for antineoplastic radiation therapy: Secondary | ICD-10-CM | POA: Diagnosis not present

## 2021-11-23 ENCOUNTER — Ambulatory Visit
Admission: RE | Admit: 2021-11-23 | Discharge: 2021-11-23 | Disposition: A | Discharge status: Home / Self Care | Payer: No Typology Code available for payment source | Source: Ambulatory Visit | Attending: Radiation Oncology | Admitting: Radiation Oncology

## 2021-11-23 ENCOUNTER — Other Ambulatory Visit: Payer: Self-pay

## 2021-11-23 DIAGNOSIS — Z510 Encounter for antineoplastic radiation therapy: Secondary | ICD-10-CM | POA: Diagnosis not present

## 2021-11-24 ENCOUNTER — Ambulatory Visit
Admission: RE | Admit: 2021-11-24 | Discharge: 2021-11-24 | Disposition: A | Discharge status: Home / Self Care | Payer: No Typology Code available for payment source | Source: Ambulatory Visit | Attending: Radiation Oncology | Admitting: Radiation Oncology

## 2021-11-24 ENCOUNTER — Other Ambulatory Visit: Payer: Self-pay

## 2021-11-24 ENCOUNTER — Other Ambulatory Visit (HOSPITAL_COMMUNITY): Payer: Self-pay

## 2021-11-24 DIAGNOSIS — F900 Attention-deficit hyperactivity disorder, predominantly inattentive type: Secondary | ICD-10-CM

## 2021-11-24 DIAGNOSIS — Z510 Encounter for antineoplastic radiation therapy: Secondary | ICD-10-CM | POA: Diagnosis not present

## 2021-11-24 MED ORDER — AMPHETAMINE-DEXTROAMPHET ER 25 MG PO CP24
ORAL_CAPSULE | Freq: Every morning | ORAL | 0 refills | Status: DC
  Filled 2021-11-24: qty 30, 30d supply, fill #0

## 2021-11-25 ENCOUNTER — Other Ambulatory Visit: Payer: Self-pay

## 2021-11-25 ENCOUNTER — Ambulatory Visit
Admission: RE | Admit: 2021-11-25 | Discharge: 2021-11-25 | Disposition: A | Discharge status: Home / Self Care | Payer: No Typology Code available for payment source | Source: Ambulatory Visit | Attending: Radiation Oncology | Admitting: Radiation Oncology

## 2021-11-25 DIAGNOSIS — Z510 Encounter for antineoplastic radiation therapy: Secondary | ICD-10-CM | POA: Diagnosis not present

## 2021-11-26 ENCOUNTER — Ambulatory Visit
Admission: RE | Admit: 2021-11-26 | Discharge: 2021-11-26 | Disposition: A | Discharge status: Home / Self Care | Payer: No Typology Code available for payment source | Source: Ambulatory Visit | Attending: Radiation Oncology | Admitting: Radiation Oncology

## 2021-11-26 DIAGNOSIS — Z510 Encounter for antineoplastic radiation therapy: Secondary | ICD-10-CM | POA: Diagnosis not present

## 2021-11-27 ENCOUNTER — Other Ambulatory Visit: Payer: Self-pay

## 2021-11-27 ENCOUNTER — Ambulatory Visit
Admission: RE | Admit: 2021-11-27 | Discharge: 2021-11-27 | Disposition: A | Discharge status: Home / Self Care | Payer: No Typology Code available for payment source | Source: Ambulatory Visit | Attending: Radiation Oncology | Admitting: Radiation Oncology

## 2021-11-27 DIAGNOSIS — Z510 Encounter for antineoplastic radiation therapy: Secondary | ICD-10-CM | POA: Diagnosis not present

## 2021-11-30 ENCOUNTER — Other Ambulatory Visit: Payer: Self-pay

## 2021-11-30 ENCOUNTER — Ambulatory Visit
Admission: RE | Admit: 2021-11-30 | Discharge: 2021-11-30 | Disposition: A | Discharge status: Home / Self Care | Payer: No Typology Code available for payment source | Source: Ambulatory Visit | Attending: Radiation Oncology | Admitting: Radiation Oncology

## 2021-11-30 DIAGNOSIS — Z510 Encounter for antineoplastic radiation therapy: Secondary | ICD-10-CM | POA: Diagnosis not present

## 2021-12-01 ENCOUNTER — Ambulatory Visit
Admission: RE | Admit: 2021-12-01 | Discharge: 2021-12-01 | Disposition: A | Discharge status: Home / Self Care | Payer: No Typology Code available for payment source | Source: Ambulatory Visit | Attending: Radiation Oncology | Admitting: Radiation Oncology

## 2021-12-01 ENCOUNTER — Other Ambulatory Visit: Payer: Self-pay

## 2021-12-01 DIAGNOSIS — Z510 Encounter for antineoplastic radiation therapy: Secondary | ICD-10-CM | POA: Diagnosis not present

## 2021-12-02 ENCOUNTER — Ambulatory Visit
Admission: RE | Admit: 2021-12-02 | Discharge: 2021-12-02 | Disposition: A | Discharge status: Home / Self Care | Payer: No Typology Code available for payment source | Source: Ambulatory Visit | Attending: Radiation Oncology | Admitting: Radiation Oncology

## 2021-12-02 DIAGNOSIS — Z510 Encounter for antineoplastic radiation therapy: Secondary | ICD-10-CM | POA: Diagnosis not present

## 2021-12-03 ENCOUNTER — Other Ambulatory Visit: Payer: Self-pay

## 2021-12-03 ENCOUNTER — Ambulatory Visit
Admission: RE | Admit: 2021-12-03 | Discharge: 2021-12-03 | Disposition: A | Discharge status: Home / Self Care | Payer: No Typology Code available for payment source | Source: Ambulatory Visit | Attending: Radiation Oncology | Admitting: Radiation Oncology

## 2021-12-03 DIAGNOSIS — Z510 Encounter for antineoplastic radiation therapy: Secondary | ICD-10-CM | POA: Diagnosis not present

## 2021-12-04 ENCOUNTER — Ambulatory Visit
Admission: RE | Admit: 2021-12-04 | Discharge: 2021-12-04 | Disposition: A | Discharge status: Home / Self Care | Payer: No Typology Code available for payment source | Source: Ambulatory Visit | Attending: Radiation Oncology | Admitting: Radiation Oncology

## 2021-12-04 ENCOUNTER — Other Ambulatory Visit: Payer: Self-pay

## 2021-12-04 ENCOUNTER — Ambulatory Visit: Payer: No Typology Code available for payment source | Admitting: Radiation Oncology

## 2021-12-04 DIAGNOSIS — Z510 Encounter for antineoplastic radiation therapy: Secondary | ICD-10-CM | POA: Diagnosis not present

## 2021-12-07 ENCOUNTER — Other Ambulatory Visit: Payer: Self-pay

## 2021-12-07 ENCOUNTER — Ambulatory Visit
Admission: RE | Admit: 2021-12-07 | Discharge: 2021-12-07 | Disposition: A | Discharge status: Home / Self Care | Payer: No Typology Code available for payment source | Source: Ambulatory Visit | Attending: Radiation Oncology | Admitting: Radiation Oncology

## 2021-12-07 DIAGNOSIS — Z510 Encounter for antineoplastic radiation therapy: Secondary | ICD-10-CM | POA: Diagnosis not present

## 2021-12-08 ENCOUNTER — Ambulatory Visit
Admission: RE | Admit: 2021-12-08 | Discharge: 2021-12-08 | Disposition: A | Discharge status: Home / Self Care | Payer: No Typology Code available for payment source | Source: Ambulatory Visit | Attending: Radiation Oncology | Admitting: Radiation Oncology

## 2021-12-08 ENCOUNTER — Encounter (HOSPITAL_COMMUNITY): Payer: Self-pay

## 2021-12-08 DIAGNOSIS — Z510 Encounter for antineoplastic radiation therapy: Secondary | ICD-10-CM | POA: Diagnosis not present

## 2021-12-09 ENCOUNTER — Ambulatory Visit
Admission: RE | Admit: 2021-12-09 | Discharge: 2021-12-09 | Disposition: A | Discharge status: Home / Self Care | Payer: No Typology Code available for payment source | Source: Ambulatory Visit | Attending: Radiation Oncology | Admitting: Radiation Oncology

## 2021-12-09 DIAGNOSIS — C50111 Malignant neoplasm of central portion of right female breast: Secondary | ICD-10-CM | POA: Diagnosis present

## 2021-12-09 DIAGNOSIS — Z510 Encounter for antineoplastic radiation therapy: Secondary | ICD-10-CM | POA: Insufficient documentation

## 2021-12-09 DIAGNOSIS — C50112 Malignant neoplasm of central portion of left female breast: Secondary | ICD-10-CM | POA: Insufficient documentation

## 2021-12-09 DIAGNOSIS — Z170 Estrogen receptor positive status [ER+]: Secondary | ICD-10-CM | POA: Insufficient documentation

## 2021-12-09 DIAGNOSIS — Z79899 Other long term (current) drug therapy: Secondary | ICD-10-CM | POA: Diagnosis not present

## 2021-12-10 ENCOUNTER — Ambulatory Visit
Admission: RE | Admit: 2021-12-10 | Discharge: 2021-12-10 | Disposition: A | Discharge status: Home / Self Care | Payer: No Typology Code available for payment source | Source: Ambulatory Visit | Attending: Radiation Oncology | Admitting: Radiation Oncology

## 2021-12-10 DIAGNOSIS — Z510 Encounter for antineoplastic radiation therapy: Secondary | ICD-10-CM | POA: Diagnosis not present

## 2021-12-11 ENCOUNTER — Encounter: Payer: Self-pay | Admitting: Hematology

## 2021-12-11 ENCOUNTER — Inpatient Hospital Stay: Payer: No Typology Code available for payment source

## 2021-12-11 ENCOUNTER — Other Ambulatory Visit: Payer: Self-pay

## 2021-12-11 ENCOUNTER — Inpatient Hospital Stay: Payer: No Typology Code available for payment source | Attending: Genetic Counselor | Admitting: Hematology

## 2021-12-11 ENCOUNTER — Ambulatory Visit
Admission: RE | Admit: 2021-12-11 | Discharge: 2021-12-11 | Disposition: A | Discharge status: Home / Self Care | Payer: No Typology Code available for payment source | Source: Ambulatory Visit | Attending: Radiation Oncology | Admitting: Radiation Oncology

## 2021-12-11 ENCOUNTER — Other Ambulatory Visit (HOSPITAL_COMMUNITY): Payer: Self-pay

## 2021-12-11 VITALS — BP 145/96 | HR 86 | Temp 98.4°F | Resp 18 | Ht 65.0 in | Wt 161.0 lb

## 2021-12-11 DIAGNOSIS — C50111 Malignant neoplasm of central portion of right female breast: Secondary | ICD-10-CM | POA: Diagnosis not present

## 2021-12-11 DIAGNOSIS — C50112 Malignant neoplasm of central portion of left female breast: Secondary | ICD-10-CM | POA: Insufficient documentation

## 2021-12-11 DIAGNOSIS — Z170 Estrogen receptor positive status [ER+]: Secondary | ICD-10-CM | POA: Insufficient documentation

## 2021-12-11 DIAGNOSIS — Z510 Encounter for antineoplastic radiation therapy: Secondary | ICD-10-CM | POA: Insufficient documentation

## 2021-12-11 DIAGNOSIS — Z79899 Other long term (current) drug therapy: Secondary | ICD-10-CM | POA: Insufficient documentation

## 2021-12-11 LAB — CBC WITH DIFFERENTIAL (CANCER CENTER ONLY)
Abs Immature Granulocytes: 0.02 10*3/uL (ref 0.00–0.07)
Basophils Absolute: 0 10*3/uL (ref 0.0–0.1)
Basophils Relative: 1 %
Eosinophils Absolute: 0.1 10*3/uL (ref 0.0–0.5)
Eosinophils Relative: 1 %
HCT: 39.1 % (ref 36.0–46.0)
Hemoglobin: 13.5 g/dL (ref 12.0–15.0)
Immature Granulocytes: 0 %
Lymphocytes Relative: 15 %
Lymphs Abs: 0.9 10*3/uL (ref 0.7–4.0)
MCH: 28.4 pg (ref 26.0–34.0)
MCHC: 34.5 g/dL (ref 30.0–36.0)
MCV: 82.3 fL (ref 80.0–100.0)
Monocytes Absolute: 0.4 10*3/uL (ref 0.1–1.0)
Monocytes Relative: 6 %
Neutro Abs: 4.8 10*3/uL (ref 1.7–7.7)
Neutrophils Relative %: 77 %
Platelet Count: 270 10*3/uL (ref 150–400)
RBC: 4.75 MIL/uL (ref 3.87–5.11)
RDW: 12.4 % (ref 11.5–15.5)
WBC Count: 6.2 10*3/uL (ref 4.0–10.5)
nRBC: 0 % (ref 0.0–0.2)

## 2021-12-11 LAB — CMP (CANCER CENTER ONLY)
ALT: 27 U/L (ref 0–44)
AST: 22 U/L (ref 15–41)
Albumin: 4.3 g/dL (ref 3.5–5.0)
Alkaline Phosphatase: 70 U/L (ref 38–126)
Anion gap: 8 (ref 5–15)
BUN: 14 mg/dL (ref 6–20)
CO2: 29 mmol/L (ref 22–32)
Calcium: 9.2 mg/dL (ref 8.9–10.3)
Chloride: 105 mmol/L (ref 98–111)
Creatinine: 0.72 mg/dL (ref 0.44–1.00)
GFR, Estimated: >60 mL/min (ref >60)
Glucose, Bld: 113 mg/dL — ABNORMAL HIGH (ref 70–99)
Potassium: 3.3 mmol/L — ABNORMAL LOW (ref 3.5–5.1)
Sodium: 142 mmol/L (ref 135–145)
Total Bilirubin: 0.7 mg/dL (ref 0.3–1.2)
Total Protein: 6.7 g/dL (ref 6.5–8.1)

## 2021-12-11 MED ORDER — LETROZOLE 2.5 MG PO TABS
2.5000 mg | ORAL_TABLET | Freq: Every day | ORAL | 3 refills | Status: DC
  Filled 2021-12-11: qty 30, 30d supply, fill #0
  Filled 2022-01-24: qty 30, 30d supply, fill #1
  Filled 2022-02-26: qty 30, 30d supply, fill #2
  Filled 2022-03-23: qty 30, 30d supply, fill #3

## 2021-12-11 NOTE — Progress Notes (Signed)
[place]   Telephone:([phone]) [phone] Fax:([phone]) [phone]   Clinic Follow up Note   Patient Care Team: Rochel Brome, MD as PCP - General (Family Medicine) Rockwell Germany, RN as Oncology Nurse Navigator Mauro Kaufmann, RN as Oncology Nurse Navigator Truitt Merle, MD as Consulting Physician (Hematology)  Date of Service:  12/11/2021  CHIEF COMPLAINT: f/u of right breast cancer  CURRENT THERAPY:  Adjuvant radiation  ASSESSMENT & PLAN:  Sheri Moore is a 41 y.o. postmenopausal female with   1. Cancer of central portion of right breast, Stage IA, p(T1c, N0), ER+/PR+/HER2-, Grade 2, RS 22 -found on screening mammogram. Biopsy 08/04/21 confirmed invasive ductal carcinoma with DCIS. -right lumpectomy on 09/10/21 under Dr. Barry Dienes showed 1.2 cm IDC, grade 3, with small foci of DCIS. -Oncotype RS of 22, low risk.  We previously discussed small benefit of adjuvant chemotherapy due to her young age, and if she decides to forego chemotherapy. -she is currently receiving adjuvant radiation under Dr. Lisbeth Renshaw, 11/03/21 - 12/17/21.   -Given the strong ER and PR expression and that she is s/p total hysterectomy and BSO, I recommend adjuvant endocrine therapy with aromatase inhibitor for a total of 7-10 years to reduce the risk of cancer recurrence.  --The potential benefit and side effects, which includes but not limited to, hot flash, skin and vaginal dryness, metabolic changes ( increased blood glucose, cholesterol, weight, etc.), slightly in increased risk of cardiovascular disease, cataracts, muscular and joint discomfort, osteopenia and osteoporosis, etc, were discussed with her in great details. She is interested, and we'll start after she completes radiation. -We also discussed the breast cancer surveillance after her surgery. She will continue annual screening mammogram, self exam, and a routine office visit with lab and exam with [place]. -I encouraged her to have healthy diet and exercise  regularly.  -we will obtain baseline lab work today.   2. Bone Health  -She has never had a DEXA. This is scheduled for 02/12/22.   3. Genetic Testing -she had genetic counseling on 08/19/21. Results are negative with VUS in [place].     PLAN:  -lab today -continue daily radiation through 3/9 -start letrozole in 2-3 weeks, I called in today  -survivorship in 3 months -lab and f/u in 6 months   No problem-specific Assessment & Plan notes found for this encounter.   SUMMARY OF ONCOLOGIC HISTORY: Oncology History Overview Note   Cancer Staging  Cancer of central portion of right breast [facility]) Staging form: Breast, AJCC 8th Edition - Clinical stage from 08/04/2021: Stage IA (cT1b, cN0, cM0, G2, ER+, PR+, HER2-) - Unsigned - Pathologic stage from 09/10/2021: Stage IA (pT1c, pN0, cM0, G2, ER+, PR+, HER2-, Oncotype DX score: 22) - Signed by Truitt Merle, MD on 10/01/2021     Cancer of central portion of right breast ([place])  07/29/2021 Mammogram   EXAM: DIGITAL DIAGNOSTIC UNILATERAL RIGHT MAMMOGRAM WITH TOMOSYNTHESIS AND CAD; ULTRASOUND RIGHT BREAST LIMITED  IMPRESSION: Suspicious mass in the right breast at 9 o'clock measuring 1.0 cm. Targeted ultrasound performed in the right axilla demonstrating normal lymph nodes.   08/04/2021 Initial Biopsy   Diagnosis Breast, right, needle core biopsy, 9 o'clock, ribbon shaped clip - INVASIVE DUCTAL CARCINOMA - DUCTAL CARCINOMA IN SITU - SEE COMMENT Microscopic Comment Based on the biopsy, the carcinoma appears Nottingham grade 2 of 3 and measures 0.9 cm in greatest linear extent.  PROGNOSTIC INDICATORS Results: The tumor cells are EQUIVOCAL for Her2 (2+). Her2 by FISH will be  performed and results reported separately. Estrogen Receptor: 100%, POSITIVE, STRONG STAINING INTENSITY Progesterone Receptor: 95%, POSITIVE, STRONG STAINING INTENSITY Proliferation Marker Ki67: 10%  FLUORESCENCE IN-SITU HYBRIDIZATION Results: GROUP 5: HER2  **NEGATIVE**   09/01/2021 Initial Diagnosis   Cancer of central portion of right breast ([place])   09/08/2021 Genetic Testing   Negative genetic testing on the BRCAPlus panel.  The report date is 08/29/2021.  Negative genetic testing on the CancerNext-Expanded+RNAinsight.  DICER1 p.T43M VUS was identified. The report date is 09/08/2021.  The BRCAPlus gene panel offered by [facility] and includes sequencing and rearrangement analysis for the following 6 genes: BRCA1, BRCA2, CDH1, PALB2, PTEN, and TP53.  The CancerNext-Expanded gene panel offered by [facility] and includes sequencing and rearrangement analysis for the following 77 genes: AIP, ALK, APC*, ATM*, AXIN2, BAP1, BARD1, BLM, BMPR1A, BRCA1*, BRCA2*, BRIP1*, CDC73, CDH1*, CDK4, CDKN1B, CDKN2A, CHEK2*, CTNNA1, DICER1, FANCC, FH, FLCN, GALNT12, KIF1B, LZTR1, MAX, MEN1, MET, MLH1*, MSH2*, MSH3, MSH6*, MUTYH*, NBN, NF1*, NF2, NTHL1, PALB2*, PHOX2B, PMS2*, POT1, PRKAR1A, PTCH1, PTEN*, RAD51C*, RAD51D*, RB1, RECQL, RET, SDHA, SDHAF2, SDHB, SDHC, SDHD, SMAD4, SMARCA4, SMARCB1, SMARCE1, STK11, SUFU, TMEM127, TP53*, TSC1, TSC2, VHL and XRCC2 (sequencing and deletion/duplication); EGFR, EGLN1, HOXB13, KIT, MITF, PDGFRA, POLD1, and POLE (sequencing only); EPCAM and GREM1 (deletion/duplication only). DNA and RNA analyses performed for * genes.    09/10/2021 Cancer Staging   Staging form: Breast, AJCC 8th Edition - Pathologic stage from 09/10/2021: Stage IA (pT1c, pN0, cM0, G2, ER+, PR+, HER2-, Oncotype DX score: 22) - Signed by Truitt Merle, MD on 10/01/2021 Stage prefix: Initial diagnosis Multigene prognostic tests performed: Oncotype DX Recurrence score range: Greater than or equal to 11 Histologic grading system: 3 grade system Residual tumor (R): R0 - None    09/10/2021 Definitive Surgery   FINAL MICROSCOPIC DIAGNOSIS:  A. BREAST, RIGHT, LUMPECTOMY:  Invasive ductal carcinoma with clear margins of resection.  INVASIVE CARCINOMA OF THE BREAST:   Resection  Procedure: Lumpectomy.  Specimen Laterality: Right.  Histologic Type: Invasive ductal carcinoma.  Histologic Grade:       Glandular (Acinar)/Tubular Differentiation: 3 out of possible 3.       Nuclear Pleomorphism: 2 out of possible 3.       Mitotic Rate: 1 out of possible 3.       Overall Grade: 2 out of possible 3 (from a score of 6 out of  possible 9)  Tumor Size: 1.2 cm. x 1.0 cm. x 0.8 cm.  Ductal Carcinoma In Situ: Present. Small foci without comedo necrosis.  Tumor Extent: Limited to breast parenchyma.  Treatment Effect in the Breast: No known presurgical therapy  Margins: All margins are negative for invasive carcinoma.       Distance from Closest Margin (mm): 7 mm.       Specify Closest Margin (required only if <21m): Inferior.  DCIS Margins: Uninvolved by DCIS.       Distance from Closest Margin (mm): 6 mm.       Specify Closest Margin (required only if <161m: Posterior.  Regional Lymph Nodes:       Number of Lymph Nodes Examined: Five (5).       Number of Sentinel Nodes Examined: Five (5).       Number of Lymph Nodes with Macrometastases (>2 mm): None (0).       Number of Lymph Nodes with Micrometastases: None (0).       Number of Lymph Nodes with Isolated Tumor Cells (=0.2 mm or =200 cells): None (  0).       Size of Largest Metastatic Deposit (mm): N.A.       Extranodal Extension: N.A.  Distant Metastasis:       Distant Site(s) Involved: None known.  Breast Biomarker Testing Performed on Previous Biopsy:       Testing Performed on Case Number: [id number] from 08/04/2021.             Estrogen Receptor: 100% (Strong intensity).             Progesterone Receptor: 95% (Strong intensity).             HER2: 2+ by IHC / Negative by FISH.             Ki-67: 10%  Pathologic Stage Classification (pTNM, AJCC 8th Edition): pT1c, pN0  Representative Tumor Block: A3 and A2.  Comment(s): None.   B. LYMPH NODE, RIGHT (5), SENTINEL, EXCISION:  5 lymph nodes with fatty  infiltration which is negative for metastatic  Carcinoma. (0/5)       INTERVAL HISTORY:  Sheri Moore is here for a follow up of breast cancer. She was last seen by me on 10/01/21. She presents to the clinic alone. She reports she is tolerating radiation well overall but has developed burning/redness.   All other systems were reviewed with the patient and are negative.  MEDICAL HISTORY:  Past Medical History:  Diagnosis Date   Anxiety    Breast cancer ([place]) 07/06/21   Right Breast, first seen on mammogram done 07/06/21   Depression    Family history of breast cancer    Family history of melanoma    Family history of uterine cancer    Fibroid    Gastroschisis     SURGICAL HISTORY: Past Surgical History:  Procedure Laterality Date   ABDOMINAL HYSTERECTOMY     supracervical hysterectomy   ABDOMINAL SURGERY     as a newborn, gastrogheschesis   BREAST LUMPECTOMY WITH RADIOACTIVE SEED AND SENTINEL LYMPH NODE BIOPSY Right 09/10/2021   Procedure: RIGHT BREAST LUMPECTOMY WITH RADIOACTIVE SEED AND SENTINEL LYMPH NODE BIOPSY;  Surgeon: Stark Klein, MD;  Location: [facility];  Service: General;  Laterality: Right;   OOPHORECTOMY      I have reviewed the social history and family history with the patient and they are unchanged from previous note.  ALLERGIES:  has No Known Allergies.  MEDICATIONS:  Current Outpatient Medications  Medication Sig Dispense Refill   letrozole (FEMARA) 2.5 MG tablet Take 1 tablet (2.5 mg total) by mouth daily. 30 tablet 3   amphetamine-dextroamphetamine (ADDERALL XR) 25 MG 24 hr capsule TAKE 1 CAPSULE BY MOUTH EVERY MORNING. 30 capsule 0   escitalopram (LEXAPRO) 10 MG tablet Take 1 tablet (10 mg total) by mouth daily. 90 tablet 0   fluticasone (FLONASE) 50 MCG/ACT nasal spray Place 2 sprays into both nostrils daily.     loratadine (CLARITIN) 10 MG tablet Take 10 mg by mouth daily.     LORazepam (ATIVAN) 0.5 MG tablet Take 1 tablet (0.5 mg total) by mouth  every 8 (eight) hours. (Patient taking differently: Take 0.5 mg by mouth every 8 (eight) hours as needed for anxiety.) 30 tablet 1   zolpidem (AMBIEN) 10 MG tablet TAKE 1 TABLET BY MOUTH ONCE DAILY AT BEDTIME AS NEEDED FOR SLEEP (Patient taking differently: Take 10 mg by mouth at bedtime.) 90 tablet 1   No current facility-administered medications for this visit.    PHYSICAL EXAMINATION: ECOG PERFORMANCE STATUS: 1 -  Symptomatic but completely ambulatory  Vitals:   12/11/21 1534  BP: (!) 145/96  Pulse: 86  Resp: 18  Temp: 98.4 F (36.9 C)  SpO2: 98%   Wt Readings from Last 3 Encounters:  12/11/21 161 lb (73 kg)  11/16/21 155 lb (70.3 kg)  10/15/21 153 lb (69.4 kg)     GENERAL:alert, no distress and comfortable SKIN: skin color normal, no rashes or significant lesions EYES: normal, Conjunctiva are pink and non-injected, sclera clear  NEURO: alert & oriented x 3 with fluent speech  LABORATORY DATA:  I have reviewed the data as listed CBC Latest Ref Rng & Units 12/11/2021 09/01/2021 04/16/2021  WBC 4.0 - 10.5 K/uL 6.2 7.5 5.4  Hemoglobin 12.0 - 15.0 g/dL 13.5 13.7 14.0  Hematocrit 36.0 - 46.0 % 39.1 41.3 40.6  Platelets 150 - 400 K/uL 270 325 293     CMP Latest Ref Rng & Units 12/11/2021 04/16/2021 12/29/2020  Glucose 70 - 99 mg/dL 113(H) 91 97  BUN 6 - 20 mg/dL _0 Creatinine 0.44 - 1.00 mg/dL 0.72 0.70 0.69  Sodium 135 - 145 mmol/L 142 141 141  Potassium 3.5 - 5.1 mmol/L 3.3(L) 4.0 4.5  Chloride 98 - 111 mmol/L 105 100 104  CO2 22 - 32 mmol/L _1 Calcium 8.9 - 10.3 mg/dL 9.2 9.1 9.0  Total Protein 6.5 - 8.1 g/dL 6.7 6.3 6.5  Total Bilirubin 0.3 - 1.2 mg/dL 0.7 0.5 0.6  Alkaline Phos 38 - 126 U/L 70 91 85  AST 15 - 41 U/L _2 ALT 0 - 44 U/L 27 27 33(H)      RADIOGRAPHIC STUDIES: I have personally reviewed the radiological images as listed and agreed with the findings in the report. No results found.    No orders of the defined types were placed in  this encounter.  All questions were answered. The patient knows to call the clinic with any problems, questions or concerns. No barriers to learning was detected. The total time spent in the appointment was 30 minutes.     Truitt Merle, MD 12/11/2021   I, Wilburn Mylar, am acting as scribe for Truitt Merle, MD.   I have reviewed the above documentation for accuracy and completeness, and I agree with the above.

## 2021-12-14 ENCOUNTER — Ambulatory Visit
Admission: RE | Admit: 2021-12-14 | Discharge: 2021-12-14 | Disposition: A | Discharge status: Home / Self Care | Payer: No Typology Code available for payment source | Source: Ambulatory Visit | Attending: Radiation Oncology | Admitting: Radiation Oncology

## 2021-12-14 ENCOUNTER — Other Ambulatory Visit: Payer: Self-pay

## 2021-12-14 ENCOUNTER — Telehealth: Payer: Self-pay | Admitting: Hematology

## 2021-12-14 ENCOUNTER — Other Ambulatory Visit (HOSPITAL_COMMUNITY): Payer: Self-pay

## 2021-12-14 DIAGNOSIS — Z510 Encounter for antineoplastic radiation therapy: Secondary | ICD-10-CM | POA: Diagnosis not present

## 2021-12-14 MED ORDER — POTASSIUM CHLORIDE CRYS ER 10 MEQ PO TBCR
10.0000 meq | EXTENDED_RELEASE_TABLET | Freq: Every day | ORAL | 0 refills | Status: DC
  Filled 2021-12-14: qty 5, 5d supply, fill #0

## 2021-12-14 NOTE — Progress Notes (Signed)
Verbal order from Dr. Burr Medico for Potassium Chloride 48mq PO daily for 5 days.  Prescription entered. ?

## 2021-12-14 NOTE — Telephone Encounter (Signed)
Scheduled follow-up appointments per 3/3 los. Patient is aware. 

## 2021-12-15 ENCOUNTER — Ambulatory Visit
Admission: RE | Admit: 2021-12-15 | Discharge: 2021-12-15 | Disposition: A | Discharge status: Home / Self Care | Payer: No Typology Code available for payment source | Source: Ambulatory Visit | Attending: Radiation Oncology | Admitting: Radiation Oncology

## 2021-12-15 DIAGNOSIS — Z510 Encounter for antineoplastic radiation therapy: Secondary | ICD-10-CM | POA: Diagnosis not present

## 2021-12-16 ENCOUNTER — Ambulatory Visit
Admission: RE | Admit: 2021-12-16 | Discharge: 2021-12-16 | Disposition: A | Discharge status: Home / Self Care | Payer: No Typology Code available for payment source | Source: Ambulatory Visit | Attending: Radiation Oncology | Admitting: Radiation Oncology

## 2021-12-16 DIAGNOSIS — Z510 Encounter for antineoplastic radiation therapy: Secondary | ICD-10-CM | POA: Diagnosis not present

## 2021-12-17 ENCOUNTER — Ambulatory Visit
Admission: RE | Admit: 2021-12-17 | Discharge: 2021-12-17 | Disposition: A | Discharge status: Home / Self Care | Payer: No Typology Code available for payment source | Source: Ambulatory Visit | Attending: Radiation Oncology | Admitting: Radiation Oncology

## 2021-12-17 ENCOUNTER — Encounter: Payer: Self-pay | Admitting: Radiation Oncology

## 2021-12-17 ENCOUNTER — Other Ambulatory Visit: Payer: Self-pay

## 2021-12-17 ENCOUNTER — Encounter: Payer: Self-pay | Admitting: *Deleted

## 2021-12-17 DIAGNOSIS — Z510 Encounter for antineoplastic radiation therapy: Secondary | ICD-10-CM | POA: Diagnosis not present

## 2021-12-17 DIAGNOSIS — C50111 Malignant neoplasm of central portion of right female breast: Secondary | ICD-10-CM

## 2021-12-23 ENCOUNTER — Other Ambulatory Visit: Payer: Self-pay | Admitting: Nurse Practitioner

## 2021-12-23 ENCOUNTER — Encounter: Payer: Self-pay | Admitting: Nurse Practitioner

## 2021-12-23 ENCOUNTER — Ambulatory Visit (INDEPENDENT_AMBULATORY_CARE_PROVIDER_SITE_OTHER): Payer: No Typology Code available for payment source

## 2021-12-23 ENCOUNTER — Ambulatory Visit (INDEPENDENT_AMBULATORY_CARE_PROVIDER_SITE_OTHER): Payer: No Typology Code available for payment source | Admitting: Nurse Practitioner

## 2021-12-23 VITALS — BP 132/84 | HR 114 | Wt 157.6 lb

## 2021-12-23 DIAGNOSIS — R000 Tachycardia, unspecified: Secondary | ICD-10-CM

## 2021-12-23 MED ORDER — PROPRANOLOL HCL 20 MG PO TABS: 20.0000 mg | ORAL_TABLET | Freq: Three times a day (TID) | ORAL | 1 refills | Status: DC

## 2021-12-23 NOTE — Progress Notes (Unsigned)
Enrolled for [employer] to mail a ZIO XT long term holter monitor to the patients address on file.  

## 2021-12-23 NOTE — Progress Notes (Signed)
Acute Office Visit  Subjective:    Patient ID: Sheri Moore, female    DOB: 1980-10-14, 41 y.o.   MRN: [id number]  CC tachycardia  HPI Patient is in today for evaluation of tachycardia. Onset of symptoms was a few days ago. Episodes occur intermittently while she is at rest. Associated symptoms include fatigue and "inability to take a deep breath".Symptoms are not aggravated or alleviated by anything. She denies new medications, caffeine consumption, or taking stimulants. Denies chest pain, dyspnea, or syncope. Pt completed radiation for breast cancer last week. She was found to have mild hypokalemia per labs on 12/11/21. She was treated with oral potassium supplement and potassium rich diet. Family history of hypertension with mother and father.  Past Medical History:  Diagnosis Date   Anxiety    Breast cancer (HCC) 07/06/21   Right Breast, first seen on mammogram done 07/06/21   Depression    Family history of breast cancer    Family history of melanoma    Family history of uterine cancer    Fibroid    Gastroschisis     Past Surgical History:  Procedure Laterality Date   ABDOMINAL HYSTERECTOMY     supracervical hysterectomy   ABDOMINAL SURGERY     as a newborn, gastrogheschesis   BREAST LUMPECTOMY WITH RADIOACTIVE SEED AND SENTINEL LYMPH NODE BIOPSY Right 09/10/2021   Procedure: RIGHT BREAST LUMPECTOMY WITH RADIOACTIVE SEED AND SENTINEL LYMPH NODE BIOPSY;  Surgeon: Almond Lint, MD;  Location: [facility];  Service: General;  Laterality: Right;   OOPHORECTOMY      Family History  Problem Relation Age of Onset   Uterine cancer Mother 32   Diabetes Mother    Hypertension Mother    Thyroid disease Mother    Obesity Mother    Subarachnoid hemorrhage Father    Melanoma Father 58   Diabetes Father    Diabetes Sister    Obesity Sister    Diabetes Maternal Grandmother    Hypertension Maternal Grandmother    Melanoma Paternal Grandfather 24   Diabetes Paternal Grandfather     Breast cancer Other 27       MGMs sister   Breast cancer Other 51       MGF's sister   Breast cancer Other        dx < 50; PGMs sister    Social History   Socioeconomic History   Marital status: Married    Spouse name: Not on file   Number of children: 1   Years of education: College   Highest education level: Not on file  Occupational History   Occupation: [employer]  Tobacco Use   Smoking status: Former    Packs/day: 0.25    Years: 3.00    Pack years: 0.75    Types: Cigarettes    Quit date: 10/11/2001    Years since quitting: 20.2   Smokeless tobacco: Former  Building services engineer Use: Never used  Substance and Sexual Activity   Alcohol use: Not Currently    Comment: occ   Drug use: No   Sexual activity: Yes    Partners: Female    Birth control/protection: Surgical    Comment: supracervical hysterectomy  Other Topics Concern   Not on file  Social History Narrative   Lives with husband and son   Caffeine use: rarely   Right handed    Social Determinants of Health   Financial Resource Strain: Not on file  Food Insecurity: Not  on file  Transportation Needs: Not on file  Physical Activity: Not on file  Stress: Not on file  Social Connections: Not on file  Intimate Partner Violence: Not At Risk   Fear of Current or Ex-Partner: No   Emotionally Abused: No   Physically Abused: No   Sexually Abused: No    Outpatient Medications Prior to Visit  Medication Sig Dispense Refill   amphetamine-dextroamphetamine (ADDERALL XR) 25 MG 24 hr capsule TAKE 1 CAPSULE BY MOUTH EVERY MORNING. 30 capsule 0   escitalopram (LEXAPRO) 10 MG tablet Take 1 tablet (10 mg total) by mouth daily. 90 tablet 0   fluticasone (FLONASE) 50 MCG/ACT nasal spray Place 2 sprays into both nostrils daily.     letrozole (FEMARA) 2.5 MG tablet Take 1 tablet (2.5 mg total) by mouth daily. 30 tablet 3   loratadine (CLARITIN) 10 MG tablet Take 10 mg by mouth daily.     LORazepam (ATIVAN) 0.5  MG tablet Take 1 tablet (0.5 mg total) by mouth every 8 (eight) hours. (Patient taking differently: Take 0.5 mg by mouth every 8 (eight) hours as needed for anxiety.) 30 tablet 1   potassium chloride (KLOR-CON M) 10 MEQ tablet Take 1 tablet (10 mEq total) by mouth daily. 5 tablet 0   zolpidem (AMBIEN) 10 MG tablet TAKE 1 TABLET BY MOUTH ONCE DAILY AT BEDTIME AS NEEDED FOR SLEEP (Patient taking differently: Take 10 mg by mouth at bedtime.) 90 tablet 1   No facility-administered medications prior to visit.    No Known Allergies  Review of Systems  Constitutional:  Positive for fatigue. Negative for appetite change and unexpected weight change.  HENT:  Negative for congestion, ear pain, rhinorrhea, sinus pressure, sinus pain and tinnitus.   Eyes:  Negative for pain.  Respiratory:  Positive for shortness of breath. Negative for cough.   Cardiovascular:  Positive for palpitations. Negative for chest pain and leg swelling.  Gastrointestinal:  Negative for abdominal pain, constipation, diarrhea, nausea and vomiting.  Endocrine: Negative for cold intolerance, heat intolerance, polydipsia, polyphagia and polyuria.  Genitourinary:  Negative for dysuria, frequency and hematuria.  Musculoskeletal:  Negative for arthralgias, back pain, joint swelling and myalgias.  Skin:  Negative for rash.  Allergic/Immunologic: Negative for environmental allergies.  Neurological:  Negative for dizziness and headaches.  Hematological:  Negative for adenopathy.  Psychiatric/Behavioral:  Negative for decreased concentration and sleep disturbance. The patient is not nervous/anxious.       Objective:    Physical Exam Vitals reviewed.  Constitutional:      Appearance: Normal appearance.  Cardiovascular:     Rate and Rhythm: Regular rhythm. Tachycardia present.     Pulses: Normal pulses.     Heart sounds: Normal heart sounds.  Pulmonary:     Effort: Pulmonary effort is normal.     Breath sounds: Normal breath  sounds.  Abdominal:     General: Bowel sounds are normal.     Palpations: Abdomen is soft.  Skin:    General: Skin is warm and dry.     Capillary Refill: Capillary refill takes less than 2 seconds.  Neurological:     General: No focal deficit present.     Mental Status: She is alert and oriented to person, place, and time.  Psychiatric:        Mood and Affect: Mood normal.        Behavior: Behavior normal.    BP 132/84   Pulse (!) 114   Wt 157 lb 9.6  oz (71.5 kg)   SpO2 98%   BMI 26.23 kg/m   Wt Readings from Last 3 Encounters:  12/11/21 161 lb (73 kg)  11/16/21 155 lb (70.3 kg)  10/15/21 153 lb (69.4 kg)    Health Maintenance Due  Topic Date Due   PAP SMEAR-Modifier  11/18/2020   COVID-19 Vaccine (3 - Moderna risk series) 03/09/2021       Lab Results  Component Value Date   TSH 1.680 04/16/2021   Lab Results  Component Value Date   WBC 6.2 12/11/2021   HGB 13.5 12/11/2021   HCT 39.1 12/11/2021   MCV 82.3 12/11/2021   PLT 270 12/11/2021   Lab Results  Component Value Date   NA 142 12/11/2021   K 3.3 (L) 12/11/2021   CO2 29 12/11/2021   GLUCOSE 113 (H) 12/11/2021   BUN 14 12/11/2021   CREATININE 0.72 12/11/2021   BILITOT 0.7 12/11/2021   ALKPHOS 70 12/11/2021   AST 22 12/11/2021   ALT 27 12/11/2021   PROT 6.7 12/11/2021   ALBUMIN 4.3 12/11/2021   CALCIUM 9.2 12/11/2021   ANIONGAP 8 12/11/2021   EGFR 111 04/16/2021   Lab Results  Component Value Date   CHOL 199 04/16/2021   Lab Results  Component Value Date   HDL 49 04/16/2021   Lab Results  Component Value Date   LDLCALC 128 (H) 04/16/2021   Lab Results  Component Value Date   TRIG 125 04/16/2021   Lab Results  Component Value Date   CHOLHDL 4.1 04/16/2021        Assessment & Plan:   1. Tachycardia - EKG 12-Lead - propranolol (INDERAL) 20 MG tablet; Take 1 tablet (20 mg total) by mouth 3 (three) times daily.  Dispense: 90 tablet; Refill: 1 - TSH -Holter monitor   Meds  ordered this encounter  Medications   propranolol (INDERAL) 20 MG tablet    Sig: Take 1 tablet (20 mg total) by mouth 3 (three) times daily.    Dispense:  90 tablet    Refill:  1    Order Specific Question:   Supervising Provider    AnswerCorey Harold   Follow-up: pending cardiac monitor and lab results  I, Janie Morning, NP, have reviewed all documentation for this visit. The documentation on 12/23/21 for the exam, diagnosis, procedures, and orders are all accurate and complete.    Signed, Janie Morning, NP

## 2021-12-24 LAB — TSH: TSH: 1.03 u[IU]/mL (ref 0.450–4.500)

## 2021-12-28 DIAGNOSIS — R000 Tachycardia, unspecified: Secondary | ICD-10-CM | POA: Diagnosis not present

## 2021-12-31 NOTE — Progress Notes (Signed)
? ?                                                                                                                                                          ?  Patient Name: Sheri Moore ?MRN: [id number] ?DOB: 13-Apr-1981 ?Referring Physician: Stark Klein (Profile Not Attached) ?Date of Service: 12/17/2021 ?[facility] [place] ? ?                                                      End Of Treatment Note ? ?Diagnoses: C50.111-Malignant neoplasm of central portion of right female breast ? ?Cancer Staging:  Stage IA, pT1cN0M0 grade 2, ER/PR positive invasive ductal carcinoma of the right breast ? ?Intent: Curative ? ?Radiation Treatment Dates: 11/03/2021 through 12/17/2021 ?Site Technique Total Dose (Gy) Dose per Fx (Gy) Completed Fx Beam Energies  ?Breast, Right: Breast_R 3D 50.4/50.4 1.8 28/28 6X  ?Breast, Right: Breast_R_Bst 3D 10/10 2 5/5 6X  ? ?Narrative: The patient tolerated radiation therapy relatively well. She developed fatigue and anticipated skin changes in the treatment field.  ? ?Plan: The patient will receive a call in about one month from the radiation oncology department. She will continue follow up with Dr. Burr Medico as well.  ? ?________________________________________________ ? ? ? ?Carola Rhine, PAC  ?

## 2022-01-02 ENCOUNTER — Other Ambulatory Visit: Payer: Self-pay | Admitting: Nurse Practitioner

## 2022-01-02 DIAGNOSIS — F900 Attention-deficit hyperactivity disorder, predominantly inattentive type: Secondary | ICD-10-CM

## 2022-01-03 MED ORDER — AMPHETAMINE-DEXTROAMPHET ER 25 MG PO CP24
ORAL_CAPSULE | Freq: Every morning | ORAL | 0 refills | Status: DC
  Filled 2022-01-03: qty 30, 30d supply, fill #0

## 2022-01-04 ENCOUNTER — Other Ambulatory Visit (HOSPITAL_COMMUNITY): Payer: Self-pay

## 2022-01-18 ENCOUNTER — Ambulatory Visit
Admission: RE | Admit: 2022-01-18 | Discharge: 2022-01-18 | Disposition: A | Discharge status: Home / Self Care | Payer: No Typology Code available for payment source | Source: Ambulatory Visit | Attending: Adult Health | Admitting: Adult Health

## 2022-01-18 DIAGNOSIS — C50411 Malignant neoplasm of upper-outer quadrant of right female breast: Secondary | ICD-10-CM

## 2022-01-19 NOTE — Progress Notes (Signed)
?  Radiation Oncology         [phone] ?________________________________ ? ?Name: Sheri Moore MRN: [id number]  ?Date of Service: 01/18/2022  DOB: 1981/04/12 ? ?Post Treatment Telephone Note ? ?Diagnosis:   Stage IA, pT1cN0M0 grade 2, ER/PR positive invasive ductal carcinoma of the right breast ? ?Intent: Curative ? ?Radiation Treatment Dates: 11/03/2021 through 12/17/2021 ?Site Technique Total Dose (Gy) Dose per Fx (Gy) Completed Fx Beam Energies  ?Breast, Right: Breast_R 3D 50.4/50.4 1.8 28/28 6X  ?Breast, Right: Breast_R_Bst 3D 10/10 2 5/5 6X  ? ?Narrative: The patient tolerated radiation therapy relatively well. She developed fatigue and anticipated skin changes in the treatment field.  ? ? ?Impression/Plan: ?1. Stage IA, pT1cN0M0 grade 2, ER/PR positive invasive ductal carcinoma of the right breast.  I was unable to reach the patient but left a voicemail and on the message, I discussed that we would be happy to continue to follow her as needed, but she will also continue to follow up with Dr. Burr Medico in medical oncology. She was counseled to call if she had questions about skin care or measures to avoid sun exposure to this area.  ? ? ? ? ? ?Carola Rhine, PAC  ? ? ? ? ?

## 2022-01-24 ENCOUNTER — Other Ambulatory Visit: Payer: Self-pay | Admitting: Family Medicine

## 2022-01-24 ENCOUNTER — Other Ambulatory Visit: Payer: Self-pay | Admitting: Nurse Practitioner

## 2022-01-24 DIAGNOSIS — F900 Attention-deficit hyperactivity disorder, predominantly inattentive type: Secondary | ICD-10-CM

## 2022-01-25 ENCOUNTER — Other Ambulatory Visit (HOSPITAL_COMMUNITY): Payer: Self-pay

## 2022-01-25 MED ORDER — ZOLPIDEM TARTRATE 10 MG PO TABS
ORAL_TABLET | ORAL | 1 refills | Status: DC
  Filled 2022-01-25: qty 90, 90d supply, fill #0

## 2022-01-25 MED ORDER — AMPHETAMINE-DEXTROAMPHET ER 25 MG PO CP24
ORAL_CAPSULE | Freq: Every morning | ORAL | 0 refills | Status: DC
  Filled 2022-01-25: qty 30, fill #0
  Filled 2022-02-01: qty 30, 30d supply, fill #0

## 2022-01-28 ENCOUNTER — Other Ambulatory Visit (HOSPITAL_COMMUNITY): Payer: Self-pay

## 2022-02-01 ENCOUNTER — Other Ambulatory Visit (HOSPITAL_COMMUNITY): Payer: Self-pay

## 2022-02-08 ENCOUNTER — Ambulatory Visit (INDEPENDENT_AMBULATORY_CARE_PROVIDER_SITE_OTHER): Payer: No Typology Code available for payment source | Admitting: Family Medicine

## 2022-02-08 ENCOUNTER — Encounter: Payer: Self-pay | Admitting: Family Medicine

## 2022-02-08 VITALS — BP 118/78 | HR 88 | Temp 97.8°F | Resp 15 | Ht 65.0 in | Wt 158.0 lb

## 2022-02-08 DIAGNOSIS — M79645 Pain in left finger(s): Secondary | ICD-10-CM | POA: Insufficient documentation

## 2022-02-08 MED ORDER — TRIAMCINOLONE ACETONIDE 40 MG/ML IJ SUSP
20.0000 mg | Freq: Once | INTRAMUSCULAR | Status: AC
  Administered 2022-02-08: 20 mg via INTRA_ARTICULAR

## 2022-02-08 NOTE — Assessment & Plan Note (Signed)
Risks were discussed including bleeding, infection, increase in sugars if diabetic, atrophy at site of injection, and increased pain.  After consent was obtained, using sterile technique the knee was prepped with alcohol.  The joint was entered and Kenalog 20 mg and 0.5 ml plain Lidocaine was then injected and the needle withdrawn.  The procedure was well tolerated.   ?The patient is asked to continue to rest the joint for a few more days before resuming regular activities.  It may be more painful for the first 1-2 days.  Watch for fever, or increased swelling or persistent pain in the joint. Call or return to clinic prn if such symptoms occur or there is failure to improve as anticipated. ?

## 2022-02-08 NOTE — Progress Notes (Signed)
? ?Acute Office Visit ? ?Subjective:  ? ? Patient ID: Sheri Moore, female    DOB: 31-Jul-1981, 41 y.o.   MRN: [id number] ? ?Chief Complaint  ?Patient presents with  ? left thumb pain  ? ? ?HPI: ?Patient is in today for left thumb pain at the MTP joint. Patient has had for a few days. Ibuprofen and tylenol have not helped.  ? ?Past Medical History:  ?Diagnosis Date  ? Anxiety   ? Breast cancer ([place]) 07/06/21  ? Right Breast, first seen on mammogram done 07/06/21  ? Depression   ? Family history of breast cancer   ? Family history of melanoma   ? Family history of uterine cancer   ? Fibroid   ? Gastroschisis   ? ? ?Past Surgical History:  ?Procedure Laterality Date  ? ABDOMINAL HYSTERECTOMY    ? supracervical hysterectomy  ? ABDOMINAL SURGERY    ? as a newborn, gastrogheschesis  ? BREAST LUMPECTOMY WITH RADIOACTIVE SEED AND SENTINEL LYMPH NODE BIOPSY Right 09/10/2021  ? Procedure: RIGHT BREAST LUMPECTOMY WITH RADIOACTIVE SEED AND SENTINEL LYMPH NODE BIOPSY;  Surgeon: Stark Klein, MD;  Location: [place];  Service: General;  Laterality: Right;  ? OOPHORECTOMY    ? ? ?Family History  ?Problem Relation Age of Onset  ? Uterine cancer Mother 19  ? Diabetes Mother   ? Hypertension Mother   ? Thyroid disease Mother   ? Obesity Mother   ? Subarachnoid hemorrhage Father   ? Melanoma Father 51  ? Diabetes Father   ? Diabetes Sister   ? Obesity Sister   ? Diabetes Maternal Grandmother   ? Hypertension Maternal Grandmother   ? Melanoma Paternal Grandfather 71  ? Diabetes Paternal Grandfather   ? Breast cancer Other 61  ?     MGMs sister  ? Breast cancer Other 40  ?     MGF's sister  ? Breast cancer Other   ?     dx < 69; PGMs sister  ? ? ?Social History  ? ?Socioeconomic History  ? Marital status: Married  ?  Spouse name: Not on file  ? Number of children: 1  ? Years of education: College  ? Highest education level: Not on file  ?Occupational History  ? Occupation: [employer]  ?Tobacco Use  ? Smoking status: Former  ?   Packs/day: 0.25  ?  Years: 3.00  ?  Pack years: 0.75  ?  Types: Cigarettes  ?  Quit date: 10/11/2001  ?  Years since quitting: 20.3  ? Smokeless tobacco: Former  ?Vaping Use  ? Vaping Use: Never used  ?Substance and Sexual Activity  ? Alcohol use: Not Currently  ?  Comment: occ  ? Drug use: No  ? Sexual activity: Yes  ?  Partners: Female  ?  Birth control/protection: Surgical  ?  Comment: supracervical hysterectomy  ?Other Topics Concern  ? Not on file  ?Social History Narrative  ? Lives with husband and son  ? Caffeine use: rarely  ? Right handed   ? ?Social Determinants of Health  ? ?Financial Resource Strain: Not on file  ?Food Insecurity: Not on file  ?Transportation Needs: Not on file  ?Physical Activity: Not on file  ?Stress: Not on file  ?Social Connections: Not on file  ?Intimate Partner Violence: Not At Risk  ? Fear of Current or Ex-Partner: No  ? Emotionally Abused: No  ? Physically Abused: No  ? Sexually Abused: No  ? ? ?  Outpatient Medications Prior to Visit  ?Medication Sig Dispense Refill  ? amphetamine-dextroamphetamine (ADDERALL XR) 25 MG 24 hr capsule TAKE 1 CAPSULE BY MOUTH EVERY MORNING. 30 capsule 0  ? fluticasone (FLONASE) 50 MCG/ACT nasal spray Place 2 sprays into both nostrils daily.    ? letrozole (FEMARA) 2.5 MG tablet Take 1 tablet (2.5 mg total) by mouth daily. 30 tablet 3  ? loratadine (CLARITIN) 10 MG tablet Take 10 mg by mouth daily.    ? LORazepam (ATIVAN) 0.5 MG tablet Take 1 tablet (0.5 mg total) by mouth every 8 (eight) hours. (Patient taking differently: Take 0.5 mg by mouth every 8 (eight) hours as needed for anxiety.) 30 tablet 1  ? propranolol (INDERAL) 20 MG tablet Take 1 tablet (20 mg total) by mouth 3 (three) times daily. 90 tablet 1  ? zolpidem (AMBIEN) 10 MG tablet TAKE 1 TABLET BY MOUTH ONCE DAILY AT BEDTIME AS NEEDED FOR SLEEP 90 tablet 1  ? escitalopram (LEXAPRO) 10 MG tablet Take 1 tablet (10 mg total) by mouth daily. 90 tablet 0  ? ?No facility-administered medications  prior to visit.  ? ? ?No Known Allergies ? ?Review of Systems  ?Musculoskeletal:  Negative for arthralgias (no other joint pain).  ? ?   ?Objective:  ?  ?Physical Exam ?Vitals reviewed.  ?Constitutional:   ?   Appearance: Normal appearance.  ?Musculoskeletal:     ?   General: Tenderness (compression of left mtp causes discomfort. not triggering.) present.  ?Neurological:  ?   Mental Status: She is alert.  ? ? ?BP 118/78   Pulse 88   Temp 97.8 ?F (36.6 ?C)   Resp 15   Ht $R'5\' 5"'Ii$  (1.651 m)   Wt 158 lb (71.7 kg)   BMI 26.29 kg/m?  ?Wt Readings from Last 3 Encounters:  ?02/19/22 158 lb (71.7 kg)  ?02/08/22 158 lb (71.7 kg)  ?12/23/21 157 lb 9.6 oz (71.5 kg)  ? ? ?Health Maintenance Due  ?Topic Date Due  ? PAP SMEAR-Modifier  11/18/2020  ? COVID-19 Vaccine (3 - Moderna risk series) 03/09/2021  ? ? ?There are no preventive care reminders to display for this patient. ? ? ?Lab Results  ?Component Value Date  ? TSH 1.030 12/23/2021  ? ?Lab Results  ?Component Value Date  ? WBC 6.2 12/11/2021  ? HGB 13.5 12/11/2021  ? HCT 39.1 12/11/2021  ? MCV 82.3 12/11/2021  ? PLT 270 12/11/2021  ? ?Lab Results  ?Component Value Date  ? NA 142 12/11/2021  ? K 3.3 (L) 12/11/2021  ? CO2 29 12/11/2021  ? GLUCOSE 113 (H) 12/11/2021  ? BUN 14 12/11/2021  ? CREATININE 0.72 12/11/2021  ? BILITOT 0.7 12/11/2021  ? ALKPHOS 70 12/11/2021  ? AST 22 12/11/2021  ? ALT 27 12/11/2021  ? PROT 6.7 12/11/2021  ? ALBUMIN 4.3 12/11/2021  ? CALCIUM 9.2 12/11/2021  ? ANIONGAP 8 12/11/2021  ? EGFR 111 04/16/2021  ? ?Lab Results  ?Component Value Date  ? CHOL 199 04/16/2021  ? ?Lab Results  ?Component Value Date  ? HDL 49 04/16/2021  ? ?Lab Results  ?Component Value Date  ? LDLCALC 128 (H) 04/16/2021  ? ?Lab Results  ?Component Value Date  ? TRIG 125 04/16/2021  ? ?Lab Results  ?Component Value Date  ? CHOLHDL 4.1 04/16/2021  ? ?No results found for: HGBA1C ? ?   ?Assessment & Plan:  ? ?Problem List Items Addressed This Visit   ? ?  ? Other  ?  Thumb pain,  left - Primary  ?  Risks were discussed including bleeding, infection, increase in sugars if diabetic, atrophy at site of injection, and increased pain.  After consent was obtained, using sterile technique the knee was prepped with alcohol.  The joint was entered and Kenalog 20 mg and 0.5 ml plain Lidocaine was then injected and the needle withdrawn.  The procedure was well tolerated.   ?The patient is asked to continue to rest the joint for a few more days before resuming regular activities.  It may be more painful for the first 1-2 days.  Watch for fever, or increased swelling or persistent pain in the joint. Call or return to clinic prn if such symptoms occur or there is failure to improve as anticipated. ?  ?  ? ?Meds ordered this encounter  ?Medications  ? triamcinolone acetonide (KENALOG-40) injection 20 mg  ? ?  ? ?Follow-up: Return if symptoms worsen or fail to improve. ? ?An After Visit Summary was printed and given to the patient. ? ?Rochel Brome, MD ?[place] ?[phone] ?

## 2022-02-12 ENCOUNTER — Ambulatory Visit
Admission: RE | Admit: 2022-02-12 | Discharge: 2022-02-12 | Disposition: A | Discharge status: Home / Self Care | Payer: No Typology Code available for payment source | Source: Ambulatory Visit | Attending: Hematology | Admitting: Hematology

## 2022-02-12 DIAGNOSIS — E2839 Other primary ovarian failure: Secondary | ICD-10-CM

## 2022-02-13 ENCOUNTER — Encounter: Payer: Self-pay | Admitting: Hematology

## 2022-02-13 ENCOUNTER — Other Ambulatory Visit: Payer: Self-pay | Admitting: Nurse Practitioner

## 2022-02-13 DIAGNOSIS — F418 Other specified anxiety disorders: Secondary | ICD-10-CM

## 2022-02-14 MED ORDER — ESCITALOPRAM OXALATE 10 MG PO TABS
10.0000 mg | ORAL_TABLET | Freq: Every day | ORAL | 0 refills | Status: DC
  Filled 2022-02-14: qty 90, 90d supply, fill #0

## 2022-02-15 ENCOUNTER — Other Ambulatory Visit (HOSPITAL_COMMUNITY): Payer: Self-pay

## 2022-02-19 ENCOUNTER — Encounter: Payer: Self-pay | Admitting: Nurse Practitioner

## 2022-02-19 ENCOUNTER — Ambulatory Visit (INDEPENDENT_AMBULATORY_CARE_PROVIDER_SITE_OTHER): Payer: No Typology Code available for payment source | Admitting: Nurse Practitioner

## 2022-02-19 VITALS — BP 136/84 | HR 83 | Temp 97.3°F | Ht 64.0 in | Wt 158.0 lb

## 2022-02-19 DIAGNOSIS — E782 Mixed hyperlipidemia: Secondary | ICD-10-CM | POA: Diagnosis not present

## 2022-02-19 DIAGNOSIS — F418 Other specified anxiety disorders: Secondary | ICD-10-CM

## 2022-02-19 DIAGNOSIS — F988 Other specified behavioral and emotional disorders with onset usually occurring in childhood and adolescence: Secondary | ICD-10-CM

## 2022-02-19 DIAGNOSIS — F5101 Primary insomnia: Secondary | ICD-10-CM

## 2022-02-19 NOTE — Progress Notes (Signed)
? ?Subjective:  ?Patient ID: Sheri Moore, female    DOB: 1981/03/14  Age: 41 y.o. MRN: [id number] ? ?Chief Complaint  ?Patient presents with  ? Anxiety  ? Depression  ? ? ?HPI ? Sheri Moore is a 41 year old Caucasian female that presents for follow-up of hyperlipidemia, depression, and anxiety. She has chronic insomnia. Recently completed radiation treatment for breast cancer.  ?Lipid/Cholesterol, Follow-up ? ?Last lipid panel Other pertinent labs  ?Lab Results  ?Component Value Date  ? CHOL 199 04/16/2021  ? HDL 49 04/16/2021  ? LDLCALC 128 (H) 04/16/2021  ? TRIG 125 04/16/2021  ? CHOLHDL 4.1 04/16/2021  ? Lab Results  ?Component Value Date  ? ALT 27 12/11/2021  ? AST 22 12/11/2021  ? PLT 270 12/11/2021  ? TSH 1.030 12/23/2021  ?  ? ?She was last seen for this 3 months ago.  ?Management includes heart healthy diet. ? ?She reports excellent compliance with treatment. ?She is not having side effects.  ? ?Current diet: in general, a "healthy" diet   ?Current exercise: walking ? ?Depression, Follow-up ? ?She  was last seen for this 3 months ago. ?Current treatment includes Lexapro 10 mg daily. ?  ?She reports excellent compliance with treatment. ?She is not having side effects.  ? ?She reports excellent tolerance of treatment. ?Current symptoms include: fatigue and insomnia ?She feels she is Unchanged since last visit. ? ? ?  02/19/2022  ? 11:37 AM 11/16/2021  ?  1:59 PM 04/20/2021  ?  1:24 PM  ?Depression screen PHQ 2/9  ?Decreased Interest 0 3 0  ?Down, Depressed, Hopeless 0 3 0  ?PHQ - 2 Score 0 6 0  ?Altered sleeping '3 2 1  '$ ?Tired, decreased energy '3 2 3  '$ ?Change in appetite 0 1 0  ?Feeling bad or failure about yourself  0 2 0  ?Trouble concentrating '3 2 2  '$ ?Moving slowly or fidgety/restless 0 1 0  ?Suicidal thoughts 0 0 0  ?PHQ-9 Score '9 16 6  '$ ?Difficult doing work/chores Very difficult Somewhat difficult Somewhat difficult  ?  ? ?Anxiety, Follow-up ? ?She was last seen for anxiety 3 months ago. ?    Current treatment  includes Ativan 0.5 mg PRN. ?  ?She reports excellent compliance with treatment. ?She reports good tolerance of treatment. ?She is not having side effects.  ? ?She feels her anxiety is mild and Unchanged since last visit. ? ? ?GAD-7 Results ? ?  11/16/2021  ?  1:59 PM 04/20/2021  ?  1:25 PM  ?GAD-7 Generalized Anxiety Disorder Screening Tool  ?1. Feeling Nervous, Anxious, or on Edge 0 0  ?2. Not Being Able to Stop or Control Worrying 0 0  ?3. Worrying Too Much About Different Things 0 0  ?4. Trouble Relaxing 0 0  ?5. Being So Restless it's Hard To Sit Still 0 0  ?6. Becoming Easily Annoyed or Irritable 3 2  ?7. Feeling Afraid As If Something Awful Might Happen 0 0  ?Total GAD-7 Score 3 2  ?Difficulty At Work, Home, or Getting  Along With Others? Somewhat difficult Not difficult at all  ? ? ? ? ?Insomnia: ?Currently on ambien, patient states she does not get a good nights sleep, wakes up fatigued. ?PHQ-9 Scores ? ?  02/19/2022  ? 11:37 AM 11/16/2021  ?  1:59 PM 04/20/2021  ?  1:24 PM  ?PHQ9 SCORE ONLY  ?PHQ-9 Total Score '9 16 6  '$ ? ? ? ? ?Current Outpatient Medications on File Prior to  Visit  ?Medication Sig Dispense Refill  ? amphetamine-dextroamphetamine (ADDERALL XR) 25 MG 24 hr capsule TAKE 1 CAPSULE BY MOUTH EVERY MORNING. 30 capsule 0  ? escitalopram (LEXAPRO) 10 MG tablet Take 1 tablet (10 mg total) by mouth daily. 90 tablet 0  ? fluticasone (FLONASE) 50 MCG/ACT nasal spray Place 2 sprays into both nostrils daily.    ? letrozole (FEMARA) 2.5 MG tablet Take 1 tablet (2.5 mg total) by mouth daily. 30 tablet 3  ? loratadine (CLARITIN) 10 MG tablet Take 10 mg by mouth daily.    ? LORazepam (ATIVAN) 0.5 MG tablet Take 1 tablet (0.5 mg total) by mouth every 8 (eight) hours. (Patient taking differently: Take 0.5 mg by mouth every 8 (eight) hours as needed for anxiety.) 30 tablet 1  ? propranolol (INDERAL) 20 MG tablet Take 1 tablet (20 mg total) by mouth 3 (three) times daily. 90 tablet 1  ? zolpidem (AMBIEN) 10 MG tablet  TAKE 1 TABLET BY MOUTH ONCE DAILY AT BEDTIME AS NEEDED FOR SLEEP 90 tablet 1  ? ?No current facility-administered medications on file prior to visit.  ? ?Past Medical History:  ?Diagnosis Date  ? Anxiety   ? Breast cancer ([place]) 07/06/21  ? Right Breast, first seen on mammogram done 07/06/21  ? Depression   ? Family history of breast cancer   ? Family history of melanoma   ? Family history of uterine cancer   ? Fibroid   ? Gastroschisis   ? ?Past Surgical History:  ?Procedure Laterality Date  ? ABDOMINAL HYSTERECTOMY    ? supracervical hysterectomy  ? ABDOMINAL SURGERY    ? as a newborn, gastrogheschesis  ? BREAST LUMPECTOMY WITH RADIOACTIVE SEED AND SENTINEL LYMPH NODE BIOPSY Right 09/10/2021  ? Procedure: RIGHT BREAST LUMPECTOMY WITH RADIOACTIVE SEED AND SENTINEL LYMPH NODE BIOPSY;  Surgeon: Stark Klein, MD;  Location: [facility];  Service: General;  Laterality: Right;  ? OOPHORECTOMY    ?  ?Family History  ?Problem Relation Age of Onset  ? Uterine cancer Mother 63  ? Diabetes Mother   ? Hypertension Mother   ? Thyroid disease Mother   ? Obesity Mother   ? Subarachnoid hemorrhage Father   ? Melanoma Father 8  ? Diabetes Father   ? Diabetes Sister   ? Obesity Sister   ? Diabetes Maternal Grandmother   ? Hypertension Maternal Grandmother   ? Melanoma Paternal Grandfather 60  ? Diabetes Paternal Grandfather   ? Breast cancer Other 19  ?     MGMs sister  ? Breast cancer Other 40  ?     MGF's sister  ? Breast cancer Other   ?     dx < 40; PGMs sister  ? ?Social History  ? ?Socioeconomic History  ? Marital status: Married  ?  Spouse name: Not on file  ? Number of children: 1  ? Years of education: College  ? Highest education level: Not on file  ?Occupational History  ? Occupation: [employer]  ?Tobacco Use  ? Smoking status: Former  ?  Packs/day: 0.25  ?  Years: 3.00  ?  Pack years: 0.75  ?  Types: Cigarettes  ?  Quit date: 10/11/2001  ?  Years since quitting: 20.3  ? Smokeless tobacco: Former  ?Vaping Use  ? Vaping  Use: Never used  ?Substance and Sexual Activity  ? Alcohol use: Not Currently  ?  Comment: occ  ? Drug use: No  ? Sexual activity: Yes  ?  Partners: Female  ?  Birth control/protection: Surgical  ?  Comment: supracervical hysterectomy  ?Other Topics Concern  ? Not on file  ?Social History Narrative  ? Lives with husband and son  ? Caffeine use: rarely  ? Right handed   ? ?Social Determinants of Health  ? ?Financial Resource Strain: Not on file  ?Food Insecurity: Not on file  ?Transportation Needs: Not on file  ?Physical Activity: Not on file  ?Stress: Not on file  ?Social Connections: Not on file  ? ? ?Review of Systems  ?Constitutional:  Positive for fatigue.  ?Psychiatric/Behavioral:  Positive for decreased concentration and sleep disturbance (insomnia).   ? ? ?Objective:  ?BP 136/84   Pulse 83   Temp (!) 97.3 ?F (36.3 ?C)   Ht '5\' 4"'$  (1.626 m)   Wt 158 lb (71.7 kg)   SpO2 100%   BMI 27.12 kg/m?  ? ? ?  02/19/2022  ? 11:33 AM 02/08/2022  ?  1:21 PM 12/23/2021  ?  1:04 PM  ?BP/Weight  ?Systolic BP 973 532 992  ?Diastolic BP 84 78 84  ?Wt. (Lbs) 158 158 157.6  ?BMI 27.12 kg/m2 26.29 kg/m2 26.23 kg/m2  ? ? ?Physical Exam ?Vitals reviewed.  ?Cardiovascular:  ?   Rate and Rhythm: Normal rate and regular rhythm.  ?   Pulses: Normal pulses.  ?   Heart sounds: Normal heart sounds.  ?Pulmonary:  ?   Effort: Pulmonary effort is normal.  ?   Breath sounds: Normal breath sounds.  ?Skin: ?   General: Skin is warm and dry.  ?   Capillary Refill: Capillary refill takes less than 2 seconds.  ?Neurological:  ?   General: No focal deficit present.  ?   Mental Status: She is alert and oriented to person, place, and time.  ?Psychiatric:     ?   Mood and Affect: Mood normal.     ?   Behavior: Behavior normal.  ? ?Lab Results  ?Component Value Date  ? WBC 6.2 12/11/2021  ? HGB 13.5 12/11/2021  ? HCT 39.1 12/11/2021  ? PLT 270 12/11/2021  ? GLUCOSE 113 (H) 12/11/2021  ? CHOL 199 04/16/2021  ? TRIG 125 04/16/2021  ? HDL 49 04/16/2021  ?  LDLCALC 128 (H) 04/16/2021  ? ALT 27 12/11/2021  ? AST 22 12/11/2021  ? NA 142 12/11/2021  ? K 3.3 (L) 12/11/2021  ? CL 105 12/11/2021  ? CREATININE 0.72 12/11/2021  ? BUN 14 12/11/2021  ? CO2 29 12/11/2021  ? TS

## 2022-02-19 NOTE — Patient Instructions (Addendum)
Discontinue Ambien ?Begin Belsomra 10 mg for insomnia ?Follow-up in 4-weeks for insomnia ? ?Insomnia ?Insomnia is a sleep disorder that makes it difficult to fall asleep or stay asleep. Insomnia can cause fatigue, low energy, difficulty concentrating, mood swings, and poor performance at work or school. ?There are three different ways to classify insomnia: ?Difficulty falling asleep. ?Difficulty staying asleep. ?Waking up too early in the morning. ?Any type of insomnia can be long-term (chronic) or short-term (acute). Both are common. Short-term insomnia usually lasts for 3 months or less. Chronic insomnia occurs at least three times a week for longer than 3 months. ?What are the causes? ?Insomnia may be caused by another condition, situation, or substance, such as: ?Having certain mental health conditions, such as anxiety and depression. ?Using caffeine, alcohol, tobacco, or drugs. ?Having gastrointestinal conditions, such as gastroesophageal reflux disease (GERD). ?Having certain medical conditions. These include: ?Asthma. ?Alzheimer's disease. ?Stroke. ?Chronic pain. ?An overactive thyroid gland (hyperthyroidism). ?Other sleep disorders, such as restless legs syndrome and sleep apnea. ?Menopause. ?Sometimes, the cause of insomnia may not be known. ?What increases the risk? ?Risk factors for insomnia include: ?Gender. Females are affected more often than males. ?Age. Insomnia is more common as people get older. ?Stress and certain medical and mental health conditions. ?Lack of exercise. ?Having an irregular work schedule. This may include working night shifts and traveling between different time zones. ?What are the signs or symptoms? ?If you have insomnia, the main symptom is having trouble falling asleep or having trouble staying asleep. This may lead to other symptoms, such as: ?Feeling tired or having low energy. ?Feeling nervous about going to sleep. ?Not feeling rested in the morning. ?Having trouble  concentrating. ?Feeling irritable, anxious, or depressed. ?How is this diagnosed? ?This condition may be diagnosed based on: ?Your symptoms and medical history. Your health care provider may ask about: ?Your sleep habits. ?Any medical conditions you have. ?Your mental health. ?A physical exam. ?How is this treated? ?Treatment for insomnia depends on the cause. Treatment may focus on treating an underlying condition that is causing the insomnia. Treatment may also include: ?Medicines to help you sleep. ?Counseling or therapy. ?Lifestyle adjustments to help you sleep better. ?Follow these instructions at home: ?Eating and drinking ? ?Limit or avoid alcohol, caffeinated beverages, and products that contain nicotine and tobacco, especially close to bedtime. These can disrupt your sleep. ?Do not eat a large meal or eat spicy foods right before bedtime. This can lead to digestive discomfort that can make it hard for you to sleep. ?Sleep habits ? ?Keep a sleep diary to help you and your health care provider figure out what could be causing your insomnia. Write down: ?When you sleep. ?When you wake up during the night. ?How well you sleep and how rested you feel the next day. ?Any side effects of medicines you are taking. ?What you eat and drink. ?Make your bedroom a dark, comfortable place where it is easy to fall asleep. ?Put up shades or blackout curtains to block light from outside. ?Use a white noise machine to block noise. ?Keep the temperature cool. ?Limit screen use before bedtime. This includes: ?Not watching TV. ?Not using your smartphone, tablet, or computer. ?Stick to a routine that includes going to bed and waking up at the same times every day and night. This can help you fall asleep faster. Consider making a quiet activity, such as reading, part of your nighttime routine. ?Try to avoid taking naps during the day so that you  sleep better at night. ?Get out of bed if you are still awake after 15 minutes of  trying to sleep. Keep the lights down, but try reading or doing a quiet activity. When you feel sleepy, go back to bed. ?General instructions ?Take over-the-counter and prescription medicines only as told by your health care provider. ?Exercise regularly as told by your health care provider. However, avoid exercising in the hours right before bedtime. ?Use relaxation techniques to manage stress. Ask your health care provider to suggest some techniques that may work well for you. These may include: ?Breathing exercises. ?Routines to release muscle tension. ?Visualizing peaceful scenes. ?Make sure that you drive carefully. Do not drive if you feel very sleepy. ?Keep all follow-up visits. This is important. ?Contact a health care provider if: ?You are tired throughout the day. ?You have trouble in your daily routine due to sleepiness. ?You continue to have sleep problems, or your sleep problems get worse. ?Get help right away if: ?You have thoughts about hurting yourself or someone else. ?Get help right away if you feel like you may hurt yourself or others, or have thoughts about taking your own life. Go to your nearest emergency room or: ?Call 911. ?Call the [facility] at [phone] or [phone]. This is open 24 hours a day. ?Text the Crisis Text Line at (404)384-3567. ?Summary ?Insomnia is a sleep disorder that makes it difficult to fall asleep or stay asleep. ?Insomnia can be long-term (chronic) or short-term (acute). ?Treatment for insomnia depends on the cause. Treatment may focus on treating an underlying condition that is causing the insomnia. ?Keep a sleep diary to help you and your health care provider figure out what could be causing your insomnia. ?This information is not intended to replace advice given to you by your health care provider. Make sure you discuss any questions you have with your health care provider. ?Document Revised: 09/07/2021 Document Reviewed: 09/07/2021 ?Elsevier  Patient Education ? [employer]. ?Quality Sleep Information, Adult ?Quality sleep is important for your mental and physical health. It also improves your quality of life. Quality sleep means you: ?Are asleep for most of the time you are in bed. ?Fall asleep within 30 minutes. ?Wake up no more than once a night.  ?Are awake for no longer than 20 minutes if you do wake up during the night. ?Most adults need 7-8 hours of quality sleep each night. ?How can poor sleep affect me? ?If you do not get enough quality sleep, you may have: ?Mood swings. ?Daytime sleepiness. ?Confusion. ?Decreased reaction time. ?Sleep disorders, such as insomnia and sleep apnea. ?Difficulty with: ?Solving problems. ?Coping with stress. ?Paying attention. ?These issues may affect your performance and productivity at work, school, and at home. Lack of sleep may also put you at higher risk for accidents, suicide, and risky behaviors. ?If you do not get quality sleep you may also be at higher risk for several health problems, including: ?Infections. ?Type 2 diabetes. ?Heart disease. ?High blood pressure. ?Obesity. ?Worsening of long-term conditions, like arthritis, kidney disease, depression, Parkinson's disease, and epilepsy. ?What actions can I take to get more quality sleep? ? ?  ? ?Stick to a sleep schedule. Go to sleep and wake up at about the same time each day. Do not try to sleep less on weekdays and make up for lost sleep on weekends. This does not work. ?Try to get about 30 minutes of exercise on most days. Do not exercise 2-3 hours before going to  bed. ?Limit naps during the day to 30 minutes or less. ?Do not use any products that contain nicotine or tobacco, such as cigarettes or e-cigarettes. If you need help quitting, ask your health care provider. ?Do not drink caffeinated beverages for at least 8 hours before going to bed. Coffee, tea, and some sodas contain caffeine. ?Do not drink alcohol close to bedtime. ?Do not eat large  meals close to bedtime. ?Do not take naps in the late afternoon. ?Try to get at least 30 minutes of sunlight every day. Morning sunlight is best. ?Make time to relax before bed. Reading, listening to music, or taking a

## 2022-02-21 MED ORDER — BELSOMRA 10 MG PO TABS: 10.0000 mg | ORAL_TABLET | Freq: Every evening | ORAL | 0 refills | Status: DC | PRN

## 2022-02-22 ENCOUNTER — Other Ambulatory Visit: Payer: No Typology Code available for payment source

## 2022-02-22 ENCOUNTER — Ambulatory Visit: Payer: No Typology Code available for payment source | Admitting: Nurse Practitioner

## 2022-02-22 DIAGNOSIS — E782 Mixed hyperlipidemia: Secondary | ICD-10-CM

## 2022-02-22 LAB — CBC WITH DIFFERENTIAL/PLATELET
Basophils Absolute: 0.1 10*3/uL (ref 0.0–0.2)
Basos: 1 %
EOS (ABSOLUTE): 0.1 10*3/uL (ref 0.0–0.4)
Eos: 2 %
Hematocrit: 44 % (ref 34.0–46.6)
Hemoglobin: 14.7 g/dL (ref 11.1–15.9)
Immature Grans (Abs): 0 10*3/uL (ref 0.0–0.1)
Immature Granulocytes: 0 %
Lymphocytes Absolute: 1.3 10*3/uL (ref 0.7–3.1)
Lymphs: 21 %
MCH: 28.8 pg (ref 26.6–33.0)
MCHC: 33.4 g/dL (ref 31.5–35.7)
MCV: 86 fL (ref 79–97)
Monocytes Absolute: 0.4 10*3/uL (ref 0.1–0.9)
Monocytes: 6 %
Neutrophils Absolute: 4.3 10*3/uL (ref 1.4–7.0)
Neutrophils: 70 %
Platelets: 301 10*3/uL (ref 150–450)
RBC: 5.11 x10E6/uL (ref 3.77–5.28)
RDW: 12.8 % (ref 11.7–15.4)
WBC: 6.2 10*3/uL (ref 3.4–10.8)

## 2022-02-23 LAB — LIPID PANEL
Chol/HDL Ratio: 3.7 ratio (ref 0.0–4.4)
Cholesterol, Total: 191 mg/dL (ref 100–199)
HDL: 52 mg/dL (ref >39)
LDL Chol Calc (NIH): 109 mg/dL — ABNORMAL HIGH (ref 0–99)
Triglycerides: 175 mg/dL — ABNORMAL HIGH (ref 0–149)
VLDL Cholesterol Cal: 30 mg/dL (ref 5–40)

## 2022-02-23 LAB — COMPREHENSIVE METABOLIC PANEL
ALT: 28 IU/L (ref 0–32)
AST: 25 IU/L (ref 0–40)
Albumin/Globulin Ratio: 2 (ref 1.2–2.2)
Albumin: 4.5 g/dL (ref 3.8–4.8)
Alkaline Phosphatase: 124 IU/L — ABNORMAL HIGH (ref 44–121)
BUN/Creatinine Ratio: 20 (ref 9–23)
BUN: 13 mg/dL (ref 6–24)
Bilirubin Total: 0.3 mg/dL (ref 0.0–1.2)
CO2: 24 mmol/L (ref 20–29)
Calcium: 9.6 mg/dL (ref 8.7–10.2)
Chloride: 103 mmol/L (ref 96–106)
Creatinine, Ser: 0.64 mg/dL (ref 0.57–1.00)
Globulin, Total: 2.3 g/dL (ref 1.5–4.5)
Glucose: 103 mg/dL — ABNORMAL HIGH (ref 70–99)
Potassium: 4.8 mmol/L (ref 3.5–5.2)
Sodium: 144 mmol/L (ref 134–144)
Total Protein: 6.8 g/dL (ref 6.0–8.5)
eGFR: 113 mL/min/{1.73_m2} (ref >59)

## 2022-02-23 LAB — CARDIOVASCULAR RISK ASSESSMENT

## 2022-02-24 ENCOUNTER — Encounter: Payer: No Typology Code available for payment source | Admitting: Psychology

## 2022-02-26 ENCOUNTER — Other Ambulatory Visit: Payer: Self-pay | Admitting: Nurse Practitioner

## 2022-02-26 ENCOUNTER — Other Ambulatory Visit (HOSPITAL_COMMUNITY): Payer: Self-pay

## 2022-02-26 DIAGNOSIS — E782 Mixed hyperlipidemia: Secondary | ICD-10-CM

## 2022-02-26 DIAGNOSIS — F418 Other specified anxiety disorders: Secondary | ICD-10-CM

## 2022-02-26 DIAGNOSIS — E663 Overweight: Secondary | ICD-10-CM

## 2022-02-26 MED ORDER — QSYMIA 3.75-23 MG PO CP24
1.0000 | ORAL_CAPSULE | Freq: Every day | ORAL | 0 refills | Status: DC
  Filled 2022-02-26: qty 30, 30d supply, fill #0

## 2022-03-01 ENCOUNTER — Other Ambulatory Visit (HOSPITAL_COMMUNITY): Payer: Self-pay

## 2022-03-04 ENCOUNTER — Other Ambulatory Visit: Payer: Self-pay

## 2022-03-04 ENCOUNTER — Other Ambulatory Visit (HOSPITAL_COMMUNITY): Payer: Self-pay

## 2022-03-04 MED ORDER — WEGOVY 0.25 MG/0.5ML ~~LOC~~ SOAJ
0.2500 mg | SUBCUTANEOUS | 0 refills | Status: DC
  Filled 2022-03-04 – 2022-03-09 (×2): qty 2, 28d supply, fill #0

## 2022-03-09 ENCOUNTER — Other Ambulatory Visit (HOSPITAL_COMMUNITY): Payer: Self-pay

## 2022-03-10 ENCOUNTER — Telehealth: Payer: Self-pay

## 2022-03-10 NOTE — Telephone Encounter (Signed)
PA submitted and approved covermymeds for [facility]. "The request has been approved. The authorization is effective for a maximum of 7 fills from 03/05/2022 to 09/24/2022, as long as the member is enrolled in their current health plan. The request was approved as submitted. The request was approved for 51m per 28 days.Additional prior authorizations ([place]) have been entered [id number].'5mg'$ /0.540mallowing a maximum of 7 fills with a quantity limit of 49m80mer 28 days ([place] 7752)Wegovy '1mg'$ /0.5mL17mlowing a maximum of 7 fills with a quantity limit of 49mL 69m 28 days ([place] 7753)Wegovy 1.'7mg'$ /0.75mL 21mwing a maximum of 7 fills with a quantity limit of 3mL pe79m8 days ([place] 7754)Wegovy 2.'4mg'$ /0.75mL al149mng a maximum of 7 fills with a quantity limit of 3mL per 71mdays ([place] [id number])These authorizations are effective 03/05/2022 through 09/24/2022. A written notification letter will follow with additional details."

## 2022-03-11 ENCOUNTER — Telehealth: Payer: Self-pay | Admitting: Nurse Practitioner

## 2022-03-11 DIAGNOSIS — G43709 Chronic migraine without aura, not intractable, without status migrainosus: Secondary | ICD-10-CM

## 2022-03-11 DIAGNOSIS — Z6827 Body mass index (BMI) 27.0-27.9, adult: Secondary | ICD-10-CM

## 2022-03-11 MED ORDER — TOPIRAMATE 25 MG PO TABS: 25.0000 mg | ORAL_TABLET | Freq: Every day | ORAL | 0 refills | Status: DC

## 2022-03-11 MED ORDER — PHENTERMINE HCL 15 MG PO CAPS
15.0000 mg | ORAL_CAPSULE | ORAL | 0 refills | Status: DC
Start: 2022-03-11 — End: 2022-03-11
  Filled 2022-03-11: qty 30, 30d supply, fill #0

## 2022-03-11 MED ORDER — PHENTERMINE HCL 15 MG PO CAPS: 15.0000 mg | ORAL_CAPSULE | ORAL | 0 refills | Status: DC

## 2022-03-11 MED ORDER — TOPIRAMATE 25 MG PO TABS
25.0000 mg | ORAL_TABLET | Freq: Every day | ORAL | 0 refills | Status: DC
  Filled 2022-03-11: qty 30, 30d supply, fill #0

## 2022-03-11 NOTE — Telephone Encounter (Signed)
Pt prescribed Wegovy for weight loss. Medication unavailable currently due to supply chain issues. Pt requested Phentermine as an alternative. Pt states she has experienced increased migraine headaches, affecting left eye. Requested Topamax prescription.

## 2022-03-12 ENCOUNTER — Other Ambulatory Visit (HOSPITAL_COMMUNITY): Payer: Self-pay

## 2022-03-15 ENCOUNTER — Telehealth: Payer: Self-pay | Admitting: *Deleted

## 2022-03-16 ENCOUNTER — Other Ambulatory Visit (HOSPITAL_COMMUNITY): Payer: Self-pay

## 2022-03-18 ENCOUNTER — Inpatient Hospital Stay: Payer: No Typology Code available for payment source | Attending: Genetic Counselor | Admitting: Adult Health

## 2022-03-18 DIAGNOSIS — C50111 Malignant neoplasm of central portion of right female breast: Secondary | ICD-10-CM | POA: Diagnosis not present

## 2022-03-18 NOTE — Progress Notes (Signed)
SURVIVORSHIP VIRTUAL VISIT:  I connected with Sheri Moore on 03/18/22 at  1:45 PM EDT by my chart video and verified that I am speaking with the correct person using two identifiers.  I discussed the limitations, risks, security and privacy concerns of performing an evaluation and management service by video and the availability of in person appointments. I also discussed with the patient that there may be a patient responsible charge related to this service. The patient expressed understanding and agreed to proceed.   Patient location: At office, [facility] in [place], [place] Provider location: [facility] private office  BRIEF ONCOLOGIC HISTORY:  Oncology History Overview Note   Cancer Staging  Cancer of central portion of right breast [facility]) Staging form: Breast, AJCC 8th Edition - Clinical stage from 08/04/2021: Stage IA (cT1b, cN0, cM0, G2, ER+, PR+, HER2-) - Unsigned - Pathologic stage from 09/10/2021: Stage IA (pT1c, pN0, cM0, G2, ER+, PR+, HER2-, Oncotype DX score: 22) - Signed by Truitt Merle, MD on 10/01/2021     Cancer of central portion of right breast ([place])  07/29/2021 Mammogram   EXAM: DIGITAL DIAGNOSTIC UNILATERAL RIGHT MAMMOGRAM WITH TOMOSYNTHESIS AND CAD; ULTRASOUND RIGHT BREAST LIMITED  IMPRESSION: Suspicious mass in the right breast at 9 o'clock measuring 1.0 cm. Targeted ultrasound performed in the right axilla demonstrating normal lymph nodes.   08/04/2021 Initial Biopsy   Diagnosis Breast, right, needle core biopsy, 9 o'clock, ribbon shaped clip - INVASIVE DUCTAL CARCINOMA - DUCTAL CARCINOMA IN SITU - SEE COMMENT Microscopic Comment Based on the biopsy, the carcinoma appears Nottingham grade 2 of 3 and measures 0.9 cm in greatest linear extent.  PROGNOSTIC INDICATORS Results: The tumor cells are EQUIVOCAL for Her2 (2+). Her2 by FISH will be performed and results reported separately. Estrogen Receptor: 100%, POSITIVE, STRONG STAINING INTENSITY Progesterone  Receptor: 95%, POSITIVE, STRONG STAINING INTENSITY Proliferation Marker Ki67: 10%  FLUORESCENCE IN-SITU HYBRIDIZATION Results: GROUP 5: HER2 **NEGATIVE**   09/01/2021 Initial Diagnosis   Cancer of central portion of right breast ([place])   09/08/2021 Genetic Testing   Negative genetic testing on the BRCAPlus panel.  The report date is 08/29/2021.  Negative genetic testing on the CancerNext-Expanded+RNAinsight.  DICER1 p.T43M VUS was identified. The report date is 09/08/2021.  The BRCAPlus gene panel offered by [facility] and includes sequencing and rearrangement analysis for the following 6 genes: BRCA1, BRCA2, CDH1, PALB2, PTEN, and TP53.  The CancerNext-Expanded gene panel offered by [facility] and includes sequencing and rearrangement analysis for the following 77 genes: AIP, ALK, APC*, ATM*, AXIN2, BAP1, BARD1, BLM, BMPR1A, BRCA1*, BRCA2*, BRIP1*, CDC73, CDH1*, CDK4, CDKN1B, CDKN2A, CHEK2*, CTNNA1, DICER1, FANCC, FH, FLCN, GALNT12, KIF1B, LZTR1, MAX, MEN1, MET, MLH1*, MSH2*, MSH3, MSH6*, MUTYH*, NBN, NF1*, NF2, NTHL1, PALB2*, PHOX2B, PMS2*, POT1, PRKAR1A, PTCH1, PTEN*, RAD51C*, RAD51D*, RB1, RECQL, RET, SDHA, SDHAF2, SDHB, SDHC, SDHD, SMAD4, SMARCA4, SMARCB1, SMARCE1, STK11, SUFU, TMEM127, TP53*, TSC1, TSC2, VHL and XRCC2 (sequencing and deletion/duplication); EGFR, EGLN1, HOXB13, KIT, MITF, PDGFRA, POLD1, and POLE (sequencing only); EPCAM and GREM1 (deletion/duplication only). DNA and RNA analyses performed for * genes.    09/10/2021 Cancer Staging   Staging form: Breast, AJCC 8th Edition - Pathologic stage from 09/10/2021: Stage IA (pT1c, pN0, cM0, G2, ER+, PR+, HER2-, Oncotype DX score: 22) - Signed by Truitt Merle, MD on 10/01/2021 Stage prefix: Initial diagnosis Multigene prognostic tests performed: Oncotype DX Recurrence score range: Greater than or equal to 11 Histologic grading system: 3 grade system Residual tumor (R): R0 - None   09/10/2021 Definitive  Surgery   FINAL MICROSCOPIC  DIAGNOSIS:  A. BREAST, RIGHT, LUMPECTOMY:  Invasive ductal carcinoma with clear margins of resection.  INVASIVE CARCINOMA OF THE BREAST:  Resection  Procedure: Lumpectomy.  Specimen Laterality: Right.  Histologic Type: Invasive ductal carcinoma.  Histologic Grade:       Glandular (Acinar)/Tubular Differentiation: 3 out of possible 3.       Nuclear Pleomorphism: 2 out of possible 3.       Mitotic Rate: 1 out of possible 3.       Overall Grade: 2 out of possible 3 (from a score of 6 out of  possible 9)  Tumor Size: 1.2 cm. x 1.0 cm. x 0.8 cm.  Ductal Carcinoma In Situ: Present. Small foci without comedo necrosis.  Tumor Extent: Limited to breast parenchyma.  Treatment Effect in the Breast: No known presurgical therapy  Margins: All margins are negative for invasive carcinoma.       Distance from Closest Margin (mm): 7 mm.       Specify Closest Margin (required only if <19m): Inferior.  DCIS Margins: Uninvolved by DCIS.       Distance from Closest Margin (mm): 6 mm.       Specify Closest Margin (required only if <142m: Posterior.  Regional Lymph Nodes:       Number of Lymph Nodes Examined: Five (5).       Number of Sentinel Nodes Examined: Five (5).       Number of Lymph Nodes with Macrometastases (>2 mm): None (0).       Number of Lymph Nodes with Micrometastases: None (0).       Number of Lymph Nodes with Isolated Tumor Cells (=0.2 mm or =200 cells): None (0).       Size of Largest Metastatic Deposit (mm): N.A.       Extranodal Extension: N.A.  Distant Metastasis:       Distant Site(s) Involved: None known.  Breast Biomarker Testing Performed on Previous Biopsy:       Testing Performed on Case Number: [id number] from 08/04/2021.             Estrogen Receptor: 100% (Strong intensity).             Progesterone Receptor: 95% (Strong intensity).             HER2: 2+ by IHC / Negative by FISH.             Ki-67: 10%  Pathologic Stage Classification (pTNM, AJCC 8th Edition): pT1c,  pN0  Representative Tumor Block: A3 and A2.  Comment(s): None.   B. LYMPH NODE, RIGHT (5), SENTINEL, EXCISION:  5 lymph nodes with fatty infiltration which is negative for metastatic  Carcinoma. (0/5)    11/03/2021 - 12/17/2021 Radiation Therapy   Site Technique Total Dose (Gy) Dose per Fx (Gy) Completed Fx Beam Energies  Breast, Right: Breast_R 3D 50.4/50.4 1.8 28/28 6X  Breast, Right: Breast_R_Bst 3D 10/10 2 5/5 6X     12/2021 -  Anti-estrogen oral therapy   Letrozole x 7-10 years     INTERVAL HISTORY:  Ms. SmBarketto review her survivorship care plan detailing her treatment course for breast cancer, as well as monitoring long-term side effects of that treatment, education regarding health maintenance, screening, and overall wellness and health promotion.     Overall, Ms. SmNoltoneports feeling quite well. She is taking Letrozole daily and tolerates it well without any issues.    REVIEW OF SYSTEMS:  Review of  Systems  Constitutional:  Negative for appetite change, chills, fatigue, fever and unexpected weight change.  HENT:   Negative for hearing loss, lump/mass and trouble swallowing.   Eyes:  Negative for eye problems and icterus.  Respiratory:  Negative for chest tightness, cough and shortness of breath.   Cardiovascular:  Negative for chest pain, leg swelling and palpitations.  Gastrointestinal:  Negative for abdominal distention, abdominal pain, constipation, diarrhea, nausea and vomiting.  Endocrine: Negative for hot flashes.  Genitourinary:  Negative for difficulty urinating.   Musculoskeletal:  Negative for arthralgias.  Skin:  Negative for itching and rash.  Neurological:  Negative for dizziness, extremity weakness, headaches and numbness.  Hematological:  Negative for adenopathy. Does not bruise/bleed easily.  Psychiatric/Behavioral:  Negative for depression. The patient is not nervous/anxious.   Breast: Denies any new nodularity, masses, tenderness, nipple changes, or  nipple discharge.      ONCOLOGY TREATMENT TEAM:  1. Surgeon:  Dr. Barry Dienes at [facility] 2. Medical Oncologist: Dr. Burr Medico  3. Radiation Oncologist: Dr. Lisbeth Renshaw    PAST MEDICAL/SURGICAL HISTORY:  Past Medical History:  Diagnosis Date   Anxiety    Breast cancer ([place]) 07/06/21   Right Breast, first seen on mammogram done 07/06/21   Depression    Family history of breast cancer    Family history of melanoma    Family history of uterine cancer    Fibroid    Gastroschisis    Past Surgical History:  Procedure Laterality Date   ABDOMINAL HYSTERECTOMY     supracervical hysterectomy   ABDOMINAL SURGERY     as a newborn, gastrogheschesis   BREAST LUMPECTOMY WITH RADIOACTIVE SEED AND SENTINEL LYMPH NODE BIOPSY Right 09/10/2021   Procedure: RIGHT BREAST LUMPECTOMY WITH RADIOACTIVE SEED AND SENTINEL LYMPH NODE BIOPSY;  Surgeon: Stark Klein, MD;  Location: [place];  Service: General;  Laterality: Right;   OOPHORECTOMY       ALLERGIES:  No Known Allergies   CURRENT MEDICATIONS:  Outpatient Encounter Medications as of 03/18/2022  Medication Sig   amphetamine-dextroamphetamine (ADDERALL XR) 25 MG 24 hr capsule TAKE 1 CAPSULE BY MOUTH EVERY MORNING.   escitalopram (LEXAPRO) 10 MG tablet Take 1 tablet (10 mg total) by mouth daily.   fluticasone (FLONASE) 50 MCG/ACT nasal spray Place 2 sprays into both nostrils daily.   letrozole (FEMARA) 2.5 MG tablet Take 1 tablet (2.5 mg total) by mouth daily.   loratadine (CLARITIN) 10 MG tablet Take 10 mg by mouth daily.   LORazepam (ATIVAN) 0.5 MG tablet Take 1 tablet (0.5 mg total) by mouth every 8 (eight) hours. (Patient taking differently: Take 0.5 mg by mouth every 8 (eight) hours as needed for anxiety.)   phentermine 15 MG capsule Take 1 capsule (15 mg total) by mouth every morning.   propranolol (INDERAL) 20 MG tablet Take 1 tablet (20 mg total) by mouth 3 (three) times daily.   Semaglutide-Weight Management (WEGOVY) 0.25 MG/0.5ML SOAJ  Inject 0.25 mg into the skin once a week.   Suvorexant (BELSOMRA) 10 MG TABS Take 10 mg by mouth at bedtime as needed.   topiramate (TOPAMAX) 25 MG tablet Take 1 tablet (25 mg total) by mouth at bedtime.   No facility-administered encounter medications on file as of 03/18/2022.   ONCOLOGIC FAMILY HISTORY:  Family History  Problem Relation Age of Onset   Uterine cancer Mother 28   Diabetes Mother    Hypertension Mother    Thyroid disease Mother    Obesity Mother  Subarachnoid hemorrhage Father    Melanoma Father 64   Diabetes Father    Diabetes Sister    Obesity Sister    Diabetes Maternal Grandmother    Hypertension Maternal Grandmother    Melanoma Paternal Grandfather 47   Diabetes Paternal Grandfather    Breast cancer Other 37       MGMs sister   Breast cancer Other 58       MGF's sister   Breast cancer Other        dx < 31; PGMs sister     GENETIC COUNSELING/TESTING: See above, negative  SOCIAL HISTORY:  Social History   Socioeconomic History   Marital status: Married    Spouse name: Not on file   Number of children: 1   Years of education: College   Highest education level: Not on file  Occupational History   Occupation: [employer]  Tobacco Use   Smoking status: Former    Packs/day: 0.25    Years: 3.00    Total pack years: 0.75    Types: Cigarettes    Quit date: 10/11/2001    Years since quitting: 20.4   Smokeless tobacco: Former  Scientific laboratory technician Use: Never used  Substance and Sexual Activity   Alcohol use: Not Currently    Comment: occ   Drug use: No   Sexual activity: Yes    Partners: Female    Birth control/protection: Surgical    Comment: supracervical hysterectomy  Other Topics Concern   Not on file  Social History Narrative   Lives with husband and son   Caffeine use: rarely   Right handed    Social Determinants of Health   Financial Resource Strain: Not on file  Food Insecurity: Not on file  Transportation Needs: Not on  file  Physical Activity: Not on file  Stress: Not on file  Social Connections: Not on file  Intimate Partner Violence: Not At Risk (09/08/2021)   Humiliation, Afraid, Rape, and Kick questionnaire    Fear of Current or Ex-Partner: No    Emotionally Abused: No    Physically Abused: No    Sexually Abused: No     OBSERVATIONS/OBJECTIVE:  Patient appears well.  She is in no apparent distress.  Mood and behavior are normal.  Speech is normal.    LABORATORY DATA:  None for this visit.  DIAGNOSTIC IMAGING:  None for this visit.      ASSESSMENT AND PLAN:  Ms.. Sampath is a pleasant 41 y.o. female with Stage IA right breast invasive ductal carcinoma, ER+/PR+/HER2-, diagnosed in 07/2021, treated with lumpectomy, adjuvant radiation therapy, and anti-estrogen therapy with Letrozole beginning in 01/2022.  She presents to the Survivorship Clinic for our initial meeting and routine follow-up post-completion of treatment for breast cancer.    1. Stage IA right breast cancer:  Ms. Livas is continuing to recover from definitive treatment for breast cancer. She will follow-up with her medical oncologist, Dr. Lindi Adie with history and physical exam per surveillance protocol.  She will continue her anti-estrogen therapy with Letrozole. Thus far, she is tolerating the Letrozole well, with minimal side effects. She was instructed to make Dr. Lindi Adie or myself aware if she begins to experience any worsening side effects of the medication and I could see her back in clinic to help manage those side effects, as needed. Her mammogram is due 06/2022; orders placed today. Today, a comprehensive survivorship care plan and treatment summary was reviewed with the patient today detailing  her breast cancer diagnosis, treatment course, potential late/long-term effects of treatment, appropriate follow-up care with recommendations for the future, and patient education resources.  A copy of this summary, along with a letter will be  sent to the patient's primary care provider via mail/fax/In Basket message after today's visit.    2. Bone health:  Given Ms. Bertucci's age/history of breast cancer and her current treatment regimen including anti-estrogen therapy with Letrozole, she is at risk for bone demineralization.  Her last DEXA scan was 02/2022 and was normal.  She was given education on specific activities to promote bone health.  3. Cancer screening:  Due to Ms. Etheridge's history and her age, she should receive screening for skin cancers, colon cancer, and gynecologic cancers.  The information and recommendations are listed on the patient's comprehensive care plan/treatment summary and were reviewed in detail with the patient.    4. Health maintenance and wellness promotion: Ms. Blizard was encouraged to consume 5-7 servings of fruits and vegetables per day. We reviewed the "Nutrition Rainbow" handout.  She was also encouraged to engage in moderate to vigorous exercise for 30 minutes per day most days of the week. We discussed the [employer] fitness program, which is designed for cancer survivors to help them become more physically fit after cancer treatments.  She was instructed to limit her alcohol consumption and continue to abstain from tobacco use.     5. Support services/counseling: It is not uncommon for this period of the patient's cancer care trajectory to be one of many emotions and stressors.  She was given information regarding our available services and encouraged to contact me with any questions or for help enrolling in any of our support group/programs.    Follow up instructions:    -Return to cancer center in 06/2022 for f/u with Dr. Burr Medico  -Mammogram due in 06/2022 -Follow up with surgery in 04/2022 -She is welcome to return back to the Survivorship Clinic at any time; no additional follow-up needed at this time.  -Consider referral back to survivorship as a long-term survivor for continued surveillance  The  patient was provided an opportunity to ask questions and all were answered. The patient agreed with the plan and demonstrated an understanding of the instructions.   The patient was advised to call back or seek an in-person evaluation if the symptoms worsen or if the condition fails to improve as anticipated.   I provided 20 minutes of face-to-face video visit time during this encounter, and > 50% was spent counseling as documented under my assessment & plan.  Scot Dock, NP

## 2022-03-23 ENCOUNTER — Other Ambulatory Visit: Payer: Self-pay | Admitting: Nurse Practitioner

## 2022-03-23 ENCOUNTER — Other Ambulatory Visit (HOSPITAL_COMMUNITY): Payer: Self-pay

## 2022-03-23 DIAGNOSIS — F900 Attention-deficit hyperactivity disorder, predominantly inattentive type: Secondary | ICD-10-CM

## 2022-03-24 MED ORDER — AMPHETAMINE-DEXTROAMPHET ER 25 MG PO CP24
ORAL_CAPSULE | Freq: Every morning | ORAL | 0 refills | Status: DC
  Filled 2022-03-24: qty 30, 30d supply, fill #0

## 2022-03-25 ENCOUNTER — Other Ambulatory Visit (HOSPITAL_COMMUNITY): Payer: Self-pay

## 2022-04-05 ENCOUNTER — Other Ambulatory Visit (HOSPITAL_COMMUNITY): Payer: Self-pay

## 2022-04-07 ENCOUNTER — Other Ambulatory Visit (HOSPITAL_COMMUNITY): Payer: Self-pay

## 2022-04-07 ENCOUNTER — Other Ambulatory Visit: Payer: Self-pay | Admitting: Nurse Practitioner

## 2022-04-07 ENCOUNTER — Other Ambulatory Visit: Payer: Self-pay

## 2022-04-07 DIAGNOSIS — E663 Overweight: Secondary | ICD-10-CM

## 2022-04-07 DIAGNOSIS — F418 Other specified anxiety disorders: Secondary | ICD-10-CM

## 2022-04-07 MED ORDER — WEGOVY 0.5 MG/0.5ML ~~LOC~~ SOAJ
0.5000 mg | SUBCUTANEOUS | 0 refills | Status: DC
  Filled 2022-04-07: qty 2, 28d supply, fill #0

## 2022-04-07 MED ORDER — WEGOVY 0.25 MG/0.5ML ~~LOC~~ SOAJ
0.5000 mg | SUBCUTANEOUS | 0 refills | Status: DC
Start: 2022-04-07 — End: 2022-04-07
  Filled 2022-04-07: qty 4, fill #0

## 2022-04-12 ENCOUNTER — Other Ambulatory Visit (HOSPITAL_COMMUNITY): Payer: Self-pay

## 2022-04-14 ENCOUNTER — Other Ambulatory Visit (HOSPITAL_COMMUNITY): Payer: Self-pay

## 2022-04-20 ENCOUNTER — Encounter: Payer: Self-pay | Admitting: Neurology

## 2022-04-22 ENCOUNTER — Telehealth: Payer: Self-pay | Admitting: Hematology

## 2022-04-22 ENCOUNTER — Ambulatory Visit (INDEPENDENT_AMBULATORY_CARE_PROVIDER_SITE_OTHER): Payer: No Typology Code available for payment source | Admitting: Neurology

## 2022-04-22 ENCOUNTER — Other Ambulatory Visit (HOSPITAL_COMMUNITY): Payer: Self-pay

## 2022-04-22 ENCOUNTER — Encounter: Payer: Self-pay | Admitting: Neurology

## 2022-04-22 VITALS — BP 127/90 | HR 81 | Ht 65.0 in | Wt 158.0 lb

## 2022-04-22 DIAGNOSIS — H469 Unspecified optic neuritis: Secondary | ICD-10-CM

## 2022-04-22 DIAGNOSIS — Z170 Estrogen receptor positive status [ER+]: Secondary | ICD-10-CM

## 2022-04-22 DIAGNOSIS — R9082 White matter disease, unspecified: Secondary | ICD-10-CM

## 2022-04-22 DIAGNOSIS — C50411 Malignant neoplasm of upper-outer quadrant of right female breast: Secondary | ICD-10-CM | POA: Diagnosis not present

## 2022-04-22 MED ORDER — PREDNISONE 50 MG PO TABS
ORAL_TABLET | ORAL | 0 refills | Status: DC
  Filled 2022-04-22: qty 36, 3d supply, fill #0

## 2022-04-22 NOTE — Progress Notes (Signed)
[facility]  PATIENT: Sheri Moore DOB: 12-Aug-1981  REFERRING DOCTOR OR PCP: Rochel Brome, MD SOURCE: Patient, notes from Dr. Tobie Poet, notes from Dr. Jacqulynn Cadet, imaging, study and lab reports, multiple MRI images personally reviewed  _________________________________   HISTORICAL  CHIEF COMPLAINT:  Chief Complaint  Patient presents with   Follow-up    Referred to evaluate vision concerns. Overall been stable until the past year she has noted a sharp stabbing pain that would occur intermittently. Last Thursday she devleoped blurred vision and the double vision comes and goes and seems to be specific to the left eye    HISTORY OF PRESENT ILLNESS:  Sheri Moore is a 41 y.o. woman with numbness, fatigue, cognitive changes and abnormal brain MRI.  Update 04/22/2022 She is a 41 yo woman with a history of intermitttent neurologic symptoms and mildly abnormal MRI (last MRI 05/12/2021; few non-specific white matter foci - not typical demyelinatin pattern stable compared to 2020)  She began to experience OS blurry vision and monocular diplopia OS.    She felt the vision is about the same now as last week.     She notes an ache behind the left eye, worse when she looks around.  At optometry 04/20/2022 she had VA 20/30 OD and 20/50 OS.    Colors were desaturated OS (noted at optometry).   Undilated eye exam showed normal disc.        She had breast cancer and finished [facility] end of March 2023 and started [facility] April 2023..     I had seen her in the past x 2 in 2022 and 2020 when she had a heavy sensation in her arms and tingling in the left arm with normal gait and balance.  In between the episodes she was back to her baseline.  Additionally, the past she reported cognitive issues with delayed processing (mostly when other people talk to her).  She usually does better coming up with words.    Nuvigil helped her some initially but not after a while and she stopped.    Adderall  has helped her some.    The MRI images were evaluated.  Despite the imaging report stating probable MS, changes are more c/w chronic microvascular ischemic change.  MRI was similarly abnl for her age in 2004 and only minimal progression over the years.  Recent labs 04/2021:  ANA, B12, Vit D, TSH, CMP , CBC all normal or noncontributary   History of neurologic symptoms and evaluation:  She began to experience vertigo and headaches about 2004.    She had a brain MRI performedshowing white matter abnormalities and saw Dr. Jannifer Franklin.   An LP was performed and CSF was normal in 2004.    She had a post-LP headache and a blood patch was needed (03/11/2003).  Due to an increased number of white matter foci on brain MRI, she was referred to Dr. Jacqulynn Cadet.   At some point in time a second LP was performed and it was also negative.    She was seen by Dr. Dellis Filbert at [facility] between 2006 and 2009 and had a couple of MRI scans performed.  Besides the vertigo, she also reported a mental fog around that time with reduced focus and attention.   She was not definitively diagnosed with multiple sclerosis and was never on a disease modifying therapy.  Around October 2019, she had the onset of lip tingling bilaterally and then more of the face and the left arm  had tingling.     The arm tingling is constant while the facial tingling is intermittent.   The right arm is sometimes feeling heavy but no tingling/numbness.   Legs were fine.  Bladder function was normal.  Visual acuity was normal with glasses.  Color vision was normal.  I saw her in 2020 for the symptoms and again in 2022 with similar symptoms.  MRI of the brain 05/13/2021 was unchanged compared to 10/27/2018 (see below)  DATA Personally reviewed: MRI of the brain 05/12/2021 showed the same pattern of subcortical and deep white matter foci.  These are nonspecific.  There were no acute findings and no change compared to 2020.  MRI of the brain 10/27/2018 was personally  reviewed and compared to a study 11/15/2007 and 02/25/2005.  There are multiple T2/flair hyperintense foci predominantly in the subcortical white matter.  Some IN the deep white matter and a couple are periventricular though none are in the callosal septal fibers.   Compared to MRIs from 11/15/2007 another MRI from 2007, there has been some progression over the last 10 years.  No foci are noted in the brainstem or cerebellum.  MRI of the cervical spie 11/15/2018 showed a normal spinal cord and no significant DJD  SSEP, VEP 07/05/2003 was normal  Labs 06/10/2003 and 06/12/03:  Anti cardiolipin, Prot C, Prot S, Facotr V Lieden, TPA, AT III, Lyme, ANA, B12, ESR    EMG/NCV 10/17/2018 Impression: This NCV/EMG study shows the following: 1.   Minimal left ulnar neuropathy across the wrist. 2.   There is no definite evidence of radiculopathy though mild radiculopathies cannot always be detected by EMG.  Her grandfather had Wegener's disease and an MI.  Her maternal grandmother had a stroke  REVIEW OF SYSTEMS: Constitutional: No fevers, chills, sweats, or change in appetite.  She has fatigue.  She has restless leg syndrome. Eyes: No visual changes, double vision, eye pain Ear, nose and throat: No hearing loss, ear pain, nasal congestion, sore throat Cardiovascular: No chest pain, palpitations Respiratory:  No shortness of breath at rest or with exertion.   No wheezes GastrointestinaI: No nausea, vomiting, diarrhea, abdominal pain, fecal incontinence Genitourinary:  No dysuria, urinary retention or frequency.  No nocturia. Musculoskeletal:  No neck pain, back pain Integumentary: No rash, pruritus, skin lesions Neurological: as above Psychiatric: No depression at this time.  No anxiety Endocrine: No palpitations, diaphoresis, change in appetite, change in weigh or increased thirst Hematologic/Lymphatic:  No anemia, purpura, petechiae. Allergic/Immunologic: No itchy/runny eyes, nasal congestion, recent allergic  reactions, rashes  ALLERGIES: No Known Allergies  HOME MEDICATIONS:  Current Outpatient Medications:    amphetamine-dextroamphetamine (ADDERALL XR) 25 MG 24 hr capsule, TAKE 1 CAPSULE BY MOUTH EVERY MORNING., Disp: 30 capsule, Rfl: 0   escitalopram (LEXAPRO) 10 MG tablet, Take 1 tablet (10 mg total) by mouth daily., Disp: 90 tablet, Rfl: 0   fluticasone (FLONASE) 50 MCG/ACT nasal spray, Place 2 sprays into both nostrils daily., Disp: , Rfl:    letrozole (FEMARA) 2.5 MG tablet, Take 1 tablet (2.5 mg total) by mouth daily., Disp: 30 tablet, Rfl: 3   loratadine (CLARITIN) 10 MG tablet, Take 10 mg by mouth daily., Disp: , Rfl:    LORazepam (ATIVAN) 0.5 MG tablet, Take 1 tablet (0.5 mg total) by mouth every 8 (eight) hours. (Patient taking differently: Take 0.5 mg by mouth every 8 (eight) hours as needed for anxiety.), Disp: 30 tablet, Rfl: 1   predniSONE (DELTASONE) 50 MG tablet, Take 12  tablets (600 mg) by mouth daily for 3 days for optic neuritis, Disp: 36 tablet, Rfl: 0   Semaglutide-Weight Management (WEGOVY) 0.5 MG/0.5ML SOAJ, Inject 0.5 mg into the skin once a week., Disp: 2 mL, Rfl: 0  PAST MEDICAL HISTORY: Past Medical History:  Diagnosis Date   Anxiety    Breast cancer ([place]) 07/06/21   Right Breast, first seen on mammogram done 07/06/21   Depression    Family history of breast cancer    Family history of melanoma    Family history of uterine cancer    Fibroid    Gastroschisis     PAST SURGICAL HISTORY: Past Surgical History:  Procedure Laterality Date   ABDOMINAL HYSTERECTOMY     supracervical hysterectomy   ABDOMINAL SURGERY     as a newborn, gastrogheschesis   BREAST LUMPECTOMY WITH RADIOACTIVE SEED AND SENTINEL LYMPH NODE BIOPSY Right 09/10/2021   Procedure: RIGHT BREAST LUMPECTOMY WITH RADIOACTIVE SEED AND SENTINEL LYMPH NODE BIOPSY;  Surgeon: Stark Klein, MD;  Location: [place];  Service: General;  Laterality: Right;   OOPHORECTOMY      FAMILY HISTORY: Family  History  Problem Relation Age of Onset   Uterine cancer Mother 86   Diabetes Mother    Hypertension Mother    Thyroid disease Mother    Obesity Mother    Subarachnoid hemorrhage Father    Melanoma Father 18   Diabetes Father    Diabetes Sister    Obesity Sister    Diabetes Maternal Grandmother    Hypertension Maternal Grandmother    Melanoma Paternal Grandfather 32   Diabetes Paternal Grandfather    Breast cancer Other 23       MGMs sister   Breast cancer Other 5       MGF's sister   Breast cancer Other        dx < 70; PGMs sister    SOCIAL HISTORY:  Social History   Socioeconomic History   Marital status: Married    Spouse name: Not on file   Number of children: 1   Years of education: College   Highest education level: Not on file  Occupational History   Occupation: [employer]  Tobacco Use   Smoking status: Former    Packs/day: 0.25    Years: 3.00    Total pack years: 0.75    Types: Cigarettes    Quit date: 10/11/2001    Years since quitting: 20.5   Smokeless tobacco: Former  Scientific laboratory technician Use: Never used  Substance and Sexual Activity   Alcohol use: Not Currently    Comment: occ   Drug use: No   Sexual activity: Yes    Partners: Female    Birth control/protection: Surgical    Comment: supracervical hysterectomy  Other Topics Concern   Not on file  Social History Narrative   Lives with husband and son   Caffeine use: rarely   Right handed    Social Determinants of Health   Financial Resource Strain: Not on file  Food Insecurity: Not on file  Transportation Needs: Not on file  Physical Activity: Not on file  Stress: Not on file  Social Connections: Not on file  Intimate Partner Violence: Not At Risk (09/08/2021)   Humiliation, Afraid, Rape, and Kick questionnaire    Fear of Current or Ex-Partner: No    Emotionally Abused: No    Physically Abused: No    Sexually Abused: No     PHYSICAL EXAM  Vitals:   04/22/22 1548  BP:  127/90  Pulse: 81  Weight: 158 lb (71.7 kg)  Height: _0  (1.651 m)    Body mass index is 26.29 kg/m.  VA:   OD 20/25-1    OS 20/40-2     (does not use correction)  General: The patient is well-developed and well-nourished and in no acute distress  HEENT:   Normocephalic and atraumatic.  Funduscopic examination showed normal optic discs and retinal vessels.  Skin: Extremities are without rash or edema  Neurologic Exam  Mental status: The patient is alert and oriented x 3 at the time of the examination. The patient has apparent normal recent and remote memory, with an apparently normal attention span and concentration ability.   Speech is normal.  Cranial nerves: Extraocular movements are full.   Pupils react normally to light and accommodation.  Normal facial strength.  The tongue is midline, and the patient has symmetric elevation of the soft palate. No obvious hearing deficits are noted.  Motor:  Muscle bulk is normal.   Tone is normal. Strength is  5 / 5 in all 4 extremities including the ulnar innervated hand muscles.  Sensory: She has a Tinel sign at the left elbow.  Today, sensation is symmetric.  Coordination: Finger-nose-finger and heel-to-shin was performed well.  Gait and station: Station is normal.   Gait and tandem gait are normal. Romberg is negative.   Reflexes: Deep tendon reflexes are symmetric and normal bilaterally.      DIAGNOSTIC DATA (LABS, IMAGING, TESTING) - I reviewed patient records, labs, notes, testing and imaging myself where available.  Lab Results  Component Value Date   WBC 6.2 02/22/2022   HGB 14.7 02/22/2022   HCT 44.0 02/22/2022   MCV 86 02/22/2022   PLT 301 02/22/2022      Component Value Date/Time   NA 144 02/22/2022 1127   K 4.8 02/22/2022 1127   CL 103 02/22/2022 1127   CO2 24 02/22/2022 1127   GLUCOSE 103 (H) 02/22/2022 1127   GLUCOSE 113 (H) 12/11/2021 1610   BUN 13 02/22/2022 1127   CREATININE 0.64 02/22/2022 1127    CREATININE 0.72 12/11/2021 1610   CALCIUM 9.6 02/22/2022 1127   PROT 6.8 02/22/2022 1127   ALBUMIN 4.5 02/22/2022 1127   AST 25 02/22/2022 1127   AST 22 12/11/2021 1610   ALT 28 02/22/2022 1127   ALT 27 12/11/2021 1610   ALKPHOS 124 (H) 02/22/2022 1127   BILITOT 0.3 02/22/2022 1127   BILITOT 0.7 12/11/2021 1610   GFRNONAA >60 12/11/2021 1610   GFRAA 104 09/30/2020 1556   Lab Results  Component Value Date   CHOL 191 02/22/2022   HDL 52 02/22/2022   LDLCALC 109 (H) 02/22/2022   TRIG 175 (H) 02/22/2022   CHOLHDL 3.7 02/22/2022   No results found for: "HGBA1C" Lab Results  Component Value Date   VITAMINB12 457 04/16/2021   Lab Results  Component Value Date   TSH 1.030 12/23/2021       ASSESSMENT AND PLAN  Optic neuritis - Plan: Sedimentation rate, C-reactive protein, ANA w/Reflex, Anti-MOG, Serum, Neuromyelitis optica autoab, IgG, MR BRAIN W WO CONTRAST, MR ORBITS W WO CONTRAST  White matter abnormality on MRI of brain - Plan: MR BRAIN W WO CONTRAST, MR ORBITS W WO CONTRAST  Malignant neoplasm of upper-outer quadrant of right breast in female, estrogen receptor positive ([place]) - Plan: MR BRAIN W WO CONTRAST, MR ORBITS W WO CONTRAST  1.  Current symptoms are consistent with optic neuritis and I will have her do 3 days of high-dose oral steroids (600 mg daily x3 days).  She has known white matter foci though the pattern is nonspecific and would not be typical for MS.  However, given the current visual symptoms.  We need to recheck an MRI of the brain and orbits.  Additionally, I will check blood work including anti-MOG and anti-NMO for causes of optic neuritis.   2.     She will return to see me in 6 months or sooner if there are new or worsening neurologic symptoms.  42-minute office visit with the majority of the time spent face-to-face for history and physical, discussion/counseling and decision-making.  Additional time with record review, imaging review and  documentation.  Vivien Barretto A. Felecia Shelling, MD, PhD, FAAN Certified in Neurology, Clinical Neurophysiology, Sleep Medicine, Pain Medicine and Neuroimaging Director, [place] at [facility] [address], [place], [place] [address] [phone]

## 2022-04-22 NOTE — Telephone Encounter (Signed)
Rescheduled upcoming appointment due to provider's template. Patient is aware of changes. 

## 2022-04-26 LAB — C-REACTIVE PROTEIN: CRP: 2 mg/L (ref 0–10)

## 2022-04-26 LAB — NEUROMYELITIS OPTICA AUTOAB, IGG: NMO IgG Autoantibodies: <1.5 U/mL (ref 0.0–3.0)

## 2022-04-26 LAB — SEDIMENTATION RATE: Sed Rate: 6 mm/hr (ref 0–32)

## 2022-04-26 LAB — ANA W/REFLEX: Anti Nuclear Antibody (ANA): NEGATIVE

## 2022-04-26 LAB — ANTI-MOG, SERUM: MOG Antibody, Cell-based IFA: NEGATIVE

## 2022-04-28 ENCOUNTER — Telehealth: Payer: Self-pay | Admitting: Neurology

## 2022-04-28 NOTE — Telephone Encounter (Signed)
60 mins MR orbits w/wo & MR brain w/wo Dr. Kathyrn Sheriff: [id number] exp. 04/29/22-05/29/22 scheduled at [facility] 05/04/22 at 7:30am

## 2022-04-28 NOTE — Telephone Encounter (Signed)
I reviewed the notes from optometry 04/20/2022.  Visual acuity was 20/30 OD and 20/50 OS funduscopic examination was normal.  OCT/scan major showed edema.   Average thickness of the RNFL was 102 mm OD and 104 mm OS.  Average thickness of the macular/retinal was 249 mm OD and 245 mm OS.

## 2022-05-02 ENCOUNTER — Other Ambulatory Visit: Payer: Self-pay | Admitting: Hematology

## 2022-05-03 ENCOUNTER — Other Ambulatory Visit (HOSPITAL_COMMUNITY): Payer: Self-pay

## 2022-05-03 MED ORDER — LETROZOLE 2.5 MG PO TABS
2.5000 mg | ORAL_TABLET | Freq: Every day | ORAL | 3 refills | Status: DC
  Filled 2022-05-03: qty 30, 30d supply, fill #0
  Filled 2022-06-05: qty 30, 30d supply, fill #1

## 2022-05-04 ENCOUNTER — Ambulatory Visit: Payer: No Typology Code available for payment source

## 2022-05-04 DIAGNOSIS — H469 Unspecified optic neuritis: Secondary | ICD-10-CM

## 2022-05-04 DIAGNOSIS — C50411 Malignant neoplasm of upper-outer quadrant of right female breast: Secondary | ICD-10-CM

## 2022-05-04 DIAGNOSIS — Z170 Estrogen receptor positive status [ER+]: Secondary | ICD-10-CM

## 2022-05-04 DIAGNOSIS — R9082 White matter disease, unspecified: Secondary | ICD-10-CM | POA: Diagnosis not present

## 2022-05-04 MED ORDER — GADOBENATE DIMEGLUMINE 529 MG/ML IV SOLN
15.0000 mL | Freq: Once | INTRAVENOUS | Status: AC | PRN
  Administered 2022-05-04: 15 mL via INTRAVENOUS

## 2022-05-05 ENCOUNTER — Encounter: Payer: Self-pay | Admitting: Nurse Practitioner

## 2022-05-05 ENCOUNTER — Other Ambulatory Visit (HOSPITAL_COMMUNITY): Payer: Self-pay

## 2022-05-05 ENCOUNTER — Other Ambulatory Visit: Payer: Self-pay | Admitting: Nurse Practitioner

## 2022-05-05 DIAGNOSIS — E663 Overweight: Secondary | ICD-10-CM

## 2022-05-05 DIAGNOSIS — E782 Mixed hyperlipidemia: Secondary | ICD-10-CM

## 2022-05-05 MED ORDER — WEGOVY 1 MG/0.5ML ~~LOC~~ SOAJ
1.0000 mg | SUBCUTANEOUS | 0 refills | Status: DC
  Filled 2022-05-05: qty 2, 28d supply, fill #0

## 2022-05-10 ENCOUNTER — Other Ambulatory Visit: Payer: Self-pay | Admitting: Nurse Practitioner

## 2022-05-10 ENCOUNTER — Other Ambulatory Visit (HOSPITAL_COMMUNITY): Payer: Self-pay

## 2022-05-10 DIAGNOSIS — F418 Other specified anxiety disorders: Secondary | ICD-10-CM

## 2022-05-10 DIAGNOSIS — F900 Attention-deficit hyperactivity disorder, predominantly inattentive type: Secondary | ICD-10-CM

## 2022-05-10 MED ORDER — ESCITALOPRAM OXALATE 10 MG PO TABS
10.0000 mg | ORAL_TABLET | Freq: Every day | ORAL | 0 refills | Status: DC
  Filled 2022-05-10: qty 90, 90d supply, fill #0

## 2022-05-10 MED ORDER — AMPHETAMINE-DEXTROAMPHET ER 25 MG PO CP24
ORAL_CAPSULE | Freq: Every morning | ORAL | 0 refills | Status: DC
  Filled 2022-05-10: qty 30, 30d supply, fill #0

## 2022-05-11 ENCOUNTER — Other Ambulatory Visit: Payer: Self-pay | Admitting: Family Medicine

## 2022-05-11 DIAGNOSIS — H469 Unspecified optic neuritis: Secondary | ICD-10-CM

## 2022-05-17 ENCOUNTER — Encounter: Payer: Self-pay | Admitting: Nurse Practitioner

## 2022-05-17 ENCOUNTER — Ambulatory Visit (INDEPENDENT_AMBULATORY_CARE_PROVIDER_SITE_OTHER): Payer: No Typology Code available for payment source | Admitting: Nurse Practitioner

## 2022-05-17 VITALS — BP 126/76 | HR 108 | Temp 96.8°F | Ht 65.0 in | Wt 155.0 lb

## 2022-05-17 DIAGNOSIS — N3001 Acute cystitis with hematuria: Secondary | ICD-10-CM

## 2022-05-17 LAB — POCT URINALYSIS DIP (CLINITEK)
Glucose, UA: NEGATIVE mg/dL
Nitrite, UA: POSITIVE — AB
POC PROTEIN,UA: 100 — AB
Spec Grav, UA: >=1.03 — AB (ref 1.010–1.025)
Urobilinogen, UA: 0.2 E.U./dL
pH, UA: 5.5 (ref 5.0–8.0)

## 2022-05-17 MED ORDER — NITROFURANTOIN MONOHYD MACRO 100 MG PO CAPS: 100.0000 mg | ORAL_CAPSULE | Freq: Two times a day (BID) | ORAL | 0 refills | Status: DC

## 2022-05-17 NOTE — Progress Notes (Signed)
Acute Office Visit  Subjective:    Patient ID: Sheri Moore, female    DOB: 1981-07-31, 41 y.o.   MRN: [id number]  Chief Complaint  Patient presents with   Urinary Tract Infection    HPI: Patient is in today for Urinary symptoms  She reports new onset dysuria and urinary frequency. Denies fever, chills, or back pain.The current episode started  3 days ago and is worsening. Patient states symptoms are moderate in intensity, occurring constantly. Treatment has included pushing fluids.She  has not been recently treated for similar symptoms.       Past Medical History:  Diagnosis Date   Anxiety    Breast cancer ([place]) 07/06/21   Right Breast, first seen on mammogram done 07/06/21   Depression    Family history of breast cancer    Family history of melanoma    Family history of uterine cancer    Fibroid    Gastroschisis     Past Surgical History:  Procedure Laterality Date   ABDOMINAL HYSTERECTOMY     supracervical hysterectomy   ABDOMINAL SURGERY     as a newborn, gastrogheschesis   BREAST LUMPECTOMY WITH RADIOACTIVE SEED AND SENTINEL LYMPH NODE BIOPSY Right 09/10/2021   Procedure: RIGHT BREAST LUMPECTOMY WITH RADIOACTIVE SEED AND SENTINEL LYMPH NODE BIOPSY;  Surgeon: Stark Klein, MD;  Location: [facility];  Service: General;  Laterality: Right;   OOPHORECTOMY      Family History  Problem Relation Age of Onset   Uterine cancer Mother 14   Diabetes Mother    Hypertension Mother    Thyroid disease Mother    Obesity Mother    Subarachnoid hemorrhage Father    Melanoma Father 73   Diabetes Father    Diabetes Sister    Obesity Sister    Diabetes Maternal Grandmother    Hypertension Maternal Grandmother    Melanoma Paternal Grandfather 56   Diabetes Paternal Grandfather    Breast cancer Other 39       MGMs sister   Breast cancer Other 35       MGF's sister   Breast cancer Other        dx < 31; PGMs sister    Social History   Socioeconomic History   Marital  status: Married    Spouse name: Not on file   Number of children: 1   Years of education: College   Highest education level: Not on file  Occupational History   Occupation: [employer]  Tobacco Use   Smoking status: Former    Packs/day: 0.25    Years: 3.00    Total pack years: 0.75    Types: Cigarettes    Quit date: 10/11/2001    Years since quitting: 20.6   Smokeless tobacco: Former  Scientific laboratory technician Use: Never used  Substance and Sexual Activity   Alcohol use: Not Currently    Comment: occ   Drug use: No   Sexual activity: Yes    Partners: Female    Birth control/protection: Surgical    Comment: supracervical hysterectomy  Other Topics Concern   Not on file  Social History Narrative   Lives with husband and son   Caffeine use: rarely   Right handed    Social Determinants of Health   Financial Resource Strain: Not on file  Food Insecurity: Not on file  Transportation Needs: Not on file  Physical Activity: Not on file  Stress: Not on file  Social Connections: Not  on file  Intimate Partner Violence: Not At Risk (09/08/2021)   Humiliation, Afraid, Rape, and Kick questionnaire    Fear of Current or Ex-Partner: No    Emotionally Abused: No    Physically Abused: No    Sexually Abused: No    Outpatient Medications Prior to Visit  Medication Sig Dispense Refill   amphetamine-dextroamphetamine (ADDERALL XR) 25 MG 24 hr capsule TAKE 1 CAPSULE BY MOUTH EVERY MORNING. 30 capsule 0   escitalopram (LEXAPRO) 10 MG tablet Take 1 tablet (10 mg total) by mouth daily. 90 tablet 0   fluticasone (FLONASE) 50 MCG/ACT nasal spray Place 2 sprays into both nostrils daily.     letrozole (FEMARA) 2.5 MG tablet Take 1 tablet (2.5 mg total) by mouth daily. 30 tablet 3   loratadine (CLARITIN) 10 MG tablet Take 10 mg by mouth daily.     LORazepam (ATIVAN) 0.5 MG tablet Take 1 tablet (0.5 mg total) by mouth every 8 (eight) hours. (Patient taking differently: Take 0.5 mg by mouth  every 8 (eight) hours as needed for anxiety.) 30 tablet 1   Semaglutide-Weight Management (WEGOVY) 1 MG/0.5ML SOAJ Inject 1 mg into the skin once a week. 2 mL 0   predniSONE (DELTASONE) 50 MG tablet Take 12 tablets (600 mg) by mouth daily for 3 days for optic neuritis 36 tablet 0   No facility-administered medications prior to visit.    No Known Allergies  Review of Systems    See pertinent positives and negatives per HPI.  Objective:    Physical Exam Vitals reviewed.  Constitutional:      Appearance: Normal appearance.  Abdominal:     Tenderness: There is no right CVA tenderness or left CVA tenderness.  Skin:    General: Skin is warm and dry.     Capillary Refill: Capillary refill takes less than 2 seconds.  Neurological:     Mental Status: She is alert.     BP 126/76   Pulse (!) 108   Temp (!) 96.8 F (36 C)   Ht '5\' 5"'  (1.651 m)   Wt 155 lb (70.3 kg)   SpO2 97%   BMI 25.79 kg/m   Wt Readings from Last 3 Encounters:  05/17/22 155 lb (70.3 kg)  04/22/22 158 lb (71.7 kg)  02/19/22 158 lb (71.7 kg)    Health Maintenance Due  Topic Date Due   PAP SMEAR-Modifier  11/18/2020   COVID-19 Vaccine (3 - Moderna risk series) 03/09/2021   INFLUENZA VACCINE  05/11/2022       Lab Results  Component Value Date   TSH 1.030 12/23/2021   Lab Results  Component Value Date   WBC 6.2 02/22/2022   HGB 14.7 02/22/2022   HCT 44.0 02/22/2022   MCV 86 02/22/2022   PLT 301 02/22/2022   Lab Results  Component Value Date   NA 144 02/22/2022   K 4.8 02/22/2022   CO2 24 02/22/2022   GLUCOSE 103 (H) 02/22/2022   BUN 13 02/22/2022   CREATININE 0.64 02/22/2022   BILITOT 0.3 02/22/2022   ALKPHOS 124 (H) 02/22/2022   AST 25 02/22/2022   ALT 28 02/22/2022   PROT 6.8 02/22/2022   ALBUMIN 4.5 02/22/2022   CALCIUM 9.6 02/22/2022   ANIONGAP 8 12/11/2021   EGFR 113 02/22/2022   Lab Results  Component Value Date   CHOL 191 02/22/2022   Lab Results  Component Value Date    HDL 52 02/22/2022   Lab Results  Component Value Date  LDLCALC 109 (H) 02/22/2022   Lab Results  Component Value Date   TRIG 175 (H) 02/22/2022   Lab Results  Component Value Date   CHOLHDL 3.7 02/22/2022        Assessment & Plan:    1. Acute cystitis with hematuria - Urine Culture - POCT URINALYSIS DIP (CLINITEK) - nitrofurantoin, macrocrystal-monohydrate, (MACROBID) 100 MG capsule; Take 1 capsule (100 mg total) by mouth 2 (two) times daily.  Dispense: 14 capsule; Refill: 0    Push fluids, especially water Follow-up as needed     Follow-up: PRN  An After Visit Summary was printed and given to the patient.  I, Rip Harbour, NP, have reviewed all documentation for this visit. The documentation on 05/17/22 for the exam, diagnosis, procedures, and orders are all accurate and complete.   Signed, Rip Harbour, NP [place] [phone]

## 2022-05-19 LAB — URINE CULTURE

## 2022-06-02 ENCOUNTER — Other Ambulatory Visit (HOSPITAL_COMMUNITY): Payer: Self-pay

## 2022-06-02 ENCOUNTER — Ambulatory Visit (INDEPENDENT_AMBULATORY_CARE_PROVIDER_SITE_OTHER): Payer: No Typology Code available for payment source | Admitting: Nurse Practitioner

## 2022-06-02 ENCOUNTER — Encounter: Payer: Self-pay | Admitting: Nurse Practitioner

## 2022-06-02 VITALS — BP 124/78 | HR 82 | Temp 97.4°F | Resp 16 | Ht 65.0 in | Wt 160.1 lb

## 2022-06-02 DIAGNOSIS — Z0000 Encounter for general adult medical examination without abnormal findings: Secondary | ICD-10-CM

## 2022-06-02 DIAGNOSIS — Z6826 Body mass index (BMI) 26.0-26.9, adult: Secondary | ICD-10-CM | POA: Diagnosis not present

## 2022-06-02 MED ORDER — WEGOVY 1.7 MG/0.75ML ~~LOC~~ SOAJ
1.7000 mg | SUBCUTANEOUS | 0 refills | Status: DC
  Filled 2022-06-02: qty 3, 28d supply, fill #0

## 2022-06-02 NOTE — Progress Notes (Deleted)
Subjective:  Patient ID: Sheri Moore, female    DOB: November 23, 1980  Age: 41 y.o. MRN: [id number]  Chief Complaint  Patient presents with   Hyperlipidemia   Anxiety   Depression    HPI    Sheri Moore is a 41 year old Caucasian female that presents for follow-up of hyperlipidemia, depression, and anxiety. She has chronic insomnia. Recently completed radiation treatment for breast cancer.  Discontinue Ambien Begin Belsomra 10 mg for insomnia Follow-up in 4-weeks for insomnia  Lipid/Cholesterol, Follow-up  Last lipid panel Other pertinent labs  Lab Results  Component Value Date   CHOL 191 02/22/2022   HDL 52 02/22/2022   LDLCALC 109 (H) 02/22/2022   TRIG 175 (H) 02/22/2022   CHOLHDL 3.7 02/22/2022   Lab Results  Component Value Date   ALT 28 02/22/2022   AST 25 02/22/2022   PLT 301 02/22/2022   TSH 1.030 12/23/2021     She was last seen for this {1-12:18279} {days/wks/mos/yrs:310907} ago.  Management includes ***.  She reports {excellent/good/fair/poor:19665} compliance with treatment. She {is/is not:9024} having side effects. {document side effects if present:1}  Current diet: {diet habits:16563} Current exercise: {exercise types:16438}  Depression, Follow-up  She  was last seen for this {NUMBERS 1-12:18279} {days/wks/mos/yrs:310907} ago. Current treatment includes ***.   She reports {excellent/good/fair/poor:19665} compliance with treatment. She {is/is not:21021397} having side effects. ***  She reports {DESC; GOOD/FAIR/POOR:18685} tolerance of treatment. Current symptoms include: {Symptoms; depression:1002} She feels she is {improved/worse/unchanged:3041574} since last visit.     06/02/2022    2:21 PM 02/19/2022   11:37 AM 11/16/2021    1:59 PM  Depression screen PHQ 2/9  Decreased Interest 0 0 3  Down, Depressed, Hopeless 0 0 3  PHQ - 2 Score 0 0 6  Altered sleeping 0 3 2  Tired, decreased energy '1 3 2  '$ Change in appetite 0 0 1  Feeling bad or failure about  yourself  0 0 2  Trouble concentrating '1 3 2  '$ Moving slowly or fidgety/restless 0 0 1  Suicidal thoughts 0 0 0  PHQ-9 Score '2 9 16  '$ Difficult doing work/chores Not difficult at all Very difficult Somewhat difficult    Anxiety, Follow-up  She was last seen for anxiety {NUMBERS 1-12:18279} {days/wks/mos/yrs:310907} ago. Current treatment includes ***.   She reports {excellent/good/fair/poor:19665} compliance with treatment. She reports {good/fair/poor:18685} tolerance of treatment. She {is/is not:21021397} having side effects. {document side effects if present:1}  She feels her anxiety is {Desc; severity:60313} and {improved/worse/unchanged:3041574} since last visit.   GAD-7 Results    06/02/2022    2:23 PM 11/16/2021    1:59 PM 04/20/2021    1:25 PM  GAD-7 Generalized Anxiety Disorder Screening Tool  1. Feeling Nervous, Anxious, or on Edge 0 0 0  2. Not Being Able to Stop or Control Worrying 0 0 0  3. Worrying Too Much About Different Things 0 0 0  4. Trouble Relaxing 0 0 0  5. Being So Restless it's Hard To Sit Still 0 0 0  6. Becoming Easily Annoyed or Irritable '1 3 2  '$ 7. Feeling Afraid As If Something Awful Might Happen 0 0 0  Total GAD-7 Score '1 3 2  '$ Difficulty At Work, Home, or Getting  Along With Others? Not difficult at all Somewhat difficult Not difficult at all    PHQ-9 Scores    06/02/2022    2:21 PM 02/19/2022   11:37 AM 11/16/2021    1:59 PM  PHQ9 SCORE ONLY  PHQ-9 Total Score  $'2 9 16     'M$ Insomnia: Currently on ambien, patient states she does not get a good nights sleep, wakes up fatigued. Current Outpatient Medications on File Prior to Visit  Medication Sig Dispense Refill   amphetamine-dextroamphetamine (ADDERALL XR) 25 MG 24 hr capsule TAKE 1 CAPSULE BY MOUTH EVERY MORNING. 30 capsule 0   escitalopram (LEXAPRO) 10 MG tablet Take 1 tablet (10 mg total) by mouth daily. 90 tablet 0   fluticasone (FLONASE) 50 MCG/ACT nasal spray Place 2 sprays into both nostrils  daily.     letrozole (FEMARA) 2.5 MG tablet Take 1 tablet (2.5 mg total) by mouth daily. 30 tablet 3   loratadine (CLARITIN) 10 MG tablet Take 10 mg by mouth daily.     LORazepam (ATIVAN) 0.5 MG tablet Take 1 tablet (0.5 mg total) by mouth every 8 (eight) hours. (Patient taking differently: Take 0.5 mg by mouth every 8 (eight) hours as needed for anxiety.) 30 tablet 1   Semaglutide-Weight Management (WEGOVY) 1 MG/0.5ML SOAJ Inject 1 mg into the skin once a week. 2 mL 0   No current facility-administered medications on file prior to visit.   Past Medical History:  Diagnosis Date   Anxiety    Breast cancer ([place]) 07/06/21   Right Breast, first seen on mammogram done 07/06/21   Depression    Family history of breast cancer    Family history of melanoma    Family history of uterine cancer    Fibroid    Gastroschisis    Past Surgical History:  Procedure Laterality Date   ABDOMINAL HYSTERECTOMY     supracervical hysterectomy   ABDOMINAL SURGERY     as a newborn, gastrogheschesis   BREAST LUMPECTOMY WITH RADIOACTIVE SEED AND SENTINEL LYMPH NODE BIOPSY Right 09/10/2021   Procedure: RIGHT BREAST LUMPECTOMY WITH RADIOACTIVE SEED AND SENTINEL LYMPH NODE BIOPSY;  Surgeon: Stark Klein, MD;  Location: [facility];  Service: General;  Laterality: Right;   OOPHORECTOMY      Family History  Problem Relation Age of Onset   Uterine cancer Mother 34   Diabetes Mother    Hypertension Mother    Thyroid disease Mother    Obesity Mother    Subarachnoid hemorrhage Father    Melanoma Father 55   Diabetes Father    Diabetes Sister    Obesity Sister    Diabetes Maternal Grandmother    Hypertension Maternal Grandmother    Melanoma Paternal Grandfather 65   Diabetes Paternal Grandfather    Breast cancer Other 37       MGMs sister   Breast cancer Other 21       MGF's sister   Breast cancer Other        dx < 59; PGMs sister   Social History   Socioeconomic History   Marital status: Married    Spouse  name: Not on file   Number of children: 1   Years of education: College   Highest education level: Not on file  Occupational History   Occupation: [employer]  Tobacco Use   Smoking status: Former    Packs/day: 0.25    Years: 3.00    Total pack years: 0.75    Types: Cigarettes    Quit date: 10/11/2001    Years since quitting: 20.6   Smokeless tobacco: Former  Scientific laboratory technician Use: Never used  Substance and Sexual Activity   Alcohol use: Not Currently    Comment: occ   Drug use:  No   Sexual activity: Yes    Partners: Female    Birth control/protection: Surgical    Comment: supracervical hysterectomy  Other Topics Concern   Not on file  Social History Narrative   Lives with husband and son   Caffeine use: rarely   Right handed    Social Determinants of Health   Financial Resource Strain: Not on file  Food Insecurity: Not on file  Transportation Needs: Not on file  Physical Activity: Not on file  Stress: Not on file  Social Connections: Not on file    Review of Systems  Constitutional:  Negative for chills, fatigue and fever.  HENT:  Negative for congestion, ear pain, rhinorrhea and sore throat.   Respiratory:  Negative for cough and shortness of breath.   Cardiovascular:  Negative for chest pain.  Gastrointestinal:  Negative for abdominal pain, constipation, diarrhea, nausea and vomiting.  Genitourinary:  Negative for dysuria and urgency.  Musculoskeletal:  Negative for back pain and myalgias.  Neurological:  Negative for dizziness, weakness, light-headedness and headaches.  Psychiatric/Behavioral:  Negative for dysphoric mood. The patient is not nervous/anxious.      Objective:  There were no vitals taken for this visit.     05/17/2022    8:19 AM 04/22/2022    3:48 PM 02/19/2022   11:33 AM  BP/Weight  Systolic BP 244 010 272  Diastolic BP 76 90 84  Wt. (Lbs) 155 158 158  BMI 25.79 kg/m2 26.29 kg/m2 27.12 kg/m2    Physical Exam  Diabetic Foot  Exam - Simple   No data filed      Lab Results  Component Value Date   WBC 6.2 02/22/2022   HGB 14.7 02/22/2022   HCT 44.0 02/22/2022   PLT 301 02/22/2022   GLUCOSE 103 (H) 02/22/2022   CHOL 191 02/22/2022   TRIG 175 (H) 02/22/2022   HDL 52 02/22/2022   LDLCALC 109 (H) 02/22/2022   ALT 28 02/22/2022   AST 25 02/22/2022   NA 144 02/22/2022   K 4.8 02/22/2022   CL 103 02/22/2022   CREATININE 0.64 02/22/2022   BUN 13 02/22/2022   CO2 24 02/22/2022   TSH 1.030 12/23/2021      Assessment & Plan:   Problem List Items Addressed This Visit       Other   Depression with anxiety   Attention deficit disorder (ADD) without hyperactivity   Other Visit Diagnoses     Mixed hyperlipidemia    -  Primary   Primary insomnia         .  No orders of the defined types were placed in this encounter.   No orders of the defined types were placed in this encounter.    Follow-up: No follow-ups on file.  An After Visit Summary was printed and given to the patient.  Rip Harbour, NP [place] [phone]

## 2022-06-02 NOTE — Progress Notes (Signed)
Subjective:  Patient ID: Sheri Moore, female    DOB: 26-Oct-1980  Age: 41 y.o. MRN: [id number]  Chief Complaint  Patient presents with   Hyperlipidemia   Anxiety   Depression    Hyperlipidemia  Anxiety    Depression        Past medical history includes anxiety.    Encounter for general adult medical examination without abnormal findings  Physical ("At Risk" items are starred): Patient's last physical exam was 1 year ago .      SDOH Screenings   Alcohol Screen: Not on file  Depression (PHQ2-9): Low Risk  (06/02/2022)   Depression (PHQ2-9)    PHQ-2 Score: 2  Financial Resource Strain: Not on file  Food Insecurity: Not on file  Housing: Not on file  Physical Activity: Not on file  Social Connections: Not on file  Stress: Not on file  Tobacco Use: Medium Risk (06/02/2022)   Patient History    Smoking Tobacco Use: Former    Smokeless Tobacco Use: Former    Passive Exposure: Not on file  Transportation Needs: Not on file       06/02/2022    2:07 PM 04/20/2021    1:24 PM  [place] in the past year? 0 0  Number falls in past yr: 0 0  Injury with Fall? 0 0  Risk for fall due to : No Fall Risks   Follow up Falls evaluation completed Falls evaluation completed       06/02/2022    2:21 PM 02/19/2022   11:37 AM 11/16/2021    1:59 PM 04/20/2021    1:24 PM 09/30/2020    3:26 PM  Depression screen PHQ 2/9  Decreased Interest 0 0 3 0 0  Down, Depressed, Hopeless 0 0 3 0 0  PHQ - 2 Score 0 0 6 0 0  Altered sleeping 0 '3 2 1 1  '$ Tired, decreased energy '1 3 2 3 '$ 0  Change in appetite 0 0 1 0 0  Feeling bad or failure about yourself  0 0 2 0 1  Trouble concentrating '1 3 2 2   '$ Moving slowly or fidgety/restless 0 0 1 0 0  Suicidal thoughts 0 0 0 0 0  PHQ-9 Score '2 9 16 6 2  '$ Difficult doing work/chores Not difficult at all Very difficult Somewhat difficult Somewhat difficult Not difficult at all    Functional Status Survey: Is the patient deaf or have difficulty  hearing?: No Does the patient have difficulty seeing, even when wearing glasses/contacts?: No Does the patient have difficulty concentrating, remembering, or making decisions?: No Does the patient have difficulty walking or climbing stairs?: No Does the patient have difficulty dressing or bathing?: No Does the patient have difficulty doing errands alone such as visiting a doctor's office or shopping?: No   Safety: reviewed ;  Patient wears a seat belt. Patient's home has smoke detectors and carbon monoxide detectors. Patient practices appropriate gun safety Patient wears sunscreen with extended sun exposure. Dental Care: biannual cleanings, brushes and flosses daily. Ophthalmology/Optometry: Annual visit.  Hearing loss: none Vision impairments: wears glasses Patient is not afflicted from Stress Incontinence and Urge Incontinence   Current Outpatient Medications on File Prior to Visit  Medication Sig Dispense Refill   amphetamine-dextroamphetamine (ADDERALL XR) 25 MG 24 hr capsule TAKE 1 CAPSULE BY MOUTH EVERY MORNING. 30 capsule 0   escitalopram (LEXAPRO) 10 MG tablet Take 1 tablet (10 mg total) by mouth daily. 90 tablet  0   fluticasone (FLONASE) 50 MCG/ACT nasal spray Place 2 sprays into both nostrils daily.     letrozole (FEMARA) 2.5 MG tablet Take 1 tablet (2.5 mg total) by mouth daily. 30 tablet 3   loratadine (CLARITIN) 10 MG tablet Take 10 mg by mouth daily.     LORazepam (ATIVAN) 0.5 MG tablet Take 1 tablet (0.5 mg total) by mouth every 8 (eight) hours. (Patient taking differently: Take 0.5 mg by mouth every 8 (eight) hours as needed for anxiety.) 30 tablet 1   No current facility-administered medications on file prior to visit.    Social Hx   Social History   Socioeconomic History   Marital status: Married    Spouse name: Not on file   Number of children: 1   Years of education: College   Highest education level: Not on file  Occupational History   Occupation: [employer]  Tobacco Use   Smoking status: Former    Packs/day: 0.25    Years: 3.00    Total pack years: 0.75    Types: Cigarettes    Quit date: 10/11/2001    Years since quitting: 20.6   Smokeless tobacco: Former  Scientific laboratory technician Use: Never used  Substance and Sexual Activity   Alcohol use: Not Currently    Comment: occ   Drug use: No   Sexual activity: Yes    Partners: Female    Birth control/protection: Surgical    Comment: supracervical hysterectomy  Other Topics Concern   Not on file  Social History Narrative   Lives with husband and son   Caffeine use: rarely   Right handed    Social Determinants of Health   Financial Resource Strain: Not on file  Food Insecurity: Not on file  Transportation Needs: Not on file  Physical Activity: Not on file  Stress: Not on file  Social Connections: Not on file   Past Medical History:  Diagnosis Date   Anxiety    Breast cancer ([place]) 07/06/21   Right Breast, first seen on mammogram done 07/06/21   Depression    Family history of breast cancer    Family history of melanoma    Family history of uterine cancer    Fibroid    Gastroschisis    Family History  Problem Relation Age of Onset   Uterine cancer Mother 43   Diabetes Mother    Hypertension Mother    Thyroid disease Mother    Obesity Mother    Subarachnoid hemorrhage Father    Melanoma Father 75   Diabetes Father    Diabetes Sister    Obesity Sister    Diabetes Maternal Grandmother    Hypertension Maternal Grandmother    Melanoma Paternal Grandfather 16   Diabetes Paternal Grandfather    Breast cancer Other 43       MGMs sister   Breast cancer Other 40       MGF's sister   Breast cancer Other        dx < 28; PGMs sister    Review of Systems  Eyes:  Positive for pain (left eye, intermittent).  Psychiatric/Behavioral:  Positive for depression.   All other systems reviewed and are negative.    Objective:  BP 124/78   Pulse 82   Temp (!) 97.4 F  (36.3 C)   Resp 16   Ht '5\' 5"'$  (1.651 m)   Wt 160 lb 1.6 oz (72.6 kg)   SpO2 98%  BMI 26.64 kg/m      06/02/2022    2:25 PM 05/17/2022    8:19 AM 04/22/2022    3:48 PM  BP/Weight  Systolic BP 202 542 706  Diastolic BP 78 76 90  Wt. (Lbs) 160.1 155 158  BMI 26.64 kg/m2 25.79 kg/m2 26.29 kg/m2    Physical Exam Vitals reviewed.  Constitutional:      Appearance: Normal appearance.  HENT:     Head: Normocephalic.     Right Ear: Tympanic membrane normal.     Left Ear: Tympanic membrane normal.     Nose: Nose normal.     Mouth/Throat:     Mouth: Mucous membranes are moist.  Eyes:     Pupils: Pupils are equal, round, and reactive to light.  Cardiovascular:     Rate and Rhythm: Normal rate and regular rhythm.  Pulmonary:     Effort: Pulmonary effort is normal.     Breath sounds: Normal breath sounds.  Abdominal:     General: Bowel sounds are normal.     Palpations: Abdomen is soft.  Musculoskeletal:        General: Normal range of motion.     Cervical back: Neck supple.  Skin:    General: Skin is warm and dry.     Capillary Refill: Capillary refill takes less than 2 seconds.  Neurological:     General: No focal deficit present.     Mental Status: She is alert and oriented to person, place, and time.  Psychiatric:        Mood and Affect: Mood normal.        Behavior: Behavior normal.     Lab Results  Component Value Date   WBC 6.2 02/22/2022   HGB 14.7 02/22/2022   HCT 44.0 02/22/2022   PLT 301 02/22/2022   GLUCOSE 103 (H) 02/22/2022   CHOL 191 02/22/2022   TRIG 175 (H) 02/22/2022   HDL 52 02/22/2022   LDLCALC 109 (H) 02/22/2022   ALT 28 02/22/2022   AST 25 02/22/2022   NA 144 02/22/2022   K 4.8 02/22/2022   CL 103 02/22/2022   CREATININE 0.64 02/22/2022   BUN 13 02/22/2022   CO2 24 02/22/2022   TSH 1.030 12/23/2021      Assessment & Plan:  1. Annual physical exam - CBC with Differential/Platelet - Comprehensive metabolic panel - Lipid panel -  T4, free - TSH  2. BMI 26.0-26.9,adult - Semaglutide-Weight Management (WEGOVY) 1.7 MG/0.75ML SOAJ; Inject 1.7 mg into the skin once a week.  Dispense: 3 mL; Refill: 0 - CBC with Differential/Platelet - Comprehensive metabolic panel - Lipid panel - T4, free - TSH    Meds ordered this encounter  Medications   Semaglutide-Weight Management (WEGOVY) 1.7 MG/0.75ML SOAJ    Sig: Inject 1.7 mg into the skin once a week.    Dispense:  3 mL    Refill:  0    These are the goals we discussed:  Goals   Weight loss      This is a list of the screening recommended for you and due dates:  Health Maintenance  Topic Date Due   Pap Smear  11/18/2020   COVID-19 Vaccine (3 - Moderna risk series) 03/09/2021   Flu Shot  05/11/2022   Tetanus Vaccine  07/19/2027   Hepatitis C Screening: USPSTF Recommendation to screen - Ages 18-79 yo.  Completed   HIV Screening  Completed   HPV Vaccine  Aged Out  AN INDIVIDUALIZED CARE PLAN: was established or reinforced today.   SELF MANAGEMENT: The patient and I together assessed ways to personally work towards obtaining the recommended goals  Support needs The patient and/or family needs were assessed and services were offered and not necessary at this time.    Follow-up: 1-year  I, Rip Harbour, NP, have reviewed all documentation for this visit. The documentation on 06/02/22 for the exam, diagnosis, procedures, and orders are all accurate and complete.   Signed, [place] [phone]

## 2022-06-02 NOTE — Patient Instructions (Addendum)

## 2022-06-03 ENCOUNTER — Other Ambulatory Visit (HOSPITAL_COMMUNITY): Payer: Self-pay

## 2022-06-03 LAB — CBC WITH DIFFERENTIAL/PLATELET
Basophils Absolute: 0.1 10*3/uL (ref 0.0–0.2)
Basos: 1 %
EOS (ABSOLUTE): 0.1 10*3/uL (ref 0.0–0.4)
Eos: 1 %
Hematocrit: 41.3 % (ref 34.0–46.6)
Hemoglobin: 13.7 g/dL (ref 11.1–15.9)
Immature Grans (Abs): 0 10*3/uL (ref 0.0–0.1)
Immature Granulocytes: 0 %
Lymphocytes Absolute: 1 10*3/uL (ref 0.7–3.1)
Lymphs: 21 %
MCH: 29.1 pg (ref 26.6–33.0)
MCHC: 33.2 g/dL (ref 31.5–35.7)
MCV: 88 fL (ref 79–97)
Monocytes Absolute: 0.3 10*3/uL (ref 0.1–0.9)
Monocytes: 6 %
Neutrophils Absolute: 3.4 10*3/uL (ref 1.4–7.0)
Neutrophils: 71 %
Platelets: 262 10*3/uL (ref 150–450)
RBC: 4.71 x10E6/uL (ref 3.77–5.28)
RDW: 12.8 % (ref 11.7–15.4)
WBC: 4.7 10*3/uL (ref 3.4–10.8)

## 2022-06-03 LAB — COMPREHENSIVE METABOLIC PANEL
ALT: 106 IU/L — ABNORMAL HIGH (ref 0–32)
AST: 73 IU/L — ABNORMAL HIGH (ref 0–40)
Albumin/Globulin Ratio: 2.1 (ref 1.2–2.2)
Albumin: 4.4 g/dL (ref 3.9–4.9)
Alkaline Phosphatase: 91 IU/L (ref 44–121)
BUN/Creatinine Ratio: 18 (ref 9–23)
BUN: 14 mg/dL (ref 6–24)
Bilirubin Total: 0.4 mg/dL (ref 0.0–1.2)
CO2: 20 mmol/L (ref 20–29)
Calcium: 9.4 mg/dL (ref 8.7–10.2)
Chloride: 102 mmol/L (ref 96–106)
Creatinine, Ser: 0.76 mg/dL (ref 0.57–1.00)
Globulin, Total: 2.1 g/dL (ref 1.5–4.5)
Glucose: 102 mg/dL — ABNORMAL HIGH (ref 70–99)
Potassium: 3.9 mmol/L (ref 3.5–5.2)
Sodium: 142 mmol/L (ref 134–144)
Total Protein: 6.5 g/dL (ref 6.0–8.5)
eGFR: 100 mL/min/{1.73_m2} (ref >59)

## 2022-06-03 LAB — CARDIOVASCULAR RISK ASSESSMENT

## 2022-06-03 LAB — LIPID PANEL
Chol/HDL Ratio: 4.3 ratio (ref 0.0–4.4)
Cholesterol, Total: 217 mg/dL — ABNORMAL HIGH (ref 100–199)
HDL: 51 mg/dL (ref >39)
LDL Chol Calc (NIH): 139 mg/dL — ABNORMAL HIGH (ref 0–99)
Triglycerides: 151 mg/dL — ABNORMAL HIGH (ref 0–149)
VLDL Cholesterol Cal: 27 mg/dL (ref 5–40)

## 2022-06-03 LAB — TSH: TSH: 1.51 u[IU]/mL (ref 0.450–4.500)

## 2022-06-03 LAB — T4, FREE: Free T4: 1.09 ng/dL (ref 0.82–1.77)

## 2022-06-05 ENCOUNTER — Other Ambulatory Visit: Payer: Self-pay | Admitting: Nurse Practitioner

## 2022-06-05 DIAGNOSIS — F900 Attention-deficit hyperactivity disorder, predominantly inattentive type: Secondary | ICD-10-CM

## 2022-06-06 MED ORDER — AMPHETAMINE-DEXTROAMPHET ER 25 MG PO CP24
ORAL_CAPSULE | Freq: Every morning | ORAL | 0 refills | Status: DC
  Filled 2022-06-06: qty 30, fill #0
  Filled 2022-06-08: qty 30, 30d supply, fill #0

## 2022-06-07 ENCOUNTER — Other Ambulatory Visit (HOSPITAL_COMMUNITY): Payer: Self-pay

## 2022-06-08 ENCOUNTER — Other Ambulatory Visit (HOSPITAL_COMMUNITY): Payer: Self-pay

## 2022-06-18 ENCOUNTER — Inpatient Hospital Stay: Payer: No Typology Code available for payment source | Attending: Genetic Counselor

## 2022-06-18 ENCOUNTER — Encounter: Payer: Self-pay | Admitting: Hematology

## 2022-06-18 ENCOUNTER — Other Ambulatory Visit: Payer: No Typology Code available for payment source

## 2022-06-18 ENCOUNTER — Other Ambulatory Visit (HOSPITAL_COMMUNITY): Payer: Self-pay

## 2022-06-18 ENCOUNTER — Inpatient Hospital Stay (HOSPITAL_BASED_OUTPATIENT_CLINIC_OR_DEPARTMENT_OTHER): Payer: No Typology Code available for payment source | Admitting: Hematology

## 2022-06-18 ENCOUNTER — Ambulatory Visit: Payer: No Typology Code available for payment source | Admitting: Hematology

## 2022-06-18 VITALS — BP 125/88 | HR 102 | Temp 98.6°F | Resp 16 | Wt 160.0 lb

## 2022-06-18 DIAGNOSIS — C50111 Malignant neoplasm of central portion of right female breast: Secondary | ICD-10-CM

## 2022-06-18 DIAGNOSIS — Z170 Estrogen receptor positive status [ER+]: Secondary | ICD-10-CM | POA: Diagnosis not present

## 2022-06-18 DIAGNOSIS — Z79899 Other long term (current) drug therapy: Secondary | ICD-10-CM | POA: Diagnosis not present

## 2022-06-18 DIAGNOSIS — M85851 Other specified disorders of bone density and structure, right thigh: Secondary | ICD-10-CM | POA: Diagnosis not present

## 2022-06-18 LAB — CBC WITH DIFFERENTIAL (CANCER CENTER ONLY)
Abs Immature Granulocytes: 0.01 10*3/uL (ref 0.00–0.07)
Basophils Absolute: 0 10*3/uL (ref 0.0–0.1)
Basophils Relative: 1 %
Eosinophils Absolute: 0.1 10*3/uL (ref 0.0–0.5)
Eosinophils Relative: 1 %
HCT: 38.5 % (ref 36.0–46.0)
Hemoglobin: 13.5 g/dL (ref 12.0–15.0)
Immature Granulocytes: 0 %
Lymphocytes Relative: 21 %
Lymphs Abs: 1.1 10*3/uL (ref 0.7–4.0)
MCH: 29.2 pg (ref 26.0–34.0)
MCHC: 35.1 g/dL (ref 30.0–36.0)
MCV: 83.3 fL (ref 80.0–100.0)
Monocytes Absolute: 0.2 10*3/uL (ref 0.1–1.0)
Monocytes Relative: 4 %
Neutro Abs: 4 10*3/uL (ref 1.7–7.7)
Neutrophils Relative %: 73 %
Platelet Count: 277 10*3/uL (ref 150–400)
RBC: 4.62 MIL/uL (ref 3.87–5.11)
RDW: 12.2 % (ref 11.5–15.5)
WBC Count: 5.5 10*3/uL (ref 4.0–10.5)
nRBC: 0 % (ref 0.0–0.2)

## 2022-06-18 LAB — CMP (CANCER CENTER ONLY)
ALT: 44 U/L (ref 0–44)
AST: 32 U/L (ref 15–41)
Albumin: 4.4 g/dL (ref 3.5–5.0)
Alkaline Phosphatase: 58 U/L (ref 38–126)
Anion gap: 8 (ref 5–15)
BUN: 14 mg/dL (ref 6–20)
CO2: 28 mmol/L (ref 22–32)
Calcium: 9.6 mg/dL (ref 8.9–10.3)
Chloride: 102 mmol/L (ref 98–111)
Creatinine: 0.85 mg/dL (ref 0.44–1.00)
GFR, Estimated: >60 mL/min (ref >60)
Glucose, Bld: 160 mg/dL — ABNORMAL HIGH (ref 70–99)
Potassium: 3.5 mmol/L (ref 3.5–5.1)
Sodium: 138 mmol/L (ref 135–145)
Total Bilirubin: 0.8 mg/dL (ref 0.3–1.2)
Total Protein: 6.7 g/dL (ref 6.5–8.1)

## 2022-06-18 MED ORDER — LETROZOLE 2.5 MG PO TABS
2.5000 mg | ORAL_TABLET | Freq: Every day | ORAL | 1 refills | Status: DC
  Filled 2022-06-18 – 2022-08-09 (×2): qty 90, 90d supply, fill #0
  Filled 2022-11-23: qty 90, 90d supply, fill #1

## 2022-06-18 NOTE — Progress Notes (Signed)
[place]   Telephone:([phone]) [phone] Fax:(336) [phone]   Clinic Follow up Note   Patient Care Team: Rochel Brome, MD as PCP - General (Family Medicine) Truitt Merle, MD as Consulting Physician (Hematology) Stark Klein, MD as Consulting Physician (General Surgery) Kyung Rudd, MD as Consulting Physician (Radiation Oncology)  Date of Service:  06/18/2022  CHIEF COMPLAINT: f/u of right breast cancer  CURRENT THERAPY:  Letrozole, starting 12/2021  ASSESSMENT & PLAN:  Sheri Moore is a 41 y.o. post total hysterectomy female with   1. Cancer of central portion of right breast, Stage IA, p(T1c, N0), ER+/PR+/HER2-, Grade 2, RS 22 -found on screening mammogram. Biopsy 08/04/21 confirmed invasive ductal carcinoma with DCIS. -right lumpectomy on 09/10/21 under Dr. Barry Dienes showed 1.2 cm IDC, grade 3, with small foci of DCIS. -Oncotype RS of 22, low risk.   -s/p adjuvant radiation under Dr. Lisbeth Renshaw, 11/03/21 - 12/17/21.   -she began letrozole in late 12/2021. She is tolerating well with no noticeable side effects. -she is clinically doing well, no new concerns. Labs reviewed, all WNL. Physical exam was unremarkable. There is no clinical concern for recurrence. -she is scheduled for annual mammogram on 07/30/22. -f/u in 6 months   2. Bone Health  -baseline DEXA on 02/12/22 was borderline osteopenia with T-score of -0.9 at right femur neck. -I encouraged her to take vit D and calcium supplements and to do weightbearing exercise.   3. Genetic Testing -she had genetic counseling on 08/19/21. Results are negative with VUS in [place].     PLAN:  -continue letrozole -mammogram 07/30/22 -lab and f/u in 6 months   No problem-specific Assessment & Plan notes found for this encounter.   SUMMARY OF ONCOLOGIC HISTORY: Oncology History Overview Note   Cancer Staging  Cancer of central portion of right breast [facility]) Staging form: Breast, AJCC 8th Edition - Clinical stage from  08/04/2021: Stage IA (cT1b, cN0, cM0, G2, ER+, PR+, HER2-) - Unsigned - Pathologic stage from 09/10/2021: Stage IA (pT1c, pN0, cM0, G2, ER+, PR+, HER2-, Oncotype DX score: 22) - Signed by Truitt Merle, MD on 10/01/2021     Cancer of central portion of right breast ([place])  07/29/2021 Mammogram   EXAM: DIGITAL DIAGNOSTIC UNILATERAL RIGHT MAMMOGRAM WITH TOMOSYNTHESIS AND CAD; ULTRASOUND RIGHT BREAST LIMITED  IMPRESSION: Suspicious mass in the right breast at 9 o'clock measuring 1.0 cm. Targeted ultrasound performed in the right axilla demonstrating normal lymph nodes.   08/04/2021 Initial Biopsy   Diagnosis Breast, right, needle core biopsy, 9 o'clock, ribbon shaped clip - INVASIVE DUCTAL CARCINOMA - DUCTAL CARCINOMA IN SITU - SEE COMMENT Microscopic Comment Based on the biopsy, the carcinoma appears Nottingham grade 2 of 3 and measures 0.9 cm in greatest linear extent.  PROGNOSTIC INDICATORS Results: The tumor cells are EQUIVOCAL for Her2 (2+). Her2 by FISH will be performed and results reported separately. Estrogen Receptor: 100%, POSITIVE, STRONG STAINING INTENSITY Progesterone Receptor: 95%, POSITIVE, STRONG STAINING INTENSITY Proliferation Marker Ki67: 10%  FLUORESCENCE IN-SITU HYBRIDIZATION Results: GROUP 5: HER2 **NEGATIVE**   09/01/2021 Initial Diagnosis   Cancer of central portion of right breast ([place])   09/08/2021 Genetic Testing   Negative genetic testing on the BRCAPlus panel.  The report date is 08/29/2021.  Negative genetic testing on the CancerNext-Expanded+RNAinsight.  DICER1 p.T43M VUS was identified. The report date is 09/08/2021.  The BRCAPlus gene panel offered by [facility] and includes sequencing and rearrangement analysis for the following 6 genes: BRCA1, BRCA2, CDH1, PALB2, PTEN, and TP53.  The CancerNext-Expanded gene panel offered by [facility] and includes sequencing and rearrangement analysis for the following 77 genes: AIP, ALK, APC*, ATM*, AXIN2,  BAP1, BARD1, BLM, BMPR1A, BRCA1*, BRCA2*, BRIP1*, CDC73, CDH1*, CDK4, CDKN1B, CDKN2A, CHEK2*, CTNNA1, DICER1, FANCC, FH, FLCN, GALNT12, KIF1B, LZTR1, MAX, MEN1, MET, MLH1*, MSH2*, MSH3, MSH6*, MUTYH*, NBN, NF1*, NF2, NTHL1, PALB2*, PHOX2B, PMS2*, POT1, PRKAR1A, PTCH1, PTEN*, RAD51C*, RAD51D*, RB1, RECQL, RET, SDHA, SDHAF2, SDHB, SDHC, SDHD, SMAD4, SMARCA4, SMARCB1, SMARCE1, STK11, SUFU, TMEM127, TP53*, TSC1, TSC2, VHL and XRCC2 (sequencing and deletion/duplication); EGFR, EGLN1, HOXB13, KIT, MITF, PDGFRA, POLD1, and POLE (sequencing only); EPCAM and GREM1 (deletion/duplication only). DNA and RNA analyses performed for * genes.    09/10/2021 Cancer Staging   Staging form: Breast, AJCC 8th Edition - Pathologic stage from 09/10/2021: Stage IA (pT1c, pN0, cM0, G2, ER+, PR+, HER2-, Oncotype DX score: 22) - Signed by Truitt Merle, MD on 10/01/2021 Stage prefix: Initial diagnosis Multigene prognostic tests performed: Oncotype DX Recurrence score range: Greater than or equal to 11 Histologic grading system: 3 grade system Residual tumor (R): R0 - None   09/10/2021 Definitive Surgery   FINAL MICROSCOPIC DIAGNOSIS:  A. BREAST, RIGHT, LUMPECTOMY:  Invasive ductal carcinoma with clear margins of resection.  INVASIVE CARCINOMA OF THE BREAST:  Resection  Procedure: Lumpectomy.  Specimen Laterality: Right.  Histologic Type: Invasive ductal carcinoma.  Histologic Grade:       Glandular (Acinar)/Tubular Differentiation: 3 out of possible 3.       Nuclear Pleomorphism: 2 out of possible 3.       Mitotic Rate: 1 out of possible 3.       Overall Grade: 2 out of possible 3 (from a score of 6 out of  possible 9)  Tumor Size: 1.2 cm. x 1.0 cm. x 0.8 cm.  Ductal Carcinoma In Situ: Present. Small foci without comedo necrosis.  Tumor Extent: Limited to breast parenchyma.  Treatment Effect in the Breast: No known presurgical therapy  Margins: All margins are negative for invasive carcinoma.       Distance from Closest  Margin (mm): 7 mm.       Specify Closest Margin (required only if <54m): Inferior.  DCIS Margins: Uninvolved by DCIS.       Distance from Closest Margin (mm): 6 mm.       Specify Closest Margin (required only if <159m: Posterior.  Regional Lymph Nodes:       Number of Lymph Nodes Examined: Five (5).       Number of Sentinel Nodes Examined: Five (5).       Number of Lymph Nodes with Macrometastases (>2 mm): None (0).       Number of Lymph Nodes with Micrometastases: None (0).       Number of Lymph Nodes with Isolated Tumor Cells (=0.2 mm or =200 cells): None (0).       Size of Largest Metastatic Deposit (mm): N.A.       Extranodal Extension: N.A.  Distant Metastasis:       Distant Site(s) Involved: None known.  Breast Biomarker Testing Performed on Previous Biopsy:       Testing Performed on Case Number: [id number] from 08/04/2021.             Estrogen Receptor: 100% (Strong intensity).             Progesterone Receptor: 95% (Strong intensity).             HER2: 2+ by IHC / Negative by FISH.  Ki-67: 10%  Pathologic Stage Classification (pTNM, AJCC 8th Edition): pT1c, pN0  Representative Tumor Block: A3 and A2.  Comment(s): None.   B. LYMPH NODE, RIGHT (5), SENTINEL, EXCISION:  5 lymph nodes with fatty infiltration which is negative for metastatic  Carcinoma. (0/5)    11/03/2021 - 12/17/2021 Radiation Therapy   Site Technique Total Dose (Gy) Dose per Fx (Gy) Completed Fx Beam Energies  Breast, Right: Breast_R 3D 50.4/50.4 1.8 28/28 6X  Breast, Right: Breast_R_Bst 3D 10/10 2 5/5 6X     12/2021 -  Anti-estrogen oral therapy   Letrozole x 7-10 years      INTERVAL HISTORY:  Sheri Moore is here for a follow up of breast cancer. She was last seen by me on 12/11/21 with survivorship in the interim. She presents to the clinic alone. She reports she is doing well overall. She denies side effects from letrozole, except maybe decreased sex drive.   All other systems were  reviewed with the patient and are negative.  MEDICAL HISTORY:  Past Medical History:  Diagnosis Date   Anxiety    Breast cancer ([place]) 07/06/21   Right Breast, first seen on mammogram done 07/06/21   Depression    Family history of breast cancer    Family history of melanoma    Family history of uterine cancer    Fibroid    Gastroschisis     SURGICAL HISTORY: Past Surgical History:  Procedure Laterality Date   ABDOMINAL HYSTERECTOMY     supracervical hysterectomy   ABDOMINAL SURGERY     as a newborn, gastrogheschesis   BREAST LUMPECTOMY WITH RADIOACTIVE SEED AND SENTINEL LYMPH NODE BIOPSY Right 09/10/2021   Procedure: RIGHT BREAST LUMPECTOMY WITH RADIOACTIVE SEED AND SENTINEL LYMPH NODE BIOPSY;  Surgeon: Stark Klein, MD;  Location: [place];  Service: General;  Laterality: Right;   OOPHORECTOMY      I have reviewed the social history and family history with the patient and they are unchanged from previous note.  ALLERGIES:  has No Known Allergies.  MEDICATIONS:  Current Outpatient Medications  Medication Sig Dispense Refill   amphetamine-dextroamphetamine (ADDERALL XR) 25 MG 24 hr capsule TAKE 1 CAPSULE BY MOUTH EVERY MORNING. 30 capsule 0   escitalopram (LEXAPRO) 10 MG tablet Take 1 tablet (10 mg total) by mouth daily. 90 tablet 0   fluticasone (FLONASE) 50 MCG/ACT nasal spray Place 2 sprays into both nostrils daily.     letrozole (FEMARA) 2.5 MG tablet Take 1 tablet (2.5 mg total) by mouth daily. 90 tablet 1   loratadine (CLARITIN) 10 MG tablet Take 10 mg by mouth daily.     LORazepam (ATIVAN) 0.5 MG tablet Take 1 tablet (0.5 mg total) by mouth every 8 (eight) hours. (Patient taking differently: Take 0.5 mg by mouth every 8 (eight) hours as needed for anxiety.) 30 tablet 1   Semaglutide-Weight Management (WEGOVY) 1.7 MG/0.75ML SOAJ Inject 1.7 mg into the skin once a week. 3 mL 0   No current facility-administered medications for this visit.    PHYSICAL EXAMINATION: ECOG  PERFORMANCE STATUS: 0 - Asymptomatic  Vitals:   06/18/22 1455  BP: 125/88  Pulse: (!) 102  Resp: 16  Temp: 98.6 F (37 C)  SpO2: 98%   Wt Readings from Last 3 Encounters:  06/18/22 160 lb (72.6 kg)  06/02/22 160 lb 1.6 oz (72.6 kg)  05/17/22 155 lb (70.3 kg)     GENERAL:alert, no distress and comfortable SKIN: skin color, texture, turgor are normal, no  rashes or significant lesions EYES: normal, Conjunctiva are pink and non-injected, sclera clear  NECK: supple, thyroid normal size, non-tender, without nodularity LYMPH:  no palpable lymphadenopathy in the cervical, axillary LUNGS: clear to auscultation and percussion with normal breathing effort HEART: regular rate & rhythm and no murmurs and no lower extremity edema ABDOMEN:abdomen soft, non-tender and normal bowel sounds Musculoskeletal:no cyanosis of digits and no clubbing  NEURO: alert & oriented x 3 with fluent speech, no focal motor/sensory deficits BREAST: No palpable mass, nodules or adenopathy bilaterally. Breast exam benign.   LABORATORY DATA:  I have reviewed the data as listed    Latest Ref Rng & Units 06/18/2022    2:29 PM 06/02/2022    3:06 PM 02/22/2022   11:27 AM  CBC  WBC 4.0 - 10.5 K/uL 5.5  4.7  6.2   Hemoglobin 12.0 - 15.0 g/dL 13.5  13.7  14.7   Hematocrit 36.0 - 46.0 % 38.5  41.3  44.0   Platelets 150 - 400 K/uL 277  262  301         Latest Ref Rng & Units 06/18/2022    2:29 PM 06/02/2022    3:06 PM 02/22/2022   11:27 AM  CMP  Glucose 70 - 99 mg/dL 160  102  103   BUN 6 - 20 mg/dL '14  14  13   ' Creatinine 0.44 - 1.00 mg/dL 0.85  0.76  0.64   Sodium 135 - 145 mmol/L 138  142  144   Potassium 3.5 - 5.1 mmol/L 3.5  3.9  4.8   Chloride 98 - 111 mmol/L 102  102  103   CO2 22 - 32 mmol/L '28  20  24   ' Calcium 8.9 - 10.3 mg/dL 9.6  9.4  9.6   Total Protein 6.5 - 8.1 g/dL 6.7  6.5  6.8   Total Bilirubin 0.3 - 1.2 mg/dL 0.8  0.4  0.3   Alkaline Phos 38 - 126 U/L 58  91  124   AST 15 - 41 U/L 32  73  25    ALT 0 - 44 U/L 44  106  28       RADIOGRAPHIC STUDIES: I have personally reviewed the radiological images as listed and agreed with the findings in the report. No results found.    No orders of the defined types were placed in this encounter.  All questions were answered. The patient knows to call the clinic with any problems, questions or concerns. No barriers to learning was detected. The total time spent in the appointment was 25 minutes.     Truitt Merle, MD 06/18/2022   I, Wilburn Mylar, am acting as scribe for Truitt Merle, MD.   I have reviewed the above documentation for accuracy and completeness, and I agree with the above.

## 2022-06-30 ENCOUNTER — Other Ambulatory Visit: Payer: Self-pay | Admitting: Family Medicine

## 2022-06-30 ENCOUNTER — Other Ambulatory Visit: Payer: Self-pay

## 2022-06-30 ENCOUNTER — Other Ambulatory Visit (HOSPITAL_COMMUNITY): Payer: Self-pay

## 2022-06-30 DIAGNOSIS — Z6826 Body mass index (BMI) 26.0-26.9, adult: Secondary | ICD-10-CM

## 2022-06-30 MED ORDER — SEMAGLUTIDE-WEIGHT MANAGEMENT 2.4 MG/0.75ML ~~LOC~~ SOAJ
2.4000 mg | SUBCUTANEOUS | 0 refills | Status: DC
  Filled 2022-06-30: qty 3, 28d supply, fill #0

## 2022-07-14 ENCOUNTER — Ambulatory Visit (INDEPENDENT_AMBULATORY_CARE_PROVIDER_SITE_OTHER): Payer: No Typology Code available for payment source

## 2022-07-14 DIAGNOSIS — Z23 Encounter for immunization: Secondary | ICD-10-CM | POA: Diagnosis not present

## 2022-07-16 ENCOUNTER — Ambulatory Visit (INDEPENDENT_AMBULATORY_CARE_PROVIDER_SITE_OTHER): Payer: No Typology Code available for payment source | Admitting: Family Medicine

## 2022-07-16 ENCOUNTER — Encounter: Payer: Self-pay | Admitting: Family Medicine

## 2022-07-16 VITALS — BP 100/78 | HR 99 | Temp 97.8°F | Resp 15 | Ht 65.0 in | Wt 156.0 lb

## 2022-07-16 DIAGNOSIS — N3001 Acute cystitis with hematuria: Secondary | ICD-10-CM | POA: Diagnosis not present

## 2022-07-16 DIAGNOSIS — R310 Gross hematuria: Secondary | ICD-10-CM

## 2022-07-16 LAB — POCT URINALYSIS DIP (CLINITEK)
Glucose, UA: NEGATIVE mg/dL
Nitrite, UA: NEGATIVE
POC PROTEIN,UA: 100 — AB
Spec Grav, UA: >=1.03 — AB (ref 1.010–1.025)
Urobilinogen, UA: 1 E.U./dL
pH, UA: 5 (ref 5.0–8.0)

## 2022-07-16 MED ORDER — CEFTRIAXONE SODIUM 1 G IJ SOLR
1.0000 g | Freq: Once | INTRAMUSCULAR | Status: AC
  Administered 2022-07-16: 1 g via INTRAMUSCULAR

## 2022-07-16 MED ORDER — NITROFURANTOIN MONOHYD MACRO 100 MG PO CAPS: ORAL_CAPSULE | ORAL | 0 refills | Status: AC

## 2022-07-16 NOTE — Progress Notes (Signed)
Acute Office Visit  Subjective:    Patient ID: Sheri Moore, female    DOB: 1981-01-07, 41 y.o.   MRN: [id number]  Chief Complaint  Patient presents with   Urinary Tract Infection    HPI: Patient is in today for frequency urination, dysuria and hematuria since one day ago. She noticed a blood clot on the urine. She denies fever, chills, abdominal pain, back pain, nauseas. She has not taking any medication for this problems.  Past Medical History:  Diagnosis Date   Anxiety    Breast cancer ([place]) 07/06/21   Right Breast, first seen on mammogram done 07/06/21   Depression    Family history of breast cancer    Family history of melanoma    Family history of uterine cancer    Fibroid    Gastroschisis     Past Surgical History:  Procedure Laterality Date   ABDOMINAL HYSTERECTOMY     supracervical hysterectomy   ABDOMINAL SURGERY     as a newborn, gastrogheschesis   BREAST LUMPECTOMY WITH RADIOACTIVE SEED AND SENTINEL LYMPH NODE BIOPSY Right 09/10/2021   Procedure: RIGHT BREAST LUMPECTOMY WITH RADIOACTIVE SEED AND SENTINEL LYMPH NODE BIOPSY;  Surgeon: Stark Klein, MD;  Location: [place];  Service: General;  Laterality: Right;   OOPHORECTOMY      Family History  Problem Relation Age of Onset   Uterine cancer Mother 49   Diabetes Mother    Hypertension Mother    Thyroid disease Mother    Obesity Mother    Subarachnoid hemorrhage Father    Melanoma Father 37   Diabetes Father    Diabetes Sister    Obesity Sister    Diabetes Maternal Grandmother    Hypertension Maternal Grandmother    Melanoma Paternal Grandfather 35   Diabetes Paternal Grandfather    Breast cancer Other 51       MGMs sister   Breast cancer Other 52       MGF's sister   Breast cancer Other        dx < 61; PGMs sister    Social History   Socioeconomic History   Marital status: Married    Spouse name: Not on file   Number of children: 1   Years of education: College   Highest education  level: Not on file  Occupational History   Occupation: [employer]  Tobacco Use   Smoking status: Former    Packs/day: 0.25    Years: 3.00    Total pack years: 0.75    Types: Cigarettes    Quit date: 10/11/2001    Years since quitting: 20.7   Smokeless tobacco: Former  Scientific laboratory technician Use: Never used  Substance and Sexual Activity   Alcohol use: Not Currently    Comment: occ   Drug use: No   Sexual activity: Yes    Partners: Female    Birth control/protection: Surgical    Comment: supracervical hysterectomy  Other Topics Concern   Not on file  Social History Narrative   Lives with husband and son   Caffeine use: rarely   Right handed    Social Determinants of Health   Financial Resource Strain: Not on file  Food Insecurity: Not on file  Transportation Needs: Not on file  Physical Activity: Not on file  Stress: Not on file  Social Connections: Not on file  Intimate Partner Violence: Not At Risk (09/08/2021)   Humiliation, Afraid, Rape, and Kick questionnaire  Fear of Current or Ex-Partner: No    Emotionally Abused: No    Physically Abused: No    Sexually Abused: No    Outpatient Medications Prior to Visit  Medication Sig Dispense Refill   amphetamine-dextroamphetamine (ADDERALL XR) 25 MG 24 hr capsule TAKE 1 CAPSULE BY MOUTH EVERY MORNING. 30 capsule 0   escitalopram (LEXAPRO) 10 MG tablet Take 1 tablet (10 mg total) by mouth daily. 90 tablet 0   fluticasone (FLONASE) 50 MCG/ACT nasal spray Place 2 sprays into both nostrils daily.     letrozole (FEMARA) 2.5 MG tablet Take 1 tablet (2.5 mg total) by mouth daily. 90 tablet 1   loratadine (CLARITIN) 10 MG tablet Take 10 mg by mouth daily.     LORazepam (ATIVAN) 0.5 MG tablet Take 1 tablet (0.5 mg total) by mouth every 8 (eight) hours. (Patient taking differently: Take 0.5 mg by mouth every 8 (eight) hours as needed for anxiety.) 30 tablet 1   Semaglutide-Weight Management 2.4 MG/0.75ML SOAJ Inject 2.4 mg  into the skin once a week. 3 mL 0   No facility-administered medications prior to visit.    No Known Allergies  Review of Systems  Constitutional:  Negative for chills, fatigue and fever.  HENT:  Negative for congestion, ear pain and sore throat.   Respiratory:  Negative for cough and shortness of breath.   Cardiovascular:  Negative for chest pain and palpitations.  Gastrointestinal:  Negative for abdominal pain, constipation, diarrhea, nausea and vomiting.  Endocrine: Negative for polydipsia, polyphagia and polyuria.  Genitourinary:  Positive for dysuria, frequency and hematuria. Negative for difficulty urinating.  Musculoskeletal:  Negative for arthralgias, back pain and myalgias.  Skin:  Negative for rash.  Neurological:  Negative for headaches.  Psychiatric/Behavioral:  Negative for dysphoric mood. The patient is not nervous/anxious.        Objective:    Physical Exam Vitals reviewed.  Constitutional:      Appearance: Normal appearance. She is normal weight.  Neck:     Vascular: No carotid bruit.  Cardiovascular:     Rate and Rhythm: Normal rate and regular rhythm.     Heart sounds: Normal heart sounds.  Pulmonary:     Effort: Pulmonary effort is normal. No respiratory distress.     Breath sounds: Normal breath sounds.  Abdominal:     General: Abdomen is flat. Bowel sounds are normal.     Palpations: Abdomen is soft.     Tenderness: There is no abdominal tenderness. There is no left CVA tenderness or rebound.  Neurological:     Mental Status: She is alert and oriented to person, place, and time.  Psychiatric:        Mood and Affect: Mood normal.        Behavior: Behavior normal.     BP 100/78   Pulse 99   Temp 97.8 F (36.6 C)   Resp 15   Ht _0  (1.651 m)   Wt 156 lb (70.8 kg)   SpO2 99%   BMI 25.96 kg/m  Wt Readings from Last 3 Encounters:  07/16/22 156 lb (70.8 kg)  06/18/22 160 lb (72.6 kg)  06/02/22 160 lb 1.6 oz (72.6 kg)    Health  Maintenance Due  Topic Date Due   PAP SMEAR-Modifier  11/18/2020   COVID-19 Vaccine (3 - Moderna risk series) 03/09/2021    There are no preventive care reminders to display for this patient.   Lab Results  Component Value Date  TSH 1.510 06/02/2022   Lab Results  Component Value Date   WBC 5.5 06/18/2022   HGB 13.5 06/18/2022   HCT 38.5 06/18/2022   MCV 83.3 06/18/2022   PLT 277 06/18/2022   Lab Results  Component Value Date   NA 138 06/18/2022   K 3.5 06/18/2022   CO2 28 06/18/2022   GLUCOSE 160 (H) 06/18/2022   BUN 14 06/18/2022   CREATININE 0.85 06/18/2022   BILITOT 0.8 06/18/2022   ALKPHOS 58 06/18/2022   AST 32 06/18/2022   ALT 44 06/18/2022   PROT 6.7 06/18/2022   ALBUMIN 4.4 06/18/2022   CALCIUM 9.6 06/18/2022   ANIONGAP 8 06/18/2022   EGFR 100 06/02/2022   Lab Results  Component Value Date   CHOL 217 (H) 06/02/2022   Lab Results  Component Value Date   HDL 51 06/02/2022   Lab Results  Component Value Date   LDLCALC 139 (H) 06/02/2022   Lab Results  Component Value Date   TRIG 151 (H) 06/02/2022   Lab Results  Component Value Date   CHOLHDL 4.3 06/02/2022   No results found for: "HGBA1C"     Assessment & Plan:   Problem List Items Addressed This Visit       Genitourinary   Gross hematuria - Primary    Check UA.      Relevant Orders   POCT URINALYSIS DIP (CLINITEK) (Completed)   Acute cystitis with hematuria    Check UA. Ordered Urine culture. Rocephin 1 gm given.  Take 1 capsule (100 mg total) by mouth 2 (two) times daily for 7 days, THEN 1 capsule (100 mg total) 2 (two) times daily for 23 days      Relevant Medications   nitrofurantoin, macrocrystal-monohydrate, (MACROBID) 100 MG capsule   Other Relevant Orders   POCT URINALYSIS DIP (CLINITEK) (Completed)   Urine Culture   Meds ordered this encounter  Medications   nitrofurantoin, macrocrystal-monohydrate, (MACROBID) 100 MG capsule    Sig: Take 1 capsule (100 mg  total) by mouth 2 (two) times daily for 7 days, THEN 1 capsule (100 mg total) 2 (two) times daily for 23 days.    Dispense:  37 capsule    Refill:  0   cefTRIAXone (ROCEPHIN) injection 1 g    Orders Placed This Encounter  Procedures   Urine Culture   POCT URINALYSIS DIP (CLINITEK)     Follow-up: No follow-ups on file.  I,Naomi Castrogiovanni,acting as a Education administrator for Rochel Brome, MD.,have documented all relevant documentation on the behalf of Rochel Brome, MD,as directed by  Rochel Brome, MD while in the presence of Rochel Brome, MD.   An After Visit Summary was printed and given to the patient.  Rochel Brome, MD [facility] [phone]

## 2022-07-18 DIAGNOSIS — N3000 Acute cystitis without hematuria: Secondary | ICD-10-CM | POA: Insufficient documentation

## 2022-07-18 DIAGNOSIS — N3001 Acute cystitis with hematuria: Secondary | ICD-10-CM | POA: Insufficient documentation

## 2022-07-18 DIAGNOSIS — R310 Gross hematuria: Secondary | ICD-10-CM | POA: Insufficient documentation

## 2022-07-18 NOTE — Assessment & Plan Note (Signed)
Check UA 

## 2022-07-18 NOTE — Assessment & Plan Note (Addendum)
Check UA. Ordered Urine culture. Rocephin 1 gm given.  Take 1 capsule (100 mg total) by mouth 2 (two) times daily for 7 days, THEN 1 capsule (100 mg total) 2 (two) times daily for 23 days

## 2022-07-19 LAB — URINE CULTURE

## 2022-07-29 ENCOUNTER — Other Ambulatory Visit (HOSPITAL_COMMUNITY): Payer: Self-pay

## 2022-07-29 ENCOUNTER — Other Ambulatory Visit: Payer: Self-pay

## 2022-07-29 MED ORDER — SEMAGLUTIDE-WEIGHT MANAGEMENT 2.4 MG/0.75ML ~~LOC~~ SOAJ
2.4000 mg | SUBCUTANEOUS | 0 refills | Status: DC
  Filled 2022-07-29: qty 3, 28d supply, fill #0

## 2022-07-30 ENCOUNTER — Ambulatory Visit
Admission: RE | Admit: 2022-07-30 | Discharge: 2022-07-30 | Disposition: A | Discharge status: Home / Self Care | Payer: No Typology Code available for payment source | Source: Ambulatory Visit | Attending: Adult Health | Admitting: Adult Health

## 2022-07-30 DIAGNOSIS — C50111 Malignant neoplasm of central portion of right female breast: Secondary | ICD-10-CM

## 2022-08-09 ENCOUNTER — Other Ambulatory Visit (HOSPITAL_COMMUNITY): Payer: Self-pay

## 2022-08-09 ENCOUNTER — Other Ambulatory Visit: Payer: Self-pay | Admitting: Nurse Practitioner

## 2022-08-09 ENCOUNTER — Telehealth: Payer: Self-pay | Admitting: Nurse Practitioner

## 2022-08-09 DIAGNOSIS — F900 Attention-deficit hyperactivity disorder, predominantly inattentive type: Secondary | ICD-10-CM

## 2022-08-09 MED ORDER — AMPHETAMINE-DEXTROAMPHET ER 25 MG PO CP24
25.0000 mg | ORAL_CAPSULE | Freq: Every morning | ORAL | 0 refills | Status: DC
  Filled 2022-08-09: qty 30, 30d supply, fill #0

## 2022-08-09 NOTE — Telephone Encounter (Signed)
error 

## 2022-08-10 ENCOUNTER — Other Ambulatory Visit (HOSPITAL_COMMUNITY): Payer: Self-pay

## 2022-08-25 ENCOUNTER — Other Ambulatory Visit (HOSPITAL_COMMUNITY): Payer: Self-pay

## 2022-08-25 ENCOUNTER — Other Ambulatory Visit: Payer: Self-pay

## 2022-08-25 MED ORDER — SEMAGLUTIDE-WEIGHT MANAGEMENT 2.4 MG/0.75ML ~~LOC~~ SOAJ
2.4000 mg | SUBCUTANEOUS | 2 refills | Status: DC
  Filled 2022-08-25: qty 3, 28d supply, fill #0
  Filled 2022-09-22: qty 3, 28d supply, fill #1
  Filled 2022-10-20: qty 3, 28d supply, fill #2

## 2022-08-26 ENCOUNTER — Encounter: Payer: Self-pay | Admitting: Nurse Practitioner

## 2022-08-26 ENCOUNTER — Ambulatory Visit (INDEPENDENT_AMBULATORY_CARE_PROVIDER_SITE_OTHER): Payer: No Typology Code available for payment source | Admitting: Nurse Practitioner

## 2022-08-26 VITALS — BP 122/68 | HR 78 | Temp 97.2°F | Ht 66.0 in | Wt 151.0 lb

## 2022-08-26 DIAGNOSIS — N3001 Acute cystitis with hematuria: Secondary | ICD-10-CM

## 2022-08-26 LAB — POCT URINALYSIS DIP (CLINITEK)
Glucose, UA: 250 mg/dL — AB
Nitrite, UA: POSITIVE — AB
POC PROTEIN,UA: >=300 — AB
Spec Grav, UA: >=1.03 — AB (ref 1.010–1.025)
Urobilinogen, UA: >=8 E.U./dL — AB
pH, UA: 5 (ref 5.0–8.0)

## 2022-08-26 MED ORDER — NITROFURANTOIN MONOHYD MACRO 100 MG PO CAPS: 100.0000 mg | ORAL_CAPSULE | Freq: Two times a day (BID) | ORAL | 0 refills | Status: DC

## 2022-08-26 NOTE — Progress Notes (Signed)
Acute Office Visit  Subjective:    Patient ID: Sheri Moore, female    DOB: 1981/08/02, 41 y.o.   MRN: [id number]  Chief Complaint  Patient presents with   Urinary Tract Infection    HPI: Patient is in today for Urinary symptoms  She reports new onset dysuria, urinary frequency, and urinary urgency. Denies fever, chills, or body aches.The current episode started last night and is staying constant. Patient states symptoms are severe in intensity, occurring constantly. She  has not been recently treated for similar symptoms. Treatment includes azo and some left over macrobid last night and today.      Past Medical History:  Diagnosis Date   Anxiety    Breast cancer ([place]) 07/06/21   Right Breast, first seen on mammogram done 07/06/21   Depression    Family history of breast cancer    Family history of melanoma    Family history of uterine cancer    Fibroid    Gastroschisis     Past Surgical History:  Procedure Laterality Date   ABDOMINAL HYSTERECTOMY     supracervical hysterectomy   ABDOMINAL SURGERY     as a newborn, gastrogheschesis   BREAST LUMPECTOMY Right 09/10/2021   BREAST LUMPECTOMY WITH RADIOACTIVE SEED AND SENTINEL LYMPH NODE BIOPSY Right 09/10/2021   Procedure: RIGHT BREAST LUMPECTOMY WITH RADIOACTIVE SEED AND SENTINEL LYMPH NODE BIOPSY;  Surgeon: Stark Klein, MD;  Location: [facility];  Service: General;  Laterality: Right;   OOPHORECTOMY      Family History  Problem Relation Age of Onset   Uterine cancer Mother 46   Diabetes Mother    Hypertension Mother    Thyroid disease Mother    Obesity Mother    Subarachnoid hemorrhage Father    Melanoma Father 56   Diabetes Father    Diabetes Sister    Obesity Sister    Diabetes Maternal Grandmother    Hypertension Maternal Grandmother    Melanoma Paternal Grandfather 5   Diabetes Paternal Grandfather    Breast cancer Other 42       MGMs sister   Breast cancer Other 51       MGF's sister   Breast  cancer Other        dx < 65; PGMs sister    Social History   Socioeconomic History   Marital status: Married    Spouse name: Not on file   Number of children: 1   Years of education: College   Highest education level: Not on file  Occupational History   Occupation: [employer]  Tobacco Use   Smoking status: Former    Packs/day: 0.25    Years: 3.00    Total pack years: 0.75    Types: Cigarettes    Quit date: 10/11/2001    Years since quitting: 20.8   Smokeless tobacco: Former  Scientific laboratory technician Use: Never used  Substance and Sexual Activity   Alcohol use: Not Currently    Comment: occ   Drug use: No   Sexual activity: Yes    Partners: Female    Birth control/protection: Surgical    Comment: supracervical hysterectomy  Other Topics Concern   Not on file  Social History Narrative   Lives with husband and son   Caffeine use: rarely   Right handed    Social Determinants of Health   Financial Resource Strain: Not on file  Food Insecurity: Not on file  Transportation Needs: Not on file  Physical Activity: Not on file  Stress: Not on file  Social Connections: Not on file  Intimate Partner Violence: Not At Risk (09/08/2021)   Humiliation, Afraid, Rape, and Kick questionnaire    Fear of Current or Ex-Partner: No    Emotionally Abused: No    Physically Abused: No    Sexually Abused: No    Outpatient Medications Prior to Visit  Medication Sig Dispense Refill   amphetamine-dextroamphetamine (ADDERALL XR) 25 MG 24 hr capsule Take 1 capsule by mouth every morning. 30 capsule 0   escitalopram (LEXAPRO) 10 MG tablet Take 1 tablet (10 mg total) by mouth daily. 90 tablet 0   fluticasone (FLONASE) 50 MCG/ACT nasal spray Place 2 sprays into both nostrils daily.     letrozole (FEMARA) 2.5 MG tablet Take 1 tablet (2.5 mg total) by mouth daily. 90 tablet 1   loratadine (CLARITIN) 10 MG tablet Take 10 mg by mouth daily.     LORazepam (ATIVAN) 0.5 MG tablet Take 1 tablet  (0.5 mg total) by mouth every 8 (eight) hours. (Patient taking differently: Take 0.5 mg by mouth every 8 (eight) hours as needed for anxiety.) 30 tablet 1   Semaglutide-Weight Management 2.4 MG/0.75ML SOAJ Inject 2.4 mg into the skin once a week. 3 mL 2   No facility-administered medications prior to visit.    No Known Allergies  Review of Systems See pertinent positives and negatives per HPI.     Objective:    Physical Exam Vitals reviewed.  Constitutional:      Appearance: Normal appearance.  Abdominal:     Tenderness: There is no right CVA tenderness or left CVA tenderness.  Skin:    General: Skin is warm and dry.     Capillary Refill: Capillary refill takes less than 2 seconds.  Neurological:     Mental Status: She is alert and oriented to person, place, and time.  Psychiatric:        Mood and Affect: Mood normal.        Behavior: Behavior normal.     BP 122/68   Pulse 78   Temp (!) 97.2 F (36.2 C)   Ht 5' 6" (1.676 m)   Wt 151 lb (68.5 kg)   SpO2 98%   BMI 24.37 kg/m  Wt Readings from Last 3 Encounters:  08/26/22 151 lb (68.5 kg)  07/16/22 156 lb (70.8 kg)  06/18/22 160 lb (72.6 kg)    Health Maintenance Due  Topic Date Due   PAP SMEAR-Modifier  11/18/2020   COVID-19 Vaccine (3 - Moderna risk series) 03/09/2021    There are no preventive care reminders to display for this patient.   Lab Results  Component Value Date   TSH 1.510 06/02/2022   Lab Results  Component Value Date   WBC 5.5 06/18/2022   HGB 13.5 06/18/2022   HCT 38.5 06/18/2022   MCV 83.3 06/18/2022   PLT 277 06/18/2022   Lab Results  Component Value Date   NA 138 06/18/2022   K 3.5 06/18/2022   CO2 28 06/18/2022   GLUCOSE 160 (H) 06/18/2022   BUN 14 06/18/2022   CREATININE 0.85 06/18/2022   BILITOT 0.8 06/18/2022   ALKPHOS 58 06/18/2022   AST 32 06/18/2022   ALT 44 06/18/2022   PROT 6.7 06/18/2022   ALBUMIN 4.4 06/18/2022   CALCIUM 9.6 06/18/2022   ANIONGAP 8 06/18/2022    EGFR 100 06/02/2022   Lab Results  Component Value Date   CHOL 217 (H)  06/02/2022   Lab Results  Component Value Date   HDL 51 06/02/2022   Lab Results  Component Value Date   LDLCALC 139 (H) 06/02/2022   Lab Results  Component Value Date   TRIG 151 (H) 06/02/2022   Lab Results  Component Value Date   CHOLHDL 4.3 06/02/2022        Assessment & Plan:    1. Acute cystitis with hematuria - POCT URINALYSIS DIP (CLINITEK) - Urine Culture - nitrofurantoin, macrocrystal-monohydrate, (MACROBID) 100 MG capsule; Take 1 capsule (100 mg total) by mouth 2 (two) times daily.  Dispense: 14 capsule; Refill: 0   Push fluids Avoid holding urine Follow-up if symptoms worsen or fail to improve     Follow-up: PRN  An After Visit Summary was printed and given to the patient.  I, Rip Harbour, NP, have reviewed all documentation for this visit. The documentation on 08/26/22 for the exam, diagnosis, procedures, and orders are all accurate and complete.    Signed, Rip Harbour, NP [place] [phone]

## 2022-08-28 LAB — URINE CULTURE

## 2022-09-13 ENCOUNTER — Ambulatory Visit (INDEPENDENT_AMBULATORY_CARE_PROVIDER_SITE_OTHER): Payer: No Typology Code available for payment source

## 2022-09-13 DIAGNOSIS — Z23 Encounter for immunization: Secondary | ICD-10-CM | POA: Diagnosis not present

## 2022-09-22 ENCOUNTER — Other Ambulatory Visit: Payer: Self-pay | Admitting: Family Medicine

## 2022-09-22 ENCOUNTER — Other Ambulatory Visit: Payer: Self-pay

## 2022-09-22 ENCOUNTER — Other Ambulatory Visit: Payer: Self-pay | Admitting: Nurse Practitioner

## 2022-09-22 ENCOUNTER — Other Ambulatory Visit (HOSPITAL_COMMUNITY): Payer: Self-pay

## 2022-09-22 DIAGNOSIS — F900 Attention-deficit hyperactivity disorder, predominantly inattentive type: Secondary | ICD-10-CM

## 2022-09-22 DIAGNOSIS — N6323 Unspecified lump in the left breast, lower outer quadrant: Secondary | ICD-10-CM

## 2022-09-22 DIAGNOSIS — F418 Other specified anxiety disorders: Secondary | ICD-10-CM

## 2022-09-22 MED ORDER — ESCITALOPRAM OXALATE 10 MG PO TABS
10.0000 mg | ORAL_TABLET | Freq: Every day | ORAL | 0 refills | Status: DC
  Filled 2022-09-22: qty 90, 90d supply, fill #0

## 2022-09-22 MED ORDER — AMPHETAMINE-DEXTROAMPHET ER 25 MG PO CP24
25.0000 mg | ORAL_CAPSULE | Freq: Every morning | ORAL | 0 refills | Status: DC
  Filled 2022-09-22 – 2022-09-24 (×4): qty 30, 30d supply, fill #0

## 2022-09-23 ENCOUNTER — Other Ambulatory Visit: Payer: Self-pay

## 2022-09-23 ENCOUNTER — Other Ambulatory Visit (HOSPITAL_COMMUNITY): Payer: Self-pay

## 2022-09-23 DIAGNOSIS — N6323 Unspecified lump in the left breast, lower outer quadrant: Secondary | ICD-10-CM

## 2022-09-24 ENCOUNTER — Other Ambulatory Visit (HOSPITAL_COMMUNITY): Payer: Self-pay

## 2022-09-24 ENCOUNTER — Other Ambulatory Visit: Payer: Self-pay

## 2022-09-27 ENCOUNTER — Other Ambulatory Visit: Payer: Self-pay

## 2022-09-27 ENCOUNTER — Other Ambulatory Visit (HOSPITAL_COMMUNITY): Payer: Self-pay

## 2022-09-28 ENCOUNTER — Other Ambulatory Visit (HOSPITAL_COMMUNITY): Payer: Self-pay

## 2022-09-28 ENCOUNTER — Other Ambulatory Visit: Payer: Self-pay

## 2022-09-30 ENCOUNTER — Ambulatory Visit
Admission: RE | Admit: 2022-09-30 | Discharge: 2022-09-30 | Disposition: A | Discharge status: Home / Self Care | Payer: No Typology Code available for payment source | Source: Ambulatory Visit | Attending: Family Medicine | Admitting: Family Medicine

## 2022-09-30 DIAGNOSIS — N6323 Unspecified lump in the left breast, lower outer quadrant: Secondary | ICD-10-CM | POA: Insufficient documentation

## 2022-10-06 ENCOUNTER — Other Ambulatory Visit: Payer: Self-pay

## 2022-10-12 ENCOUNTER — Other Ambulatory Visit: Payer: 59

## 2022-10-20 ENCOUNTER — Other Ambulatory Visit: Payer: Self-pay

## 2022-10-20 ENCOUNTER — Other Ambulatory Visit (HOSPITAL_COMMUNITY): Payer: Self-pay

## 2022-10-22 ENCOUNTER — Encounter: Payer: Self-pay | Admitting: Nurse Practitioner

## 2022-10-22 ENCOUNTER — Other Ambulatory Visit: Payer: Self-pay

## 2022-10-22 ENCOUNTER — Other Ambulatory Visit (HOSPITAL_COMMUNITY): Payer: Self-pay

## 2022-10-22 ENCOUNTER — Ambulatory Visit (INDEPENDENT_AMBULATORY_CARE_PROVIDER_SITE_OTHER): Payer: 59 | Admitting: Nurse Practitioner

## 2022-10-22 VITALS — BP 118/82 | HR 85 | Temp 97.1°F | Ht 65.0 in | Wt 148.0 lb

## 2022-10-22 DIAGNOSIS — C50411 Malignant neoplasm of upper-outer quadrant of right female breast: Secondary | ICD-10-CM | POA: Diagnosis not present

## 2022-10-22 DIAGNOSIS — F19981 Other psychoactive substance use, unspecified with psychoactive substance-induced sexual dysfunction: Secondary | ICD-10-CM

## 2022-10-22 DIAGNOSIS — F529 Unspecified sexual dysfunction not due to a substance or known physiological condition: Secondary | ICD-10-CM

## 2022-10-22 DIAGNOSIS — Z170 Estrogen receptor positive status [ER+]: Secondary | ICD-10-CM | POA: Diagnosis not present

## 2022-10-22 DIAGNOSIS — R37 Sexual dysfunction, unspecified: Secondary | ICD-10-CM

## 2022-10-22 MED ORDER — BUPROPION HCL 75 MG PO TABS
75.0000 mg | ORAL_TABLET | Freq: Two times a day (BID) | ORAL | 1 refills | Status: DC
  Filled 2022-10-22: qty 180, 90d supply, fill #0
  Filled 2023-01-28: qty 180, 90d supply, fill #1

## 2022-10-22 MED ORDER — SEMAGLUTIDE-WEIGHT MANAGEMENT 2.4 MG/0.75ML ~~LOC~~ SOAJ
2.4000 mg | SUBCUTANEOUS | 5 refills | Status: DC
  Filled 2022-10-22 – 2022-11-10 (×2): qty 3, 28d supply, fill #0
  Filled 2022-12-03: qty 3, 28d supply, fill #1
  Filled 2022-12-31: qty 3, 28d supply, fill #2
  Filled 2023-01-28: qty 3, 28d supply, fill #3

## 2022-10-22 NOTE — Patient Instructions (Signed)
Begin Wellbutrin 75 mg twice daily (take 75 mg once daily for 3 days) then increase Notify office of any side effects Follow up in 3 months   Cancer-Related Sexual Dysfunction, Female A healthy sex life is an important part of being human. Unfortunately, cancer can affect your sex life before, during, and after treatment. Cancer and cancer treatments can change the way you look and how you feel about yourself. They can also cause changes in your sex organs and affect your desire and ability to have sex. This is called cancer-related sexual dysfunction. How cancer affects you sexually depends on your age, the type of cancer you have, your treatment, and your history of having sex. It is important to be honest and open with your partner and your cancer care team about how cancer and its treatment are affecting your sex life. Knowing what to expect and what treatments are available can help you manage cancer-related sexual problems. What are the causes? This condition may be caused by: Emotions associated with the diagnosis of cancer, such as fear, anger, anxiety, depression, fatigue, and stress. All of these can reduce your desire and ability to have or enjoy sex. Radiation therapy to treat cancers of the ovaries, uterus, uterine lining (endometrium), fallopian tubes, vagina, cervix, or vulva. This treatment can cause vaginal dryness, numbness, and shrinking or scarring of tissues. The result can be painful sex. Radiation therapy can also affect your ovaries and may cause early menopause or an inability to become pregnant (infertility). Chemotherapy. This treatment can cause nausea, vomiting, and fatigue. Some types can also cause the ovaries to fail, which can lead to early menopause and infertility. Pelvic (gynecologic) surgery. This surgery can change the shape or size of the vagina. Surgery to remove both the ovaries will cause early menopause and infertility. Colon and bladder cancer surgery. After  this surgery, you may have to empty your bladder or colon through an opening in your abdomen. This may make you self-conscious about your physical appearance. Breast cancer surgery, which changes the appearance of the breasts. You may become self-conscious about your physical appearance. Hormone medicines that are used to treat some cancers. Side effects may cause hot flashes, a dry vagina, and loss of sexual desire. Other medicines used for cancer treatment. For example, pain medicines and antidepressants can affect sexual desire. What are the signs or symptoms? Common signs and symptoms of this condition include: Loss of interest in sex. Dry vagina. Difficulty getting stimulated during sex. Pain during sex. Inability or difficulty in having an orgasm. Loss of menstrual periods. Infertility. Relationship problems. How is this diagnosed?  This condition may be diagnosed based on questions your health care provider asks you about your sexual health. If your health care provider does not ask you about your sexual health, bring it up. Ask how cancer and its treatments might affect you sexually. It is important to be open about any symptoms or concerns that you have. If it is difficult to get the support you want from your health care provider, seek help from a professional sex therapist. Your health care provider may also: Do a physical exam that includes a gynecological exam. Ask about anxiety, depression, and alcohol or drug use. Do blood tests to check your sex hormone levels. Discuss your relationship and sexual history in order to understand the importance of sex in your life. How is this treated? Treatment depends on the type of sexual dysfunction you have. Some common treatments include: Working with a sexual  health specialist or a sex therapist who will help you plan how to resume pleasant sexual activities when you, your body, and your partner are ready. Using vaginal lubrication or  moisturizer. Using a vaginal estrogen cream or hormone replacement therapy. Using birth control, if pregnancy could be dangerous during cancer treatment. Birth control may be recommended if your treatment involves radiation or chemotherapy. Pelvic muscle physical therapy. Vaginal stretching exercises. These may be done if your vagina has changed in size. Antidepressant or anti-anxiety medicine to treat other symptoms that affect you sexually. Egg or embryo banking (cryopreservation). This may be recommended for you. It involves working with a fertility specialist. Follow these instructions at home: Lifestyle  Get 30 minutes of moderate-intensity exercise every day. Maintain a healthy weight. Lose weight if you are overweight. Limit alcohol intake to no more than one drink a day for women who are not pregnant. Be aware of how much alcohol is in your drink. In the [place] one drink equals one 12 oz bottle of beer (355 mL), one 5 oz glass of wine (148 mL), or one 1 oz glass of hard liquor (44 mL). Do not use any products that contain nicotine or tobacco, such as cigarettes, e-cigarettes, and chewing tobacco. If you need help quitting, ask your health care provider. Sexual activity Be open and honest with your partner about what is likely to change with your cancer treatment. Include your partner in decisions about how to treat sexual dysfunction. Remember that sexual intimacy involves more than intercourse. It can also include other activities, such as caressing, touching, and holding each other. Many couples find that they need to change how they are intimate with each other. Listen to your partner's concerns about his or her sexual health also. Discuss what will work for the two of you. The changes in your ability to have sex may cause a sense of loss or a type of grief. If this happens: Ask your health care provider about a therapist you could work with. Contact the American Association of Sexuality  Educators, Counselors and Therapists (AASECT) to find a therapist near you. Ask your cancer health care provider: What type of sex is safe for you. If you should use birth control while having sex and for how long. For ideas about how to resume your sex life again. For strategies for returning to an active sex life. If you should see a therapist for individual or couples counseling. General instructions Take over-the-counter and prescription medicines only as told by your health care provider. Follow instruction from your health care provider about how to regain your ability to have sex. This can be difficult if sexual interest is lost, but it is very important to practice. Consider joining a cancer support group. Ask your health care provider to recommend one near you, or find an online resource such as www.cancersupportcommunity.org Keep all follow-up visits as told by your health care provider. This is important. Where to find more information American Cancer Society: www.cancer.[place]: www.cancer.[place] of Clinical Oncology: www.cancer.net AASECT: www.aasect.org Contact a health care provider if: You have lost your desire or ability to have sex. You have problems with orgasm. Sex has become painful. Fear, anxiety, anger, or sadness is interfering with your sex life. Get help right away if: You are feeling severely depressed or anxious. You are thinking of hurting yourself or others. If you ever feel like you may hurt yourself or others, or have thoughts about taking your own  life, get help right away. Go to your nearest emergency department or: Call your local emergency services (911 in the [place] Call a suicide crisis helpline, such as the [facility] at 610-397-6783 or 988 in the [place]. This is open 24 hours a day in the [place]. Text the Crisis Text Line at 757-130-1534 (in the [place].). Summary A healthy sex life is a very  important part of being human. Cancer can affect your sex life mentally and physically before, during, and after treatment. Cancer and its treatment can reduce your interest in sex and your ability to have or enjoy sex. Be open and honest with your partner and your cancer care team about problems with sex. Ask your cancer care team how cancer treatment may affect your sex life and what you might be able to do about it. Cancer-related sexual dysfunction can be treated in various ways. Talk with your health care provider about options that might help you. This information is not intended to replace advice given to you by your health care provider. Make sure you discuss any questions you have with your health care provider. Document Revised: 04/22/2021 Document Reviewed: 09/04/2019 Elsevier Patient Education  [place].

## 2022-10-22 NOTE — Progress Notes (Signed)
Acute Office Visit  Subjective:    Patient ID: Sheri Moore, female    DOB: 20-Dec-1980, 42 y.o.   MRN: [id number]  Chief Complaint  Patient presents with  . Decreased Libido    HPI: Patient is in today for decreased libido. Onset was 6 months ago. She underwent right breast cancer treatment with radiation and lumpectomy. Currently prescribed Femara, for the next 10 year.  Pt has history of breast cancer.  Past Medical History:  Diagnosis Date  . Anxiety   . Breast cancer ([place]) 07/06/21   Right Breast, first seen on mammogram done 07/06/21  . Depression   . Family history of breast cancer   . Family history of melanoma   . Family history of uterine cancer   . Fibroid   . Gastroschisis     Past Surgical History:  Procedure Laterality Date  . ABDOMINAL HYSTERECTOMY     supracervical hysterectomy  . ABDOMINAL SURGERY     as a newborn, gastrogheschesis  . BREAST LUMPECTOMY Right 09/10/2021  . BREAST LUMPECTOMY WITH RADIOACTIVE SEED AND SENTINEL LYMPH NODE BIOPSY Right 09/10/2021   Procedure: RIGHT BREAST LUMPECTOMY WITH RADIOACTIVE SEED AND SENTINEL LYMPH NODE BIOPSY;  Surgeon: Stark Klein, MD;  Location: [facility];  Service: General;  Laterality: Right;  . OOPHORECTOMY      Family History  Problem Relation Age of Onset  . Uterine cancer Mother 65  . Diabetes Mother   . Hypertension Mother   . Thyroid disease Mother   . Obesity Mother   . Subarachnoid hemorrhage Father   . Melanoma Father 61  . Diabetes Father   . Diabetes Sister   . Obesity Sister   . Diabetes Maternal Grandmother   . Hypertension Maternal Grandmother   . Melanoma Paternal Grandfather 61  . Diabetes Paternal Grandfather   . Breast cancer Other 57       MGMs sister  . Breast cancer Other 74       MGF's sister  . Breast cancer Other        dx < 71; PGMs sister    Social History   Socioeconomic History  . Marital status: Married    Spouse name: Not on file  . Number of children: 1  .  Years of education: College  . Highest education level: Not on file  Occupational History  . Occupation: [employer]  Tobacco Use  . Smoking status: Former    Packs/day: 0.25    Years: 3.00    Total pack years: 0.75    Types: Cigarettes    Quit date: 10/11/2001    Years since quitting: 21.0  . Smokeless tobacco: Former  Media planner  . Vaping Use: Never used  Substance and Sexual Activity  . Alcohol use: Not Currently    Comment: occ  . Drug use: No  . Sexual activity: Yes    Partners: Female    Birth control/protection: Surgical    Comment: supracervical hysterectomy  Other Topics Concern  . Not on file  Social History Narrative   Lives with husband and son   Caffeine use: rarely   Right handed    Social Determinants of Health   Financial Resource Strain: Not on file  Food Insecurity: Not on file  Transportation Needs: Not on file  Physical Activity: Not on file  Stress: Not on file  Social Connections: Not on file  Intimate Partner Violence: Not At Risk (09/08/2021)   Humiliation, Afraid, Rape, and Kick questionnaire   .  Fear of Current or Ex-Partner: No   . Emotionally Abused: No   . Physically Abused: No   . Sexually Abused: No    Outpatient Medications Prior to Visit  Medication Sig Dispense Refill  . amphetamine-dextroamphetamine (ADDERALL XR) 25 MG 24 hr capsule Take 1 capsule by mouth every morning. 30 capsule 0  . escitalopram (LEXAPRO) 10 MG tablet Take 1 tablet (10 mg total) by mouth daily. 90 tablet 0  . fluticasone (FLONASE) 50 MCG/ACT nasal spray Place 2 sprays into both nostrils daily.    Marland Kitchen letrozole (FEMARA) 2.5 MG tablet Take 1 tablet (2.5 mg total) by mouth daily. 90 tablet 1  . loratadine (CLARITIN) 10 MG tablet Take 10 mg by mouth daily.    Marland Kitchen LORazepam (ATIVAN) 0.5 MG tablet Take 1 tablet (0.5 mg total) by mouth every 8 (eight) hours. (Patient taking differently: Take 0.5 mg by mouth every 8 (eight) hours as needed for anxiety.) 30 tablet 1   . Semaglutide-Weight Management 2.4 MG/0.75ML SOAJ Inject 2.4 mg into the skin once a week. 3 mL 2  . nitrofurantoin, macrocrystal-monohydrate, (MACROBID) 100 MG capsule Take 1 capsule (100 mg total) by mouth 2 (two) times daily. 14 capsule 0   No facility-administered medications prior to visit.    No Known Allergies  Review of Systems  Constitutional:  Negative for chills, fatigue and fever.  HENT:  Negative for congestion, ear pain, rhinorrhea and sore throat.   Respiratory:  Negative for cough and shortness of breath.   Cardiovascular:  Negative for chest pain.  Gastrointestinal:  Negative for abdominal pain, constipation, diarrhea, nausea and vomiting.  Genitourinary:  Negative for dysuria and urgency.  Musculoskeletal:  Negative for back pain and myalgias.  Neurological:  Negative for dizziness, weakness, light-headedness and headaches.  Psychiatric/Behavioral:  Negative for dysphoric mood. The patient is not nervous/anxious.        Objective:    Physical Exam  Pulse 85   Temp (!) 97.1 F (36.2 C)   Ht '5\' 5"'$  (1.651 m)   Wt 148 lb (67.1 kg)   SpO2 99%   BMI 24.63 kg/m  Wt Readings from Last 3 Encounters:  10/22/22 148 lb (67.1 kg)  08/26/22 151 lb (68.5 kg)  07/16/22 156 lb (70.8 kg)    Health Maintenance Due  Topic Date Due  . PAP SMEAR-Modifier  11/18/2020    There are no preventive care reminders to display for this patient.   Lab Results  Component Value Date   TSH 1.510 06/02/2022   Lab Results  Component Value Date   WBC 5.5 06/18/2022   HGB 13.5 06/18/2022   HCT 38.5 06/18/2022   MCV 83.3 06/18/2022   PLT 277 06/18/2022   Lab Results  Component Value Date   NA 138 06/18/2022   K 3.5 06/18/2022   CO2 28 06/18/2022   GLUCOSE 160 (H) 06/18/2022   BUN 14 06/18/2022   CREATININE 0.85 06/18/2022   BILITOT 0.8 06/18/2022   ALKPHOS 58 06/18/2022   AST 32 06/18/2022   ALT 44 06/18/2022   PROT 6.7 06/18/2022   ALBUMIN 4.4 06/18/2022    CALCIUM 9.6 06/18/2022   ANIONGAP 8 06/18/2022   EGFR 100 06/02/2022   Lab Results  Component Value Date   CHOL 217 (H) 06/02/2022   Lab Results  Component Value Date   HDL 51 06/02/2022   Lab Results  Component Value Date   LDLCALC 139 (H) 06/02/2022   Lab Results  Component Value Date  TRIG 151 (H) 06/02/2022   Lab Results  Component Value Date   CHOLHDL 4.3 06/02/2022   No results found for: "HGBA1C"     Assessment & Plan:  There are no diagnoses linked to this encounter.  No orders of the defined types were placed in this encounter.   No orders of the defined types were placed in this encounter.    Follow-up: No follow-ups on file.  An After Visit Summary was printed and given to the patient.  Rip Harbour, NP [place] [phone]

## 2022-10-25 ENCOUNTER — Ambulatory Visit (INDEPENDENT_AMBULATORY_CARE_PROVIDER_SITE_OTHER): Payer: 59

## 2022-10-25 DIAGNOSIS — R6883 Chills (without fever): Secondary | ICD-10-CM

## 2022-10-25 DIAGNOSIS — R509 Fever, unspecified: Secondary | ICD-10-CM | POA: Diagnosis not present

## 2022-10-25 LAB — POCT INFLUENZA A/B
Influenza A, POC: POSITIVE — AB
Influenza B, POC: NEGATIVE

## 2022-10-25 NOTE — Patient Instructions (Signed)
Rest and force fluids.  Tylenol or Advil as needed for bodyache.

## 2022-10-26 ENCOUNTER — Other Ambulatory Visit (HOSPITAL_COMMUNITY): Payer: Self-pay

## 2022-10-27 ENCOUNTER — Other Ambulatory Visit (HOSPITAL_COMMUNITY): Payer: Self-pay

## 2022-11-03 ENCOUNTER — Other Ambulatory Visit: Payer: Self-pay

## 2022-11-05 ENCOUNTER — Other Ambulatory Visit (HOSPITAL_COMMUNITY): Payer: Self-pay

## 2022-11-10 ENCOUNTER — Other Ambulatory Visit (HOSPITAL_COMMUNITY): Payer: Self-pay

## 2022-11-10 ENCOUNTER — Other Ambulatory Visit: Payer: Self-pay

## 2022-11-11 ENCOUNTER — Other Ambulatory Visit: Payer: Self-pay

## 2022-11-11 DIAGNOSIS — F900 Attention-deficit hyperactivity disorder, predominantly inattentive type: Secondary | ICD-10-CM

## 2022-11-11 MED ORDER — AMPHETAMINE-DEXTROAMPHET ER 25 MG PO CP24
25.0000 mg | ORAL_CAPSULE | Freq: Every morning | ORAL | 0 refills | Status: DC
  Filled 2022-11-11: qty 30, 30d supply, fill #0

## 2022-11-12 ENCOUNTER — Other Ambulatory Visit: Payer: Self-pay

## 2022-11-12 ENCOUNTER — Other Ambulatory Visit (HOSPITAL_COMMUNITY): Payer: Self-pay

## 2022-11-18 ENCOUNTER — Other Ambulatory Visit: Payer: 59

## 2022-11-23 ENCOUNTER — Ambulatory Visit: Payer: 59 | Admitting: Radiology

## 2022-11-23 ENCOUNTER — Other Ambulatory Visit (HOSPITAL_COMMUNITY): Payer: Self-pay

## 2022-11-24 ENCOUNTER — Other Ambulatory Visit: Payer: Self-pay

## 2022-11-26 ENCOUNTER — Ambulatory Visit: Payer: 59 | Admitting: Radiology

## 2022-12-03 ENCOUNTER — Encounter: Payer: Self-pay | Admitting: Radiology

## 2022-12-03 ENCOUNTER — Other Ambulatory Visit (HOSPITAL_COMMUNITY)
Admission: RE | Admit: 2022-12-03 | Discharge: 2022-12-03 | Disposition: A | Discharge status: Home / Self Care | Payer: 59 | Source: Ambulatory Visit | Attending: Radiology | Admitting: Radiology

## 2022-12-03 ENCOUNTER — Ambulatory Visit (INDEPENDENT_AMBULATORY_CARE_PROVIDER_SITE_OTHER): Payer: 59 | Admitting: Radiology

## 2022-12-03 ENCOUNTER — Other Ambulatory Visit (HOSPITAL_BASED_OUTPATIENT_CLINIC_OR_DEPARTMENT_OTHER): Payer: Self-pay

## 2022-12-03 VITALS — BP 118/84 | Ht 64.5 in | Wt 144.0 lb

## 2022-12-03 DIAGNOSIS — Z01419 Encounter for gynecological examination (general) (routine) without abnormal findings: Secondary | ICD-10-CM | POA: Insufficient documentation

## 2022-12-03 NOTE — Progress Notes (Signed)
   Sheri Moore 1981-03-17 [id number]   History:  42 y.o. G1P1 presents for annual exam s/p supracervical hyst for fibroids. BSO for pain. Has some vaginal dryness, more frequent UTIs, using a lubricant to make intercourse more comfortable. S/p right breast lumpectomy 2022, doing well, no concerning masses. Currently on letrozole.   Gynecologic History Sexually active: yes  Health Maintenance Last Pap: 2019.  Last mammogram: 12/23.    Past medical history, past surgical history, family history and social history were all reviewed and documented in the EPIC chart.  ROS:  A ROS was performed and pertinent positives and negatives are included.  Exam:  Vitals:   12/03/22 1330  BP: 118/84  Weight: 144 lb (65.3 kg)  Height: 5' 4.5" (1.638 m)   Body mass index is 24.34 kg/m.  General appearance:  Normal Thyroid:  Symmetrical, normal in size, without palpable masses or nodularity. Respiratory  Auscultation:  Clear without wheezing or rhonchi Cardiovascular  Auscultation:  Regular rate, without rubs, murmurs or gallops  Edema/varicosities:  Not grossly evident Abdominal  Soft,nontender, without masses, guarding or rebound.  Liver/spleen:  No organomegaly noted  Hernia:  None appreciated  Skin  Inspection:  Grossly normal Breasts: Examined lying and sitting.   Right: Lumpectomy scar upper outer quadrant. Without masses, retractions, nipple discharge or axillary adenopathy.   Left: Without masses, retractions, nipple discharge or axillary adenopathy. Genitourinary   Inguinal/mons:  Normal without inguinal adenopathy  External genitalia:  Normal appearing vulva with no masses, tenderness, or lesions  BUS/Urethra/Skene's glands:  Normal  Vagina:  Normal appearing with normal color and discharge, no lesions. Atrophy mild  Cervix:  normal appearing without lesions or d/c, pap obtained  Uterus:  absent  Adnexa/parametria:  surgically absent bilaterally  Anus and  perineum: Normal    Patient informed chaperone available to be present for breast and pelvic exam. Patient has requested no chaperone to be present. Patient has been advised what will be completed during breast and pelvic exam.   Assessment/Plan:   1. Well woman exam with routine gynecological exam - May try vitamin E suppositories or revaree for dryness - Cytology - PAP( [facility]     Discussed SBE, colonoscopy and DEXA screening as appropriate. Encouraged 179mns/week of cardiovascular and weight bearing exercise minimum. Recommend the use of seatbelts and sunscreen consistently.   Return in 1 year for annual or sooner prn.  CRubbie BattiestB WHNP-BC 1:59 PM 12/03/2022

## 2022-12-06 ENCOUNTER — Ambulatory Visit (INDEPENDENT_AMBULATORY_CARE_PROVIDER_SITE_OTHER): Payer: 59 | Admitting: Family Medicine

## 2022-12-06 ENCOUNTER — Encounter: Payer: Self-pay | Admitting: Family Medicine

## 2022-12-06 VITALS — BP 126/74 | HR 92 | Temp 97.2°F | Ht 64.5 in | Wt 142.0 lb

## 2022-12-06 DIAGNOSIS — N10 Acute tubulo-interstitial nephritis: Secondary | ICD-10-CM | POA: Diagnosis not present

## 2022-12-06 DIAGNOSIS — N3000 Acute cystitis without hematuria: Secondary | ICD-10-CM | POA: Diagnosis not present

## 2022-12-06 LAB — POCT URINALYSIS DIP (CLINITEK)
Blood, UA: NEGATIVE
Glucose, UA: NEGATIVE mg/dL
Nitrite, UA: NEGATIVE
POC PROTEIN,UA: 30 — AB
Spec Grav, UA: >=1.03 — AB (ref 1.010–1.025)
Urobilinogen, UA: 1 E.U./dL
pH, UA: 5 (ref 5.0–8.0)

## 2022-12-06 MED ORDER — SULFAMETHOXAZOLE-TRIMETHOPRIM 800-160 MG PO TABS: 1.0000 | ORAL_TABLET | Freq: Two times a day (BID) | ORAL | 0 refills | Status: DC

## 2022-12-06 MED ORDER — CEFTRIAXONE SODIUM 1 G IJ SOLR
1.0000 g | Freq: Once | INTRAMUSCULAR | Status: AC
  Administered 2022-12-06: 1 g via INTRAMUSCULAR

## 2022-12-06 NOTE — Progress Notes (Signed)
Acute Office Visit  Subjective:    Patient ID: Sheri Moore, female    DOB: May 08, 1981, 42 y.o.   MRN: [id number]  Chief Complaint  Patient presents with   Urinary Tract Infection    HPI: Patient is in today for low back pain x 5 days. No fever, chills, abdominal pain, nausea, or vomiting. UA positive for UTI.Marland Kitchen  Past Medical History:  Diagnosis Date   Anxiety    Breast cancer ([place]) 07/06/21   Right Breast, first seen on mammogram done 07/06/21   Depression    Family history of breast cancer    Family history of melanoma    Family history of uterine cancer    Fibroid    Gastroschisis     Past Surgical History:  Procedure Laterality Date   ABDOMINAL HYSTERECTOMY     supracervical hysterectomy   ABDOMINAL SURGERY     as a newborn, gastrogheschesis   BREAST LUMPECTOMY Right 09/10/2021   BREAST LUMPECTOMY WITH RADIOACTIVE SEED AND SENTINEL LYMPH NODE BIOPSY Right 09/10/2021   Procedure: RIGHT BREAST LUMPECTOMY WITH RADIOACTIVE SEED AND SENTINEL LYMPH NODE BIOPSY;  Surgeon: Stark Klein, MD;  Location: [facility];  Service: General;  Laterality: Right;   OOPHORECTOMY      Family History  Problem Relation Age of Onset   Uterine cancer Mother 32   Diabetes Mother    Hypertension Mother    Thyroid disease Mother    Obesity Mother    Subarachnoid hemorrhage Father    Melanoma Father 28   Diabetes Father    Diabetes Sister    Obesity Sister    Diabetes Maternal Grandmother    Hypertension Maternal Grandmother    Melanoma Paternal Grandfather 67   Diabetes Paternal Grandfather    Breast cancer Other 61       MGMs sister   Breast cancer Other 60       MGF's sister   Breast cancer Other        dx < 66; PGMs sister    Social History   Socioeconomic History   Marital status: Married    Spouse name: Not on file   Number of children: 1   Years of education: College   Highest education level: Not on file  Occupational History   Occupation: [employer]   Tobacco Use   Smoking status: Former    Packs/day: 0.25    Years: 3.00    Total pack years: 0.75    Types: Cigarettes    Quit date: 10/11/2001    Years since quitting: 21.1   Smokeless tobacco: Former  Scientific laboratory technician Use: Never used  Substance and Sexual Activity   Alcohol use: Not Currently    Comment: occ   Drug use: No   Sexual activity: Yes    Partners: Male    Birth control/protection: Surgical    Comment: supracervical hysterectomy, menarche 42yo, sexual debut 42yo  Other Topics Concern   Not on file  Social History Narrative   Lives with husband and son   Caffeine use: rarely   Right handed    Social Determinants of Health   Financial Resource Strain: Not on file  Food Insecurity: Not on file  Transportation Needs: Not on file  Physical Activity: Not on file  Stress: Not on file  Social Connections: Not on file  Intimate Partner Violence: Not At Risk (09/08/2021)   Humiliation, Afraid, Rape, and Kick questionnaire    Fear of Current or Ex-Partner:  No    Emotionally Abused: No    Physically Abused: No    Sexually Abused: No    Outpatient Medications Prior to Visit  Medication Sig Dispense Refill   amphetamine-dextroamphetamine (ADDERALL XR) 25 MG 24 hr capsule Take 1 capsule by mouth every morning. 30 capsule 0   buPROPion (WELLBUTRIN) 75 MG tablet Take 1 tablet (75 mg total) by mouth 2 (two) times daily. 180 tablet 1   fluticasone (FLONASE) 50 MCG/ACT nasal spray Place 2 sprays into both nostrils daily.     letrozole (FEMARA) 2.5 MG tablet Take 1 tablet (2.5 mg total) by mouth daily. 90 tablet 1   loratadine (CLARITIN) 10 MG tablet Take 10 mg by mouth daily.     LORazepam (ATIVAN) 0.5 MG tablet Take 1 tablet (0.5 mg total) by mouth every 8 (eight) hours. (Patient not taking: Reported on 12/03/2022) 30 tablet 1   Semaglutide-Weight Management 2.4 MG/0.75ML SOAJ Inject 2.4 mg into the skin once a week. 3 mL 5   No facility-administered medications prior to  visit.    No Known Allergies  Review of Systems  Constitutional:  Negative for chills and fever.  Gastrointestinal:  Negative for abdominal pain, nausea and vomiting.  Genitourinary:  Negative for dysuria, frequency and urgency.  Musculoskeletal:  Positive for back pain.       Objective:        12/06/2022    7:39 AM 12/03/2022    1:30 PM 10/22/2022    8:09 AM  Vitals with BMI  Height 5' 4.5" 5' 4.5" '5\' 5"'$   Weight 142 lbs 144 lbs 148 lbs  BMI 24.01 123456 [id number]  Systolic [id number] 123456 123456  Diastolic 74 84 82  Pulse 92  85    No data found.   Physical Exam Vitals reviewed.  Constitutional:      Appearance: Normal appearance. She is normal weight.  Cardiovascular:     Rate and Rhythm: Normal rate and regular rhythm.     Heart sounds: Normal heart sounds.  Pulmonary:     Effort: Pulmonary effort is normal. No respiratory distress.     Breath sounds: Normal breath sounds.  Abdominal:     General: Abdomen is flat. Bowel sounds are normal.     Palpations: Abdomen is soft.     Tenderness: There is no abdominal tenderness. There is no right CVA tenderness or left CVA tenderness.  Neurological:     Mental Status: She is alert and oriented to person, place, and time.  Psychiatric:        Mood and Affect: Mood normal.        Behavior: Behavior normal.     Health Maintenance Due  Topic Date Due   PAP SMEAR-Modifier  11/18/2020   COVID-19 Vaccine (4 - 2023-24 season) 11/08/2022    There are no preventive care reminders to display for this patient.   Lab Results  Component Value Date   TSH 1.510 06/02/2022   Lab Results  Component Value Date   WBC 5.5 06/18/2022   HGB 13.5 06/18/2022   HCT 38.5 06/18/2022   MCV 83.3 06/18/2022   PLT 277 06/18/2022   Lab Results  Component Value Date   NA 138 06/18/2022   K 3.5 06/18/2022   CO2 28 06/18/2022   GLUCOSE 160 (H) 06/18/2022   BUN 14 06/18/2022   CREATININE 0.85 06/18/2022   BILITOT 0.8 06/18/2022   ALKPHOS 58  06/18/2022   AST 32 06/18/2022   ALT 44 06/18/2022  PROT 6.7 06/18/2022   ALBUMIN 4.4 06/18/2022   CALCIUM 9.6 06/18/2022   ANIONGAP 8 06/18/2022   EGFR 100 06/02/2022   Lab Results  Component Value Date   CHOL 217 (H) 06/02/2022   Lab Results  Component Value Date   HDL 51 06/02/2022   Lab Results  Component Value Date   LDLCALC 139 (H) 06/02/2022   Lab Results  Component Value Date   TRIG 151 (H) 06/02/2022   Lab Results  Component Value Date   CHOLHDL 4.3 06/02/2022   No results found for: "HGBA1C"     Assessment & Plan:  Acute pyelonephritis Rocephin given.  Bactrim sent.     Meds ordered this encounter  Medications   sulfamethoxazole-trimethoprim (BACTRIM DS) 800-160 MG tablet    Sig: Take 1 tablet by mouth 2 (two) times daily.    Dispense:  10 tablet    Refill:  0   cefTRIAXone (ROCEPHIN) injection 1 g    Orders Placed This Encounter  Procedures   Urine Culture   POCT URINALYSIS DIP (CLINITEK)     Follow-up: Return if symptoms worsen or fail to improve.  An After Visit Summary was printed and given to the patient.  Rochel Brome, MD [facility] [phone]

## 2022-12-06 NOTE — Assessment & Plan Note (Signed)
Rocephin given.  Bactrim sent.

## 2022-12-08 LAB — CYTOLOGY - PAP: Diagnosis: NEGATIVE

## 2022-12-08 LAB — URINE CULTURE

## 2022-12-09 ENCOUNTER — Other Ambulatory Visit: Payer: Self-pay

## 2022-12-09 MED ORDER — AMOXICILLIN 250 MG PO CAPS: 250.0000 mg | ORAL_CAPSULE | Freq: Two times a day (BID) | ORAL | 0 refills | Status: DC

## 2022-12-10 DIAGNOSIS — C50111 Malignant neoplasm of central portion of right female breast: Secondary | ICD-10-CM | POA: Diagnosis not present

## 2022-12-13 DIAGNOSIS — C50111 Malignant neoplasm of central portion of right female breast: Secondary | ICD-10-CM | POA: Diagnosis not present

## 2022-12-15 DIAGNOSIS — C50111 Malignant neoplasm of central portion of right female breast: Secondary | ICD-10-CM | POA: Diagnosis not present

## 2022-12-16 ENCOUNTER — Other Ambulatory Visit: Payer: Self-pay

## 2022-12-16 DIAGNOSIS — C50111 Malignant neoplasm of central portion of right female breast: Secondary | ICD-10-CM

## 2022-12-17 ENCOUNTER — Inpatient Hospital Stay: Payer: 59 | Attending: Hematology

## 2022-12-17 ENCOUNTER — Other Ambulatory Visit: Payer: Self-pay

## 2022-12-17 ENCOUNTER — Encounter: Payer: Self-pay | Admitting: Pharmacist

## 2022-12-17 ENCOUNTER — Inpatient Hospital Stay (HOSPITAL_BASED_OUTPATIENT_CLINIC_OR_DEPARTMENT_OTHER): Payer: 59 | Admitting: Hematology

## 2022-12-17 VITALS — BP 137/89 | HR 80 | Temp 97.9°F | Resp 15 | Ht 64.5 in | Wt 145.1 lb

## 2022-12-17 DIAGNOSIS — Z79899 Other long term (current) drug therapy: Secondary | ICD-10-CM | POA: Insufficient documentation

## 2022-12-17 DIAGNOSIS — C50111 Malignant neoplasm of central portion of right female breast: Secondary | ICD-10-CM

## 2022-12-17 DIAGNOSIS — Z170 Estrogen receptor positive status [ER+]: Secondary | ICD-10-CM | POA: Insufficient documentation

## 2022-12-17 DIAGNOSIS — Z923 Personal history of irradiation: Secondary | ICD-10-CM | POA: Insufficient documentation

## 2022-12-17 DIAGNOSIS — Z79811 Long term (current) use of aromatase inhibitors: Secondary | ICD-10-CM | POA: Insufficient documentation

## 2022-12-17 DIAGNOSIS — F900 Attention-deficit hyperactivity disorder, predominantly inattentive type: Secondary | ICD-10-CM

## 2022-12-17 LAB — CBC WITH DIFFERENTIAL (CANCER CENTER ONLY)
Abs Immature Granulocytes: 0.01 10*3/uL (ref 0.00–0.07)
Basophils Absolute: 0 10*3/uL (ref 0.0–0.1)
Basophils Relative: 1 %
Eosinophils Absolute: 0.1 10*3/uL (ref 0.0–0.5)
Eosinophils Relative: 1 %
HCT: 38 % (ref 36.0–46.0)
Hemoglobin: 13.7 g/dL (ref 12.0–15.0)
Immature Granulocytes: 0 %
Lymphocytes Relative: 24 %
Lymphs Abs: 1.1 10*3/uL (ref 0.7–4.0)
MCH: 29.3 pg (ref 26.0–34.0)
MCHC: 36.1 g/dL — ABNORMAL HIGH (ref 30.0–36.0)
MCV: 81.2 fL (ref 80.0–100.0)
Monocytes Absolute: 0.3 10*3/uL (ref 0.1–1.0)
Monocytes Relative: 7 %
Neutro Abs: 3.1 10*3/uL (ref 1.7–7.7)
Neutrophils Relative %: 67 %
Platelet Count: 265 10*3/uL (ref 150–400)
RBC: 4.68 MIL/uL (ref 3.87–5.11)
RDW: 12.4 % (ref 11.5–15.5)
WBC Count: 4.7 10*3/uL (ref 4.0–10.5)
nRBC: 0 % (ref 0.0–0.2)

## 2022-12-17 LAB — CMP (CANCER CENTER ONLY)
ALT: 22 U/L (ref 0–44)
AST: 21 U/L (ref 15–41)
Albumin: 4.6 g/dL (ref 3.5–5.0)
Alkaline Phosphatase: 74 U/L (ref 38–126)
Anion gap: 6 (ref 5–15)
BUN: 15 mg/dL (ref 6–20)
CO2: 30 mmol/L (ref 22–32)
Calcium: 9.5 mg/dL (ref 8.9–10.3)
Chloride: 106 mmol/L (ref 98–111)
Creatinine: 0.75 mg/dL (ref 0.44–1.00)
GFR, Estimated: >60 mL/min (ref >60)
Glucose, Bld: 90 mg/dL (ref 70–99)
Potassium: 4.3 mmol/L (ref 3.5–5.1)
Sodium: 142 mmol/L (ref 135–145)
Total Bilirubin: 0.7 mg/dL (ref 0.3–1.2)
Total Protein: 7.1 g/dL (ref 6.5–8.1)

## 2022-12-17 MED ORDER — AMPHETAMINE-DEXTROAMPHET ER 25 MG PO CP24
25.0000 mg | ORAL_CAPSULE | Freq: Every morning | ORAL | 0 refills | Status: DC
  Filled 2022-12-17 – 2022-12-23 (×2): qty 30, 30d supply, fill #0

## 2022-12-17 MED ORDER — LETROZOLE 2.5 MG PO TABS
2.5000 mg | ORAL_TABLET | Freq: Every day | ORAL | 1 refills | Status: DC
  Filled 2022-12-17 – 2023-03-02 (×2): qty 90, 90d supply, fill #0
  Filled 2023-05-30: qty 90, 90d supply, fill #1

## 2022-12-17 NOTE — Telephone Encounter (Signed)
Please write as brand name only per patient.

## 2022-12-17 NOTE — Assessment & Plan Note (Signed)
Stage IA, p(T1c, N0), ER+/PR+/HER2-, Grade 2, RS 22 -found on screening mammogram. Biopsy 08/04/21 confirmed invasive ductal carcinoma with DCIS. -right lumpectomy on 09/10/21 under Dr. Barry Dienes showed 1.2 cm IDC, grade 3, with small foci of DCIS. -Oncotype RS of 22, low risk.   -s/p adjuvant radiation under Dr. Lisbeth Renshaw, 11/03/21 - 12/17/21.   -she began letrozole in late 12/2021. She is tolerating well with no noticeable side effects. -she is clinically doing well, no new concerns. Labs reviewed, all WNL. Physical exam was unremarkable. There is no clinical concern for recurrence. -Her last diagnostic mammogram in October 2023 was benign, she did have additional left-sided diagnostic mammogram in December 2023 which showed a lipoma at the 4 o'clock position, this will be followed in 6 months by mammogram again

## 2022-12-18 ENCOUNTER — Other Ambulatory Visit (HOSPITAL_COMMUNITY): Payer: Self-pay

## 2022-12-18 ENCOUNTER — Encounter: Payer: Self-pay | Admitting: Hematology

## 2022-12-18 NOTE — Progress Notes (Signed)
[facility]   Telephone:(336) [phone] Fax:([phone]) [phone]   Clinic Follow up Note   Patient Care Team: Rochel Brome, MD as PCP - General (Family Medicine) Truitt Merle, MD as Consulting Physician (Hematology) Stark Klein, MD as Consulting Physician (General Surgery) Kyung Rudd, MD as Consulting Physician (Radiation Oncology)  Date of Service:  12/18/2022  CHIEF COMPLAINT: f/u of right breast cancer  CURRENT THERAPY:  Letrozole 2.5 mg daily  ASSESSMENT:  Sheri Moore is a 42 y.o. female with   Cancer of central portion of right breast ([place]) Stage IA, p(T1c, N0), ER+/PR+/HER2-, Grade 2, RS 22 -found on screening mammogram. Biopsy 08/04/21 confirmed invasive ductal carcinoma with DCIS. -right lumpectomy on 09/10/21 under Dr. Barry Dienes showed 1.2 cm IDC, grade 3, with small foci of DCIS. -Oncotype RS of 22, low risk.   -s/p adjuvant radiation under Dr. Lisbeth Renshaw, 11/03/21 - 12/17/21.   -she began letrozole in late 12/2021. She is tolerating well with no noticeable side effects. -she is clinically doing well, no new concerns. Labs reviewed, all WNL. Physical exam was unremarkable. There is no clinical concern for recurrence. -Her last diagnostic mammogram in October 2023 was benign, she did have additional left-sided diagnostic mammogram in December 2023 which showed a lipoma at the 4 o'clock position, this will be followed in 6 months by mammogram again  -She is clinically doing very well, lab reviewed, exam was unremarkable, there is no clinical concern for recurrence -Follow-up 6 months with NP Mendel Ryder   PLAN: -Continue letrozole -Due for left diagnostic mammogram in June 2023, ordered today -Lab and follow-up with NP Mendel Ryder in 6 months   SUMMARY OF ONCOLOGIC HISTORY: Oncology History Overview Note   Cancer Staging  Cancer of central portion of right breast [facility]) Staging form: Breast, AJCC 8th Edition - Clinical stage from 08/04/2021: Stage IA (cT1b, cN0, cM0, G2,  ER+, PR+, HER2-) - Unsigned - Pathologic stage from 09/10/2021: Stage IA (pT1c, pN0, cM0, G2, ER+, PR+, HER2-, Oncotype DX score: 22) - Signed by Truitt Merle, MD on 10/01/2021     Cancer of central portion of right breast ([place])  07/29/2021 Mammogram   EXAM: DIGITAL DIAGNOSTIC UNILATERAL RIGHT MAMMOGRAM WITH TOMOSYNTHESIS AND CAD; ULTRASOUND RIGHT BREAST LIMITED  IMPRESSION: Suspicious mass in the right breast at 9 o'clock measuring 1.0 cm. Targeted ultrasound performed in the right axilla demonstrating normal lymph nodes.   08/04/2021 Initial Biopsy   Diagnosis Breast, right, needle core biopsy, 9 o'clock, ribbon shaped clip - INVASIVE DUCTAL CARCINOMA - DUCTAL CARCINOMA IN SITU - SEE COMMENT Microscopic Comment Based on the biopsy, the carcinoma appears Nottingham grade 2 of 3 and measures 0.9 cm in greatest linear extent.  PROGNOSTIC INDICATORS Results: The tumor cells are EQUIVOCAL for Her2 (2+). Her2 by FISH will be performed and results reported separately. Estrogen Receptor: 100%, POSITIVE, STRONG STAINING INTENSITY Progesterone Receptor: 95%, POSITIVE, STRONG STAINING INTENSITY Proliferation Marker Ki67: 10%  FLUORESCENCE IN-SITU HYBRIDIZATION Results: GROUP 5: HER2 **NEGATIVE**   09/01/2021 Initial Diagnosis   Cancer of central portion of right breast ([place])   09/08/2021 Genetic Testing   Negative genetic testing on the BRCAPlus panel.  The report date is 08/29/2021.  Negative genetic testing on the CancerNext-Expanded+RNAinsight.  DICER1 p.T43M VUS was identified. The report date is 09/08/2021.  The BRCAPlus gene panel offered by [facility] and includes sequencing and rearrangement analysis for the following 6 genes: BRCA1, BRCA2, CDH1, PALB2, PTEN, and TP53.  The CancerNext-Expanded gene panel offered by [facility] and includes  sequencing and rearrangement analysis for the following 77 genes: AIP, ALK, APC*, ATM*, AXIN2, BAP1, BARD1, BLM, BMPR1A, BRCA1*, BRCA2*,  BRIP1*, CDC73, CDH1*, CDK4, CDKN1B, CDKN2A, CHEK2*, CTNNA1, DICER1, FANCC, FH, FLCN, GALNT12, KIF1B, LZTR1, MAX, MEN1, MET, MLH1*, MSH2*, MSH3, MSH6*, MUTYH*, NBN, NF1*, NF2, NTHL1, PALB2*, PHOX2B, PMS2*, POT1, PRKAR1A, PTCH1, PTEN*, RAD51C*, RAD51D*, RB1, RECQL, RET, SDHA, SDHAF2, SDHB, SDHC, SDHD, SMAD4, SMARCA4, SMARCB1, SMARCE1, STK11, SUFU, TMEM127, TP53*, TSC1, TSC2, VHL and XRCC2 (sequencing and deletion/duplication); EGFR, EGLN1, HOXB13, KIT, MITF, PDGFRA, POLD1, and POLE (sequencing only); EPCAM and GREM1 (deletion/duplication only). DNA and RNA analyses performed for * genes.    09/10/2021 Cancer Staging   Staging form: Breast, AJCC 8th Edition - Pathologic stage from 09/10/2021: Stage IA (pT1c, pN0, cM0, G2, ER+, PR+, HER2-, Oncotype DX score: 22) - Signed by Truitt Merle, MD on 10/01/2021 Stage prefix: Initial diagnosis Multigene prognostic tests performed: Oncotype DX Recurrence score range: Greater than or equal to 11 Histologic grading system: 3 grade system Residual tumor (R): R0 - None   09/10/2021 Definitive Surgery   FINAL MICROSCOPIC DIAGNOSIS:  A. BREAST, RIGHT, LUMPECTOMY:  Invasive ductal carcinoma with clear margins of resection.  INVASIVE CARCINOMA OF THE BREAST:  Resection  Procedure: Lumpectomy.  Specimen Laterality: Right.  Histologic Type: Invasive ductal carcinoma.  Histologic Grade:       Glandular (Acinar)/Tubular Differentiation: 3 out of possible 3.       Nuclear Pleomorphism: 2 out of possible 3.       Mitotic Rate: 1 out of possible 3.       Overall Grade: 2 out of possible 3 (from a score of 6 out of  possible 9)  Tumor Size: 1.2 cm. x 1.0 cm. x 0.8 cm.  Ductal Carcinoma In Situ: Present. Small foci without comedo necrosis.  Tumor Extent: Limited to breast parenchyma.  Treatment Effect in the Breast: No known presurgical therapy  Margins: All margins are negative for invasive carcinoma.       Distance from Closest Margin (mm): 7 mm.       Specify Closest  Margin (required only if <51m): Inferior.  DCIS Margins: Uninvolved by DCIS.       Distance from Closest Margin (mm): 6 mm.       Specify Closest Margin (required only if <189m: Posterior.  Regional Lymph Nodes:       Number of Lymph Nodes Examined: Five (5).       Number of Sentinel Nodes Examined: Five (5).       Number of Lymph Nodes with Macrometastases (>2 mm): None (0).       Number of Lymph Nodes with Micrometastases: None (0).       Number of Lymph Nodes with Isolated Tumor Cells (=0.2 mm or =200 cells): None (0).       Size of Largest Metastatic Deposit (mm): N.A.       Extranodal Extension: N.A.  Distant Metastasis:       Distant Site(s) Involved: None known.  Breast Biomarker Testing Performed on Previous Biopsy:       Testing Performed on Case Number: [id number] from 08/04/2021.             Estrogen Receptor: 100% (Strong intensity).             Progesterone Receptor: 95% (Strong intensity).             HER2: 2+ by IHC / Negative by FISH.  Ki-67: 10%  Pathologic Stage Classification (pTNM, AJCC 8th Edition): pT1c, pN0  Representative Tumor Block: A3 and A2.  Comment(s): None.   B. LYMPH NODE, RIGHT (5), SENTINEL, EXCISION:  5 lymph nodes with fatty infiltration which is negative for metastatic  Carcinoma. (0/5)    11/03/2021 - 12/17/2021 Radiation Therapy   Site Technique Total Dose (Gy) Dose per Fx (Gy) Completed Fx Beam Energies  Breast, Right: Breast_R 3D 50.4/50.4 1.8 28/28 6X  Breast, Right: Breast_R_Bst 3D 10/10 2 5/5 6X     12/2021 -  Anti-estrogen oral therapy   Letrozole x 7-10 years      INTERVAL HISTORY:  Sheri Moore is here for a follow up of breast cancer. She was last seen by me 6 months ago. She presents to the clinic alone.  He is clinically doing very well, he felt a small lump in the left breast in December 2023, underwent diagnostic mammogram, which showed a lipoma.  She is tolerating letrozole well, with mild hot flashes, no  significant arthralgia or other side effects.    All other systems were reviewed with the patient and are negative.  MEDICAL HISTORY:  Past Medical History:  Diagnosis Date   Anxiety    Breast cancer ([place]) 07/06/21   Right Breast, first seen on mammogram done 07/06/21   Depression    Family history of breast cancer    Family history of melanoma    Family history of uterine cancer    Fibroid    Gastroschisis     SURGICAL HISTORY: Past Surgical History:  Procedure Laterality Date   ABDOMINAL HYSTERECTOMY     supracervical hysterectomy   ABDOMINAL SURGERY     as a newborn, gastrogheschesis   BREAST LUMPECTOMY Right 09/10/2021   BREAST LUMPECTOMY WITH RADIOACTIVE SEED AND SENTINEL LYMPH NODE BIOPSY Right 09/10/2021   Procedure: RIGHT BREAST LUMPECTOMY WITH RADIOACTIVE SEED AND SENTINEL LYMPH NODE BIOPSY;  Surgeon: Stark Klein, MD;  Location: [facility];  Service: General;  Laterality: Right;   OOPHORECTOMY      I have reviewed the social history and family history with the patient and they are unchanged from previous note.  ALLERGIES:  has No Known Allergies.  MEDICATIONS:  Current Outpatient Medications  Medication Sig Dispense Refill   amoxicillin (AMOXIL) 250 MG capsule Take 1 capsule (250 mg total) by mouth 2 (two) times daily. 10 capsule 0   ADDERALL XR 25 MG 24 hr capsule Take 1 capsule by mouth every morning. 30 capsule 0   buPROPion (WELLBUTRIN) 75 MG tablet Take 1 tablet (75 mg total) by mouth 2 (two) times daily. 180 tablet 1   fluticasone (FLONASE) 50 MCG/ACT nasal spray Place 2 sprays into both nostrils daily.     letrozole (FEMARA) 2.5 MG tablet Take 1 tablet (2.5 mg total) by mouth daily. 90 tablet 1   loratadine (CLARITIN) 10 MG tablet Take 10 mg by mouth daily.     LORazepam (ATIVAN) 0.5 MG tablet Take 1 tablet (0.5 mg total) by mouth every 8 (eight) hours. (Patient not taking: Reported on 12/03/2022) 30 tablet 1   Semaglutide-Weight Management 2.4 MG/0.75ML SOAJ  Inject 2.4 mg into the skin once a week. 3 mL 5   sulfamethoxazole-trimethoprim (BACTRIM DS) 800-160 MG tablet Take 1 tablet by mouth 2 (two) times daily. 10 tablet 0   No current facility-administered medications for this visit.    PHYSICAL EXAMINATION: ECOG PERFORMANCE STATUS: 0 - Asymptomatic  Vitals:   12/17/22 1430  BP: 137/89  Pulse: 80  Resp: 15  Temp: 97.9 F (36.6 C)  SpO2: 100%   Wt Readings from Last 3 Encounters:  12/17/22 145 lb 1.6 oz (65.8 kg)  12/06/22 142 lb (64.4 kg)  12/03/22 144 lb (65.3 kg)     GENERAL:alert, no distress and comfortable SKIN: skin color, texture, turgor are normal, no rashes or significant lesions EYES: normal, Conjunctiva are pink and non-injected, sclera clear NECK: supple, thyroid normal size, non-tender, without nodularity LYMPH:  no palpable lymphadenopathy in the cervical, axillary  LUNGS: clear to auscultation and percussion with normal breathing effort HEART: regular rate & rhythm and no murmurs and no lower extremity edema ABDOMEN:abdomen soft, non-tender and normal bowel sounds Musculoskeletal:no cyanosis of digits and no clubbing  NEURO: alert & oriented x 3 with fluent speech, no focal motor/sensory deficits Breasts: Breast inspection showed them to be symmetrical with no nipple discharge. Palpation of the breasts and axilla revealed no obvious mass that I could appreciate, except a mild scar tissue in the right breast at the previous incision.   LABORATORY DATA:  I have reviewed the data as listed    Latest Ref Rng & Units 12/17/2022    2:14 PM 06/18/2022    2:29 PM 06/02/2022    3:06 PM  CBC  WBC 4.0 - 10.5 K/uL 4.7  5.5  4.7   Hemoglobin 12.0 - 15.0 g/dL 13.7  13.5  13.7   Hematocrit 36.0 - 46.0 % 38.0  38.5  41.3   Platelets 150 - 400 K/uL 265  277  262         Latest Ref Rng & Units 12/17/2022    2:14 PM 06/18/2022    2:29 PM 06/02/2022    3:06 PM  CMP  Glucose 70 - 99 mg/dL 90  160  102   BUN 6 - 20 mg/dL '15  14   14   '$ Creatinine 0.44 - 1.00 mg/dL 0.75  0.85  0.76   Sodium 135 - 145 mmol/L 142  138  142   Potassium 3.5 - 5.1 mmol/L 4.3  3.5  3.9   Chloride 98 - 111 mmol/L 106  102  102   CO2 22 - 32 mmol/L '30  28  20   '$ Calcium 8.9 - 10.3 mg/dL 9.5  9.6  9.4   Total Protein 6.5 - 8.1 g/dL 7.1  6.7  6.5   Total Bilirubin 0.3 - 1.2 mg/dL 0.7  0.8  0.4   Alkaline Phos 38 - 126 U/L 74  58  91   AST 15 - 41 U/L 21  32  73   ALT 0 - 44 U/L 22  44  106       RADIOGRAPHIC STUDIES: I have personally reviewed the radiological images as listed and agreed with the findings in the report. No results found.    Orders Placed This Encounter  Procedures   [place] LIMITED ULTRASOUND INCLUDING AXILLA LEFT BREAST     Standing Status:   Future    Standing Expiration Date:   12/17/2023    Order Specific Question:   Reason for Exam (SYMPTOM  OR DIAGNOSIS REQUIRED)    Answer:   f/u left breast lipoma    Order Specific Question:   Preferred imaging location?    Answer:   [facility]   MM DIAG BREAST TOMO UNI LEFT    Standing Status:   Future    Standing Expiration Date:   12/17/2023    Order Specific Question:  Reason for Exam (SYMPTOM  OR DIAGNOSIS REQUIRED)    Answer:   f/u left breast lipoma    Order Specific Question:   Is the patient pregnant?    Answer:   No    Order Specific Question:   Preferred imaging location?    Answer:   [facility]   All questions were answered. The patient knows to call the clinic with any problems, questions or concerns. No barriers to learning was detected. The total time spent in the appointment was 25 minutes.     Truitt Merle, MD 12/17/2022

## 2022-12-20 ENCOUNTER — Other Ambulatory Visit (HOSPITAL_COMMUNITY): Payer: Self-pay

## 2022-12-21 ENCOUNTER — Other Ambulatory Visit: Payer: Self-pay

## 2022-12-23 ENCOUNTER — Other Ambulatory Visit: Payer: Self-pay

## 2022-12-23 ENCOUNTER — Other Ambulatory Visit (HOSPITAL_COMMUNITY): Payer: Self-pay

## 2022-12-29 ENCOUNTER — Telehealth: Payer: Self-pay

## 2022-12-29 NOTE — Telephone Encounter (Signed)
Faxed back signed orders to [facility] att: Erlene Quan as per Dr. Burr Medico for Mastectomy supplies. Received fax confirmation

## 2022-12-31 ENCOUNTER — Other Ambulatory Visit: Payer: Self-pay

## 2023-01-28 ENCOUNTER — Other Ambulatory Visit: Payer: Self-pay | Admitting: Family Medicine

## 2023-01-28 ENCOUNTER — Other Ambulatory Visit: Payer: Self-pay

## 2023-01-28 DIAGNOSIS — F900 Attention-deficit hyperactivity disorder, predominantly inattentive type: Secondary | ICD-10-CM

## 2023-01-28 MED ORDER — AMPHETAMINE-DEXTROAMPHET ER 25 MG PO CP24
25.0000 mg | ORAL_CAPSULE | Freq: Every morning | ORAL | 0 refills | Status: DC
  Filled 2023-01-28: qty 30, 30d supply, fill #0

## 2023-02-04 DIAGNOSIS — Z809 Family history of malignant neoplasm, unspecified: Secondary | ICD-10-CM | POA: Diagnosis not present

## 2023-02-04 DIAGNOSIS — Z170 Estrogen receptor positive status [ER+]: Secondary | ICD-10-CM | POA: Diagnosis not present

## 2023-02-04 DIAGNOSIS — C50411 Malignant neoplasm of upper-outer quadrant of right female breast: Secondary | ICD-10-CM | POA: Diagnosis not present

## 2023-03-02 ENCOUNTER — Other Ambulatory Visit: Payer: Self-pay | Admitting: Family Medicine

## 2023-03-02 ENCOUNTER — Other Ambulatory Visit (HOSPITAL_COMMUNITY): Payer: Self-pay

## 2023-03-02 DIAGNOSIS — F900 Attention-deficit hyperactivity disorder, predominantly inattentive type: Secondary | ICD-10-CM

## 2023-03-02 MED ORDER — AMPHETAMINE-DEXTROAMPHET ER 25 MG PO CP24
25.0000 mg | ORAL_CAPSULE | Freq: Every morning | ORAL | 0 refills | Status: DC
  Filled 2023-03-02 – 2023-03-18 (×2): qty 30, 30d supply, fill #0

## 2023-03-03 ENCOUNTER — Other Ambulatory Visit (HOSPITAL_COMMUNITY): Payer: Self-pay

## 2023-03-04 ENCOUNTER — Other Ambulatory Visit: Payer: Self-pay

## 2023-03-04 DIAGNOSIS — C50111 Malignant neoplasm of central portion of right female breast: Secondary | ICD-10-CM | POA: Diagnosis not present

## 2023-03-08 ENCOUNTER — Telehealth: Payer: Self-pay

## 2023-03-08 NOTE — Telephone Encounter (Signed)
Faxed signed DME orders to [facility] as per Dr. Mosetta Putt for mastectomy products. Received fax confirmation and placed original in the to be scanned file.

## 2023-03-10 ENCOUNTER — Other Ambulatory Visit: Payer: Self-pay

## 2023-03-14 ENCOUNTER — Other Ambulatory Visit: Payer: Self-pay | Admitting: *Deleted

## 2023-03-14 ENCOUNTER — Encounter: Payer: Self-pay | Admitting: Adult Health

## 2023-03-14 DIAGNOSIS — C50919 Malignant neoplasm of unspecified site of unspecified female breast: Secondary | ICD-10-CM

## 2023-03-14 DIAGNOSIS — C50111 Malignant neoplasm of central portion of right female breast: Secondary | ICD-10-CM

## 2023-03-16 ENCOUNTER — Encounter (HOSPITAL_COMMUNITY): Payer: Self-pay

## 2023-03-16 ENCOUNTER — Other Ambulatory Visit (HOSPITAL_COMMUNITY): Payer: Self-pay

## 2023-03-16 ENCOUNTER — Other Ambulatory Visit: Payer: Self-pay

## 2023-03-17 ENCOUNTER — Other Ambulatory Visit: Payer: Self-pay

## 2023-03-18 ENCOUNTER — Other Ambulatory Visit (HOSPITAL_COMMUNITY): Payer: Self-pay

## 2023-03-18 ENCOUNTER — Ambulatory Visit
Admission: RE | Admit: 2023-03-18 | Discharge: 2023-03-18 | Disposition: A | Discharge status: Home / Self Care | Payer: 59 | Source: Ambulatory Visit | Attending: Hematology | Admitting: Hematology

## 2023-03-18 ENCOUNTER — Telehealth: Payer: 59 | Admitting: Nurse Practitioner

## 2023-03-18 DIAGNOSIS — N63 Unspecified lump in unspecified breast: Secondary | ICD-10-CM | POA: Diagnosis not present

## 2023-03-18 DIAGNOSIS — R300 Dysuria: Secondary | ICD-10-CM | POA: Diagnosis not present

## 2023-03-18 DIAGNOSIS — C50111 Malignant neoplasm of central portion of right female breast: Secondary | ICD-10-CM

## 2023-03-18 DIAGNOSIS — D179 Benign lipomatous neoplasm, unspecified: Secondary | ICD-10-CM | POA: Diagnosis not present

## 2023-03-18 DIAGNOSIS — Z853 Personal history of malignant neoplasm of breast: Secondary | ICD-10-CM | POA: Diagnosis not present

## 2023-03-18 MED ORDER — CEPHALEXIN 500 MG PO CAPS: 500.0000 mg | ORAL_CAPSULE | Freq: Two times a day (BID) | ORAL | 0 refills | Status: AC

## 2023-03-18 NOTE — Progress Notes (Signed)

## 2023-04-08 ENCOUNTER — Other Ambulatory Visit: Payer: 59

## 2023-04-20 ENCOUNTER — Other Ambulatory Visit: Payer: Self-pay | Admitting: Oncology

## 2023-04-20 DIAGNOSIS — Z006 Encounter for examination for normal comparison and control in clinical research program: Secondary | ICD-10-CM

## 2023-04-25 ENCOUNTER — Encounter: Payer: Self-pay | Admitting: Family Medicine

## 2023-04-25 ENCOUNTER — Ambulatory Visit: Payer: 59 | Admitting: Family Medicine

## 2023-04-25 VITALS — BP 122/82 | HR 106 | Temp 98.8°F | Ht 64.5 in | Wt 148.8 lb

## 2023-04-25 DIAGNOSIS — N3001 Acute cystitis with hematuria: Secondary | ICD-10-CM

## 2023-04-25 LAB — POCT URINALYSIS DIP (CLINITEK)
Bilirubin, UA: NEGATIVE
Glucose, UA: NEGATIVE mg/dL
Ketones, POC UA: NEGATIVE mg/dL
Nitrite, UA: NEGATIVE
POC PROTEIN,UA: NEGATIVE
Spec Grav, UA: 1.025 (ref 1.010–1.025)
Urobilinogen, UA: NEGATIVE E.U./dL — AB
pH, UA: 6 (ref 5.0–8.0)

## 2023-04-25 MED ORDER — CEFTRIAXONE SODIUM 1 G IJ SOLR
1.0000 g | Freq: Once | INTRAMUSCULAR | Status: AC
  Administered 2023-04-25: 1 g via INTRAMUSCULAR

## 2023-04-25 MED ORDER — NITROFURANTOIN MONOHYD MACRO 100 MG PO CAPS: 100.0000 mg | ORAL_CAPSULE | Freq: Two times a day (BID) | ORAL | 0 refills | Status: DC

## 2023-04-25 NOTE — Progress Notes (Signed)
Acute Office Visit  Subjective:    Patient ID: Sheri Moore, female    DOB: Dec 08, 1980, 42 y.o.   MRN: [id number]  Chief Complaint  Patient presents with   Urinary Tract Infection    HPI: Patient is in today for UTI symptoms.  Patient had increased frequency and dysuria.  Denies nausea, vomiting, back pain, abdominal pain.  Patient is concerned she is having frequent urinary tract infection secondary to intercourse.  Past Medical History:  Diagnosis Date   Anxiety    Breast cancer (HCC) 07/06/21   Right Breast, first seen on mammogram done 07/06/21   Depression    Family history of breast cancer    Family history of melanoma    Family history of uterine cancer    Fibroid    Gastroschisis     Past Surgical History:  Procedure Laterality Date   ABDOMINAL HYSTERECTOMY     supracervical hysterectomy   ABDOMINAL SURGERY     as a newborn, gastrogheschesis   BREAST LUMPECTOMY Right 09/10/2021   BREAST LUMPECTOMY WITH RADIOACTIVE SEED AND SENTINEL LYMPH NODE BIOPSY Right 09/10/2021   Procedure: RIGHT BREAST LUMPECTOMY WITH RADIOACTIVE SEED AND SENTINEL LYMPH NODE BIOPSY;  Surgeon: Almond Lint, MD;  Location: [facility] OR;  Service: General;  Laterality: Right;   OOPHORECTOMY      Family History  Problem Relation Age of Onset   Uterine cancer Mother 66   Diabetes Mother    Hypertension Mother    Thyroid disease Mother    Obesity Mother    Subarachnoid hemorrhage Father    Melanoma Father 20   Diabetes Father    Diabetes Sister    Obesity Sister    Diabetes Maternal Grandmother    Hypertension Maternal Grandmother    Melanoma Paternal Grandfather 7   Diabetes Paternal Grandfather    Breast cancer Other 1       MGMs sister   Breast cancer Other 60       MGF's sister   Breast cancer Other        dx < 50; PGMs sister    Social History   Socioeconomic History   Marital status: Married    Spouse name: Not on file   Number of children: 1   Years of education:  College   Highest education level: Not on file  Occupational History   Occupation: [employer]  Tobacco Use   Smoking status: Former    Current packs/day: 0.00    Average packs/day: 0.3 packs/day for 3.0 years (0.8 ttl pk-yrs)    Types: Cigarettes    Start date: 10/11/1998    Quit date: 10/11/2001    Years since quitting: 21.5   Smokeless tobacco: Former  Building services engineer status: Never Used  Substance and Sexual Activity   Alcohol use: Not Currently    Comment: occ   Drug use: No   Sexual activity: Yes    Partners: Male    Birth control/protection: Surgical    Comment: supracervical hysterectomy, menarche 42yo, sexual debut 42yo  Other Topics Concern   Not on file  Social History Narrative   Lives with husband and son   Caffeine use: rarely   Right handed    Social Determinants of Health   Financial Resource Strain: Not on file  Food Insecurity: Not on file  Transportation Needs: Not on file  Physical Activity: Not on file  Stress: Not on file  Social Connections: Not on file  Intimate Partner  Violence: Not At Risk (09/08/2021)   Humiliation, Afraid, Rape, and Kick questionnaire    Fear of Current or Ex-Partner: No    Emotionally Abused: No    Physically Abused: No    Sexually Abused: No    Outpatient Medications Prior to Visit  Medication Sig Dispense Refill   amphetamine-dextroamphetamine (ADDERALL XR) 25 MG 24 hr capsule Take 1 capsule by mouth every morning. 30 capsule 0   buPROPion (WELLBUTRIN) 75 MG tablet Take 1 tablet (75 mg total) by mouth 2 (two) times daily. 180 tablet 1   fluticasone (FLONASE) 50 MCG/ACT nasal spray Place 2 sprays into both nostrils daily.     letrozole (FEMARA) 2.5 MG tablet Take 1 tablet (2.5 mg total) by mouth daily. 90 tablet 1   loratadine (CLARITIN) 10 MG tablet Take 10 mg by mouth daily.     LORazepam (ATIVAN) 0.5 MG tablet Take 1 tablet (0.5 mg total) by mouth every 8 (eight) hours. (Patient not taking: Reported on  12/03/2022) 30 tablet 1   amoxicillin (AMOXIL) 250 MG capsule Take 1 capsule (250 mg total) by mouth 2 (two) times daily. 10 capsule 0   Semaglutide-Weight Management 2.4 MG/0.75ML SOAJ Inject 2.4 mg into the skin once a week. 3 mL 5   sulfamethoxazole-trimethoprim (BACTRIM DS) 800-160 MG tablet Take 1 tablet by mouth 2 (two) times daily. 10 tablet 0   No facility-administered medications prior to visit.    No Known Allergies  Review of Systems  Constitutional:  Negative for appetite change, chills, fatigue and fever.  HENT:  Negative for congestion, ear pain, sinus pressure and sore throat.   Respiratory:  Negative for cough, chest tightness, shortness of breath and wheezing.   Cardiovascular:  Negative for chest pain and palpitations.  Gastrointestinal:  Negative for abdominal pain, constipation, diarrhea, nausea and vomiting.  Genitourinary:  Positive for dysuria and frequency. Negative for hematuria.  Musculoskeletal:  Negative for arthralgias, back pain, joint swelling and myalgias.  Skin:  Negative for rash.  Neurological:  Negative for dizziness, weakness and headaches.  Psychiatric/Behavioral:  Negative for dysphoric mood. The patient is not nervous/anxious.        Objective:        04/25/2023    2:05 PM 12/17/2022    2:30 PM 12/06/2022    7:39 AM  Vitals with BMI  Height 5' 4.5" 5' 4.5" 5' 4.5"  Weight 148 lbs 13 oz 145 lbs 2 oz 142 lbs  BMI 25.16 24.53 24.01  Systolic 122 137 161  Diastolic 82 89 74  Pulse 106 80 92    Orthostatic VS for the past 72 hrs (Last 3 readings):  Patient Position BP Location  04/25/23 1405 Sitting Left Arm     Physical Exam Vitals reviewed.  Constitutional:      Appearance: Normal appearance. She is normal weight.  Neck:     Vascular: No carotid bruit.  Cardiovascular:     Rate and Rhythm: Normal rate and regular rhythm.     Pulses: Normal pulses.     Heart sounds: Normal heart sounds.  Pulmonary:     Effort: Pulmonary effort is  normal. No respiratory distress.     Breath sounds: Normal breath sounds.  Abdominal:     General: Abdomen is flat. Bowel sounds are normal.     Palpations: Abdomen is soft.     Tenderness: There is no abdominal tenderness.  Neurological:     Mental Status: She is alert and oriented to person, place, and  time.  Psychiatric:        Mood and Affect: Mood normal.        Behavior: Behavior normal.     Health Maintenance Due  Topic Date Due   COVID-19 Vaccine (4 - 2023-24 season) 11/08/2022    There are no preventive care reminders to display for this patient.   Lab Results  Component Value Date   TSH 1.510 06/02/2022   Lab Results  Component Value Date   WBC 4.7 12/17/2022   HGB 13.7 12/17/2022   HCT 38.0 12/17/2022   MCV 81.2 12/17/2022   PLT 265 12/17/2022   Lab Results  Component Value Date   NA 142 12/17/2022   K 4.3 12/17/2022   CO2 30 12/17/2022   GLUCOSE 90 12/17/2022   BUN 15 12/17/2022   CREATININE 0.75 12/17/2022   BILITOT 0.7 12/17/2022   ALKPHOS 74 12/17/2022   AST 21 12/17/2022   ALT 22 12/17/2022   PROT 7.1 12/17/2022   ALBUMIN 4.6 12/17/2022   CALCIUM 9.5 12/17/2022   ANIONGAP 6 12/17/2022   EGFR 100 06/02/2022   Lab Results  Component Value Date   CHOL 217 (H) 06/02/2022   Lab Results  Component Value Date   HDL 51 06/02/2022   Lab Results  Component Value Date   LDLCALC 139 (H) 06/02/2022   Lab Results  Component Value Date   TRIG 151 (H) 06/02/2022   Lab Results  Component Value Date   CHOLHDL 4.3 06/02/2022   No results found for: "HGBA1C"     Assessment & Plan:  Acute cystitis with hematuria Assessment & Plan: Prescription for Macrobid 100 mg twice daily for 1 week and then go to 1 at time of intercourse. Urine culture sent. Rocephin 1 g given as patient cannot get her antibiotic till later tonight.  Orders: -     Nitrofurantoin Monohyd Macro; Take 1 capsule (100 mg total) by mouth 2 (two) times daily.  Dispense: 60  capsule; Refill: 0 -     cefTRIAXone Sodium -     POCT URINALYSIS DIP (CLINITEK) -     Urine Culture     Meds ordered this encounter  Medications   nitrofurantoin, macrocrystal-monohydrate, (MACROBID) 100 MG capsule    Sig: Take 1 capsule (100 mg total) by mouth 2 (two) times daily.    Dispense:  60 capsule    Refill:  0   cefTRIAXone (ROCEPHIN) injection 1 g    Orders Placed This Encounter  Procedures   Urine Culture   POCT URINALYSIS DIP (CLINITEK)     Follow-up: No follow-ups on file.  An After Visit Summary was printed and given to the patient.    I,Lauren M Auman,acting as a scribe for Blane Ohara, MD.,have documented all relevant documentation on the behalf of Blane Ohara, MD,as directed by  Blane Ohara, MD while in the presence of Blane Ohara, MD.    Blane Ohara, MD [facility] [phone]

## 2023-04-25 NOTE — Assessment & Plan Note (Signed)
Prescription for Macrobid 100 mg twice daily for 1 week and then go to 1 at time of intercourse. Urine culture sent. Rocephin 1 g given as patient cannot get her antibiotic till later tonight.

## 2023-04-27 LAB — URINE CULTURE

## 2023-05-12 ENCOUNTER — Other Ambulatory Visit (HOSPITAL_COMMUNITY): Payer: Self-pay

## 2023-05-12 ENCOUNTER — Other Ambulatory Visit: Payer: Self-pay | Admitting: Family Medicine

## 2023-05-12 DIAGNOSIS — F900 Attention-deficit hyperactivity disorder, predominantly inattentive type: Secondary | ICD-10-CM

## 2023-05-12 MED ORDER — AMPHETAMINE-DEXTROAMPHET ER 25 MG PO CP24
25.0000 mg | ORAL_CAPSULE | Freq: Every morning | ORAL | 0 refills | Status: DC
  Filled 2023-05-12: qty 30, 30d supply, fill #0

## 2023-05-13 ENCOUNTER — Other Ambulatory Visit (HOSPITAL_COMMUNITY): Payer: Self-pay

## 2023-05-17 ENCOUNTER — Other Ambulatory Visit (HOSPITAL_COMMUNITY): Payer: Self-pay

## 2023-05-18 ENCOUNTER — Other Ambulatory Visit: Payer: Self-pay

## 2023-05-20 ENCOUNTER — Other Ambulatory Visit: Payer: Self-pay

## 2023-05-25 ENCOUNTER — Ambulatory Visit: Payer: 59 | Admitting: Family Medicine

## 2023-05-25 ENCOUNTER — Other Ambulatory Visit: Payer: Self-pay | Admitting: Family Medicine

## 2023-05-25 ENCOUNTER — Encounter: Payer: Self-pay | Admitting: Family Medicine

## 2023-05-25 VITALS — BP 132/92 | HR 86 | Temp 98.0°F | Resp 12 | Ht 64.5 in | Wt 157.0 lb

## 2023-05-25 DIAGNOSIS — R002 Palpitations: Secondary | ICD-10-CM

## 2023-05-25 DIAGNOSIS — E782 Mixed hyperlipidemia: Secondary | ICD-10-CM

## 2023-05-25 MED ORDER — METOPROLOL SUCCINATE ER 25 MG PO TB24: 25.0000 mg | ORAL_TABLET | Freq: Every day | ORAL | 3 refills | Status: DC

## 2023-05-25 NOTE — Progress Notes (Signed)
Acute Office Visit  Subjective:    Patient ID: Sheri Moore, female    DOB: 07/26/1981, 42 y.o.   MRN: [id number]  Chief Complaint  Patient presents with   Palpitations    HPI: Patient is in today for increase hear rate last night and she could not sleep. She denies SOB, chest pain, tinnitus, headache. Usually at rest. Off adderall for one week because it was unavailable at the pharmacy.  Patient is unsure of her attention has changed.  Past Medical History:  Diagnosis Date   Anxiety    Breast cancer (HCC) 07/06/21   Right Breast, first seen on mammogram done 07/06/21   Depression    Family history of breast cancer    Family history of melanoma    Family history of uterine cancer    Fibroid    Gastroschisis     Past Surgical History:  Procedure Laterality Date   ABDOMINAL HYSTERECTOMY     supracervical hysterectomy   ABDOMINAL SURGERY     as a newborn, gastrogheschesis   BREAST LUMPECTOMY Right 09/10/2021   BREAST LUMPECTOMY WITH RADIOACTIVE SEED AND SENTINEL LYMPH NODE BIOPSY Right 09/10/2021   Procedure: RIGHT BREAST LUMPECTOMY WITH RADIOACTIVE SEED AND SENTINEL LYMPH NODE BIOPSY;  Surgeon: Almond Lint, MD;  Location: [facility] OR;  Service: General;  Laterality: Right;   OOPHORECTOMY      Family History  Problem Relation Age of Onset   Uterine cancer Mother 43   Diabetes Mother    Hypertension Mother    Thyroid disease Mother    Obesity Mother    Subarachnoid hemorrhage Father    Melanoma Father 86   Diabetes Father    Diabetes Sister    Obesity Sister    Diabetes Maternal Grandmother    Hypertension Maternal Grandmother    Melanoma Paternal Grandfather 98   Diabetes Paternal Grandfather    Breast cancer Other 88       MGMs sister   Breast cancer Other 21       MGF's sister   Breast cancer Other        dx < 50; PGMs sister    Social History   Socioeconomic History   Marital status: Married    Spouse name: Not on file   Number of children: 1    Years of education: College   Highest education level: Not on file  Occupational History   Occupation: [employer]  Tobacco Use   Smoking status: Former    Current packs/day: 0.00    Average packs/day: 0.3 packs/day for 3.0 years (0.8 ttl pk-yrs)    Types: Cigarettes    Start date: 10/11/1998    Quit date: 10/11/2001    Years since quitting: 21.6   Smokeless tobacco: Former  Building services engineer status: Never Used  Substance and Sexual Activity   Alcohol use: Not Currently    Comment: occ   Drug use: No   Sexual activity: Yes    Partners: Male    Birth control/protection: Surgical    Comment: supracervical hysterectomy, menarche 42yo, sexual debut 42yo  Other Topics Concern   Not on file  Social History Narrative   Lives with husband and son   Caffeine use: rarely   Right handed    Social Determinants of Health   Financial Resource Strain: Not on file  Food Insecurity: Not on file  Transportation Needs: Not on file  Physical Activity: Not on file  Stress: Not on file  Social Connections: Not on file  Intimate Partner Violence: Not At Risk (09/08/2021)   Humiliation, Afraid, Rape, and Kick questionnaire    Fear of Current or Ex-Partner: No    Emotionally Abused: No    Physically Abused: No    Sexually Abused: No    Outpatient Medications Prior to Visit  Medication Sig Dispense Refill   amphetamine-dextroamphetamine (ADDERALL XR) 25 MG 24 hr capsule Take 1 capsule by mouth every morning. 30 capsule 0   buPROPion (WELLBUTRIN) 75 MG tablet Take 1 tablet (75 mg total) by mouth 2 (two) times daily. 180 tablet 1   fluticasone (FLONASE) 50 MCG/ACT nasal spray Place 2 sprays into both nostrils daily.     letrozole (FEMARA) 2.5 MG tablet Take 1 tablet (2.5 mg total) by mouth daily. 90 tablet 1   loratadine (CLARITIN) 10 MG tablet Take 10 mg by mouth daily.     LORazepam (ATIVAN) 0.5 MG tablet Take 1 tablet (0.5 mg total) by mouth every 8 (eight) hours. 30 tablet 1    nitrofurantoin, macrocrystal-monohydrate, (MACROBID) 100 MG capsule Take 1 capsule (100 mg total) by mouth 2 (two) times daily. 60 capsule 0   No facility-administered medications prior to visit.    No Known Allergies  Review of Systems  Constitutional:  Negative for chills, fatigue and fever.  HENT:  Negative for congestion, ear pain and sore throat.   Respiratory:  Negative for cough and shortness of breath.   Cardiovascular:  Positive for palpitations. Negative for chest pain.  Gastrointestinal:  Negative for abdominal pain, constipation, diarrhea, nausea and vomiting.  Endocrine: Negative for polydipsia, polyphagia and polyuria.  Genitourinary:  Negative for difficulty urinating and dysuria.  Musculoskeletal:  Negative for arthralgias, back pain and myalgias.  Skin:  Negative for rash.  Neurological:  Negative for headaches.  Psychiatric/Behavioral:  Positive for sleep disturbance. Negative for dysphoric mood. The patient is not nervous/anxious.        Objective:        05/25/2023   12:00 PM 05/25/2023   11:36 AM 04/25/2023    2:05 PM  Vitals with BMI  Height  5' 4.5" 5' 4.5"  Weight  157 lbs 148 lbs 13 oz  BMI  26.54 25.16  Systolic 132 130 323  Diastolic 92 100 82  Pulse  86 557    No data found.   Physical Exam Vitals reviewed.  Constitutional:      Appearance: Normal appearance. She is normal weight.  Cardiovascular:     Rate and Rhythm: Normal rate and regular rhythm.     Heart sounds: Normal heart sounds.  Pulmonary:     Effort: Pulmonary effort is normal. No respiratory distress.     Breath sounds: Normal breath sounds.  Neurological:     Mental Status: She is alert and oriented to person, place, and time.  Psychiatric:        Mood and Affect: Mood normal.        Behavior: Behavior normal.     Health Maintenance Due  Topic Date Due   COVID-19 Vaccine (4 - 2023-24 season) 11/08/2022   INFLUENZA VACCINE  05/12/2023    There are no preventive care  reminders to display for this patient.   Lab Results  Component Value Date   TSH 2.690 05/25/2023   Lab Results  Component Value Date   WBC 4.8 05/25/2023   HGB 13.3 05/25/2023   HCT 39.3 05/25/2023   MCV 85 05/25/2023   PLT 256 05/25/2023  Lab Results  Component Value Date   NA 143 05/25/2023   K 3.9 05/25/2023   CO2 25 05/25/2023   GLUCOSE 87 05/25/2023   BUN 12 05/25/2023   CREATININE 0.64 05/25/2023   BILITOT 0.4 05/25/2023   ALKPHOS 121 05/25/2023   AST 24 05/25/2023   ALT 28 05/25/2023   PROT 6.4 05/25/2023   ALBUMIN 4.5 05/25/2023   CALCIUM 9.5 05/25/2023   ANIONGAP 6 12/17/2022   EGFR 112 05/25/2023   Lab Results  Component Value Date   CHOL 219 (H) 05/25/2023   Lab Results  Component Value Date   HDL 61 05/25/2023   Lab Results  Component Value Date   LDLCALC 139 (H) 05/25/2023   Lab Results  Component Value Date   TRIG 106 05/25/2023   Lab Results  Component Value Date   CHOLHDL 3.6 05/25/2023   No results found for: "HGBA1C"     Assessment & Plan:  Palpitations Assessment & Plan: Check EKG Order Heart Monitor long term Sent Metoprolol.  Orders: -     EKG 12-Lead -     LONG TERM MONITOR (3-14 DAYS); Future -     Metoprolol Succinate ER; Take 1 tablet (25 mg total) by mouth daily.  Dispense: 90 tablet; Refill: 3     Meds ordered this encounter  Medications   metoprolol succinate (TOPROL-XL) 25 MG 24 hr tablet    Sig: Take 1 tablet (25 mg total) by mouth daily.    Dispense:  90 tablet    Refill:  3    Orders Placed This Encounter  Procedures   LONG TERM MONITOR (3-14 DAYS)   EKG 12-Lead     Follow-up: No follow-ups on file.  An After Visit Summary was printed and given to the patient.   Clayborn Bigness I Leal-Borjas,acting as a scribe for Blane Ohara, MD.,have documented all relevant documentation on the behalf of Blane Ohara, MD,as directed by  Blane Ohara, MD while in the presence of Blane Ohara, MD.   Blane Ohara, MD [facility] [phone]

## 2023-05-26 ENCOUNTER — Ambulatory Visit: Payer: 59 | Attending: Family Medicine

## 2023-05-26 DIAGNOSIS — R002 Palpitations: Secondary | ICD-10-CM

## 2023-05-26 LAB — LIPID PANEL
Chol/HDL Ratio: 3.6 ratio (ref 0.0–4.4)
Cholesterol, Total: 219 mg/dL — ABNORMAL HIGH (ref 100–199)
HDL: 61 mg/dL (ref >39)
LDL Chol Calc (NIH): 139 mg/dL — ABNORMAL HIGH (ref 0–99)
Triglycerides: 106 mg/dL (ref 0–149)
VLDL Cholesterol Cal: 19 mg/dL (ref 5–40)

## 2023-05-26 LAB — CBC WITH DIFFERENTIAL/PLATELET
Basophils Absolute: 0 10*3/uL (ref 0.0–0.2)
Basos: 1 %
EOS (ABSOLUTE): 0.1 10*3/uL (ref 0.0–0.4)
Eos: 2 %
Hematocrit: 39.3 % (ref 34.0–46.6)
Hemoglobin: 13.3 g/dL (ref 11.1–15.9)
Immature Grans (Abs): 0 10*3/uL (ref 0.0–0.1)
Immature Granulocytes: 0 %
Lymphocytes Absolute: 1.4 10*3/uL (ref 0.7–3.1)
Lymphs: 30 %
MCH: 28.9 pg (ref 26.6–33.0)
MCHC: 33.8 g/dL (ref 31.5–35.7)
MCV: 85 fL (ref 79–97)
Monocytes Absolute: 0.3 10*3/uL (ref 0.1–0.9)
Monocytes: 7 %
Neutrophils Absolute: 2.9 10*3/uL (ref 1.4–7.0)
Neutrophils: 60 %
Platelets: 256 10*3/uL (ref 150–450)
RBC: 4.61 x10E6/uL (ref 3.77–5.28)
RDW: 13.3 % (ref 11.7–15.4)
WBC: 4.8 10*3/uL (ref 3.4–10.8)

## 2023-05-26 LAB — COMPREHENSIVE METABOLIC PANEL
ALT: 28 IU/L (ref 0–32)
AST: 24 IU/L (ref 0–40)
Albumin: 4.5 g/dL (ref 3.9–4.9)
Alkaline Phosphatase: 121 IU/L (ref 44–121)
BUN/Creatinine Ratio: 19 (ref 9–23)
BUN: 12 mg/dL (ref 6–24)
Bilirubin Total: 0.4 mg/dL (ref 0.0–1.2)
CO2: 25 mmol/L (ref 20–29)
Calcium: 9.5 mg/dL (ref 8.7–10.2)
Chloride: 102 mmol/L (ref 96–106)
Creatinine, Ser: 0.64 mg/dL (ref 0.57–1.00)
Globulin, Total: 1.9 g/dL (ref 1.5–4.5)
Glucose: 87 mg/dL (ref 70–99)
Potassium: 3.9 mmol/L (ref 3.5–5.2)
Sodium: 143 mmol/L (ref 134–144)
Total Protein: 6.4 g/dL (ref 6.0–8.5)
eGFR: 112 mL/min/{1.73_m2} (ref >59)

## 2023-05-26 LAB — T4, FREE: Free T4: 1.16 ng/dL (ref 0.82–1.77)

## 2023-05-26 LAB — MAGNESIUM: Magnesium: 2 mg/dL (ref 1.6–2.3)

## 2023-05-26 LAB — TSH: TSH: 2.69 u[IU]/mL (ref 0.450–4.500)

## 2023-05-29 DIAGNOSIS — R002 Palpitations: Secondary | ICD-10-CM | POA: Insufficient documentation

## 2023-05-29 NOTE — Assessment & Plan Note (Signed)
Check EKG Order Heart Monitor long term Sent Metoprolol.

## 2023-05-30 ENCOUNTER — Other Ambulatory Visit (HOSPITAL_COMMUNITY): Payer: Self-pay

## 2023-05-30 ENCOUNTER — Other Ambulatory Visit: Payer: Self-pay

## 2023-05-31 ENCOUNTER — Other Ambulatory Visit (HOSPITAL_COMMUNITY): Payer: Self-pay

## 2023-05-31 ENCOUNTER — Other Ambulatory Visit: Payer: Self-pay

## 2023-05-31 MED ORDER — ROSUVASTATIN CALCIUM 5 MG PO TABS
5.0000 mg | ORAL_TABLET | Freq: Every day | ORAL | 1 refills | Status: DC
  Filled 2023-05-31: qty 90, 90d supply, fill #0
  Filled 2023-09-06: qty 90, 90d supply, fill #1

## 2023-06-02 ENCOUNTER — Other Ambulatory Visit (HOSPITAL_COMMUNITY): Payer: Self-pay

## 2023-06-02 ENCOUNTER — Other Ambulatory Visit: Payer: Self-pay

## 2023-06-02 DIAGNOSIS — F19981 Other psychoactive substance use, unspecified with psychoactive substance-induced sexual dysfunction: Secondary | ICD-10-CM

## 2023-06-02 DIAGNOSIS — C50411 Malignant neoplasm of upper-outer quadrant of right female breast: Secondary | ICD-10-CM

## 2023-06-02 MED ORDER — BUPROPION HCL 75 MG PO TABS
75.0000 mg | ORAL_TABLET | Freq: Two times a day (BID) | ORAL | 1 refills | Status: DC
  Filled 2023-06-02: qty 180, 90d supply, fill #0
  Filled 2023-09-18: qty 180, 90d supply, fill #1
  Filled 2023-09-19: qty 180, 90d supply, fill #0

## 2023-06-03 ENCOUNTER — Encounter: Payer: Self-pay | Admitting: Family Medicine

## 2023-06-03 ENCOUNTER — Other Ambulatory Visit: Payer: Self-pay

## 2023-06-03 ENCOUNTER — Other Ambulatory Visit (HOSPITAL_COMMUNITY): Payer: Self-pay

## 2023-06-03 ENCOUNTER — Ambulatory Visit (INDEPENDENT_AMBULATORY_CARE_PROVIDER_SITE_OTHER): Payer: 59 | Admitting: Family Medicine

## 2023-06-03 VITALS — BP 132/78 | HR 81 | Temp 97.3°F | Ht 64.5 in | Wt 150.0 lb

## 2023-06-03 DIAGNOSIS — H469 Unspecified optic neuritis: Secondary | ICD-10-CM

## 2023-06-03 DIAGNOSIS — F988 Other specified behavioral and emotional disorders with onset usually occurring in childhood and adolescence: Secondary | ICD-10-CM

## 2023-06-03 DIAGNOSIS — G4485 Primary stabbing headache: Secondary | ICD-10-CM | POA: Diagnosis not present

## 2023-06-03 MED ORDER — ATOMOXETINE HCL 40 MG PO CAPS
40.0000 mg | ORAL_CAPSULE | Freq: Every day | ORAL | 0 refills | Status: DC
Start: 2023-06-03 — End: 2023-07-05
  Filled 2023-06-03: qty 30, 30d supply, fill #0

## 2023-06-03 MED ORDER — TOPIRAMATE 25 MG PO TABS
ORAL_TABLET | ORAL | 0 refills | Status: DC
  Filled 2023-06-03: qty 49, 28d supply, fill #0

## 2023-06-03 NOTE — Progress Notes (Unsigned)
Subjective:  Patient ID: Sheri Moore, female    DOB: 04/27/1981  Age: 42 y.o. MRN: [id number]  Chief Complaint  Patient presents with   Medical Management of Chronic Issues    HPI Left eye sharp pain brief 4 times a day. Seconds. No migraines. Present x 2 years.   Pharmacy was out of adderall. Patient's attention has been worsening.       06/03/2023   10:27 AM 06/02/2022    2:21 PM 02/19/2022   11:37 AM 11/16/2021    1:59 PM 04/20/2021    1:24 PM  Depression screen PHQ 2/9  Decreased Interest 0 0 0 3 0  Down, Depressed, Hopeless 0 0 0 3 0  PHQ - 2 Score 0 0 0 6 0  Altered sleeping 0 0 3 2 1   Tired, decreased energy 1 1 3 2 3   Change in appetite  0 0 1 0  Feeling bad or failure about yourself  0 0 0 2 0  Trouble concentrating 2 1 3 2 2   Moving slowly or fidgety/restless 0 0 0 1 0  Suicidal thoughts 0 0 0 0 0  PHQ-9 Score 3 2 9 16 6   Difficult doing work/chores  Not difficult at all Very difficult Somewhat difficult Somewhat difficult        06/03/2023   10:26 AM  Fall Risk   Falls in the past year? 0  Number falls in past yr: 0  Injury with Fall? 0  Risk for fall due to : No Fall Risks  Follow up Falls evaluation completed    Patient Care Team: Blane Ohara, MD as PCP - General (Family Medicine) Malachy Mood, MD as Consulting Physician (Hematology) Almond Lint, MD as Consulting Physician (General Surgery) Dorothy Puffer, MD as Consulting Physician (Radiation Oncology)   Review of Systems  Constitutional:  Negative for appetite change, fatigue and fever.  HENT:  Negative for congestion, ear pain, sinus pressure and sore throat.   Respiratory:  Negative for cough, chest tightness, shortness of breath and wheezing.   Cardiovascular:  Negative for chest pain and palpitations.  Gastrointestinal:  Negative for abdominal pain, constipation, diarrhea, nausea and vomiting.  Genitourinary:  Negative for dysuria and hematuria.  Musculoskeletal:  Negative for arthralgias,  back pain, joint swelling and myalgias.  Skin:  Negative for rash.  Neurological:  Negative for dizziness, weakness and headaches.  Psychiatric/Behavioral:  Negative for dysphoric mood. The patient is not nervous/anxious.     Current Outpatient Medications on File Prior to Visit  Medication Sig Dispense Refill   buPROPion (WELLBUTRIN) 75 MG tablet Take 1 tablet (75 mg total) by mouth 2 (two) times daily. 180 tablet 1   fluticasone (FLONASE) 50 MCG/ACT nasal spray Place 2 sprays into both nostrils daily.     letrozole (FEMARA) 2.5 MG tablet Take 1 tablet (2.5 mg total) by mouth daily. 90 tablet 1   loratadine (CLARITIN) 10 MG tablet Take 10 mg by mouth daily.     LORazepam (ATIVAN) 0.5 MG tablet Take 1 tablet (0.5 mg total) by mouth every 8 (eight) hours. 30 tablet 1   metoprolol succinate (TOPROL-XL) 25 MG 24 hr tablet Take 1 tablet (25 mg total) by mouth daily. 90 tablet 3   nitrofurantoin, macrocrystal-monohydrate, (MACROBID) 100 MG capsule Take 1 capsule (100 mg total) by mouth 2 (two) times daily. 60 capsule 0   rosuvastatin (CRESTOR) 5 MG tablet Take 1 tablet (5 mg total) by mouth daily. 90 tablet 1   No  current facility-administered medications on file prior to visit.   Past Medical History:  Diagnosis Date   Anxiety    Breast cancer (HCC) 07/06/21   Right Breast, first seen on mammogram done 07/06/21   Depression    Family history of breast cancer    Family history of melanoma    Family history of uterine cancer    Fibroid    Gastroschisis    Left arm numbness 10/17/2018   Left arm weakness 10/17/2018   Past Surgical History:  Procedure Laterality Date   ABDOMINAL HYSTERECTOMY     supracervical hysterectomy   ABDOMINAL SURGERY     as a newborn, gastrogheschesis   BREAST LUMPECTOMY Right 09/10/2021   BREAST LUMPECTOMY WITH RADIOACTIVE SEED AND SENTINEL LYMPH NODE BIOPSY Right 09/10/2021   Procedure: RIGHT BREAST LUMPECTOMY WITH RADIOACTIVE SEED AND SENTINEL LYMPH NODE  BIOPSY;  Surgeon: Almond Lint, MD;  Location: [facility] OR;  Service: General;  Laterality: Right;   OOPHORECTOMY      Family History  Problem Relation Age of Onset   Uterine cancer Mother 58   Diabetes Mother    Hypertension Mother    Thyroid disease Mother    Obesity Mother    Subarachnoid hemorrhage Father    Melanoma Father 12   Diabetes Father    Diabetes Sister    Obesity Sister    Diabetes Maternal Grandmother    Hypertension Maternal Grandmother    Melanoma Paternal Grandfather 35   Diabetes Paternal Grandfather    Breast cancer Other 11       MGMs sister   Breast cancer Other 70       MGF's sister   Breast cancer Other        dx < 50; PGMs sister   Social History   Socioeconomic History   Marital status: Married    Spouse name: Not on file   Number of children: 1   Years of education: College   Highest education level: Not on file  Occupational History   Occupation: [name]  Tobacco Use   Smoking status: Former    Current packs/day: 0.00    Average packs/day: 0.3 packs/day for 3.0 years (0.8 ttl pk-yrs)    Types: Cigarettes    Start date: 10/11/1998    Quit date: 10/11/2001    Years since quitting: 21.6   Smokeless tobacco: Former  Building services engineer status: Never Used  Substance and Sexual Activity   Alcohol use: Not Currently    Comment: occ   Drug use: No   Sexual activity: Yes    Partners: Male    Birth control/protection: Surgical    Comment: supracervical hysterectomy, menarche 42yo, sexual debut 42yo  Other Topics Concern   Not on file  Social History Narrative   Lives with husband and son   Caffeine use: rarely   Right handed    Social Determinants of Health   Financial Resource Strain: Not on file  Food Insecurity: Not on file  Transportation Needs: Not on file  Physical Activity: Not on file  Stress: Not on file  Social Connections: Not on file    Objective:  BP 132/78 (BP Location: Left Arm, Patient Position: Sitting)    Pulse 81   Temp (!) 97.3 F (36.3 C) (Temporal)   Ht 5' 4.5" (1.638 m)   Wt 150 lb (68 kg)   SpO2 99%   BMI 25.35 kg/m      06/03/2023   10:22 AM 05/25/2023  12:00 PM 05/25/2023   11:36 AM  BP/Weight  Systolic BP 132 132 130  Diastolic BP 78 92 100  Wt. (Lbs) 150  157  BMI 25.35 kg/m2  26.53 kg/m2    Physical Exam Vitals reviewed.  Constitutional:      Appearance: Normal appearance. She is normal weight.  HENT:     Nose:     Comments: No sinus tenderness.  Unable to elicit headache with pressure or light touch Cardiovascular:     Rate and Rhythm: Normal rate and regular rhythm.     Heart sounds: Normal heart sounds.  Pulmonary:     Effort: Pulmonary effort is normal.     Breath sounds: Normal breath sounds.  Abdominal:     General: Abdomen is flat. Bowel sounds are normal.     Palpations: Abdomen is soft.  Neurological:     Mental Status: She is alert and oriented to person, place, and time.  Psychiatric:        Mood and Affect: Mood normal.        Behavior: Behavior normal.     Diabetic Foot Exam - Simple   No data filed      Lab Results  Component Value Date   WBC 4.8 05/25/2023   HGB 13.3 05/25/2023   HCT 39.3 05/25/2023   PLT 256 05/25/2023   GLUCOSE 87 05/25/2023   CHOL 219 (H) 05/25/2023   TRIG 106 05/25/2023   HDL 61 05/25/2023   LDLCALC 139 (H) 05/25/2023   ALT 28 05/25/2023   AST 24 05/25/2023   NA 143 05/25/2023   K 3.9 05/25/2023   CL 102 05/25/2023   CREATININE 0.64 05/25/2023   BUN 12 05/25/2023   CO2 25 05/25/2023   TSH 2.690 05/25/2023      Assessment & Plan:    Attention deficit disorder (ADD) without hyperactivity Assessment & Plan: Stop adderall Start Strattera 40 mg daily.  Orders: -     Atomoxetine HCl; Take 1 capsule (40 mg total) by mouth daily.  Dispense: 30 capsule; Refill: 0  Primary stabbing headache Assessment & Plan: Sent Topamax  Orders: -     Topiramate; Take 1 tablet (25 mg total) by mouth at  bedtime for 7 days, THEN 2 tablets (50 mg total) at bedtime for 21 days.  Dispense: 49 tablet; Refill: 0     Meds ordered this encounter  Medications   topiramate (TOPAMAX) 25 MG tablet    Sig: Take 1 tablet (25 mg total) by mouth at bedtime for 7 days, THEN 2 tablets (50 mg total) at bedtime for 21 days.    Dispense:  49 tablet    Refill:  0   atomoxetine (STRATTERA) 40 MG capsule    Sig: Take 1 capsule (40 mg total) by mouth daily.    Dispense:  30 capsule    Refill:  0    No orders of the defined types were placed in this encounter.    Follow-up: Return if symptoms worsen or fail to improve.   I,Lauren M Auman,acting as a scribe for Blane Ohara, MD.,have documented all relevant documentation on the behalf of Blane Ohara, MD,as directed by  Blane Ohara, MD while in the presence of Blane Ohara, MD.   Clayborn Bigness I Leal-Borjas,acting as a scribe for Blane Ohara, MD.,have documented all relevant documentation on the behalf of Blane Ohara, MD,as directed by  Blane Ohara, MD while in the presence of Blane Ohara, MD.    An After Visit Summary  was printed and given to the patient.  Blane Ohara, MD [facility] [phone]

## 2023-06-03 NOTE — Progress Notes (Unsigned)
Acute Office Visit  Subjective:    Patient ID: Sheri Moore, female    DOB: [birth year], 42 y.o.   MRN: [id number]  No chief complaint on file.   HPI: Patient is in today for ***  Past Medical History:  Diagnosis Date   Anxiety    Breast cancer (HCC) 07/06/21   Right Breast, first seen on mammogram done 07/06/21   Depression    Family history of breast cancer    Family history of melanoma    Family history of uterine cancer    Fibroid    Gastroschisis     Past Surgical History:  Procedure Laterality Date   ABDOMINAL HYSTERECTOMY     supracervical hysterectomy   ABDOMINAL SURGERY     as a newborn, gastrogheschesis   BREAST LUMPECTOMY Right 09/10/2021   BREAST LUMPECTOMY WITH RADIOACTIVE SEED AND SENTINEL LYMPH NODE BIOPSY Right 09/10/2021   Procedure: RIGHT BREAST LUMPECTOMY WITH RADIOACTIVE SEED AND SENTINEL LYMPH NODE BIOPSY;  Surgeon: Almond Lint, MD;  Location: [facility] OR;  Service: General;  Laterality: Right;   OOPHORECTOMY      Family History  Problem Relation Age of Onset   Uterine cancer Mother 65   Diabetes Mother    Hypertension Mother    Thyroid disease Mother    Obesity Mother    Subarachnoid hemorrhage Father    Melanoma Father 48   Diabetes Father    Diabetes Sister    Obesity Sister    Diabetes Maternal Grandmother    Hypertension Maternal Grandmother    Melanoma Paternal Grandfather 28   Diabetes Paternal Grandfather    Breast cancer Other 73       MGMs sister   Breast cancer Other 31       MGF's sister   Breast cancer Other        dx < 50; PGMs sister    Social History   Socioeconomic History   Marital status: Married    Spouse name: Not on file   Number of children: 1   Years of education: College   Highest education level: Not on file  Occupational History   Occupation: [employer]  Tobacco Use   Smoking status: Former    Current packs/day: 0.00    Average packs/day: 0.3 packs/day for 3.0 years (0.8 ttl pk-yrs)     Types: Cigarettes    Start date: 10/11/1998    Quit date: 10/11/2001    Years since quitting: 21.6   Smokeless tobacco: Former  Building services engineer status: Never Used  Substance and Sexual Activity   Alcohol use: Not Currently    Comment: occ   Drug use: No   Sexual activity: Yes    Partners: Male    Birth control/protection: Surgical    Comment: supracervical hysterectomy, menarche 42yo, sexual debut 42yo  Other Topics Concern   Not on file  Social History Narrative   Lives with husband and son   Caffeine use: rarely   Right handed    Social Determinants of Health   Financial Resource Strain: Not on file  Food Insecurity: Not on file  Transportation Needs: Not on file  Physical Activity: Not on file  Stress: Not on file  Social Connections: Not on file  Intimate Partner Violence: Not At Risk (09/08/2021)   Humiliation, Afraid, Rape, and Kick questionnaire    Fear of Current or Ex-Partner: No    Emotionally Abused: No    Physically Abused: No  Sexually Abused: No    Outpatient Medications Prior to Visit  Medication Sig Dispense Refill   amphetamine-dextroamphetamine (ADDERALL XR) 25 MG 24 hr capsule Take 1 capsule by mouth every morning. 30 capsule 0   buPROPion (WELLBUTRIN) 75 MG tablet Take 1 tablet (75 mg total) by mouth 2 (two) times daily. 180 tablet 1   fluticasone (FLONASE) 50 MCG/ACT nasal spray Place 2 sprays into both nostrils daily.     letrozole (FEMARA) 2.5 MG tablet Take 1 tablet (2.5 mg total) by mouth daily. 90 tablet 1   loratadine (CLARITIN) 10 MG tablet Take 10 mg by mouth daily.     LORazepam (ATIVAN) 0.5 MG tablet Take 1 tablet (0.5 mg total) by mouth every 8 (eight) hours. 30 tablet 1   metoprolol succinate (TOPROL-XL) 25 MG 24 hr tablet Take 1 tablet (25 mg total) by mouth daily. 90 tablet 3   nitrofurantoin, macrocrystal-monohydrate, (MACROBID) 100 MG capsule Take 1 capsule (100 mg total) by mouth 2 (two) times daily. 60 capsule 0   rosuvastatin  (CRESTOR) 5 MG tablet Take 1 tablet (5 mg total) by mouth daily. 90 tablet 1   No facility-administered medications prior to visit.    No Known Allergies  Review of Systems     Objective:        05/25/2023   12:00 PM 05/25/2023   11:36 AM 04/25/2023    2:05 PM  Vitals with BMI  Height  5' 4.5" 5' 4.5"  Weight  157 lbs 148 lbs 13 oz  BMI  26.54 25.16  Systolic 132 130 295  Diastolic 92 100 82  Pulse  86 284    No data found.   Physical Exam  Health Maintenance Due  Topic Date Due   COVID-19 Vaccine (4 - 2023-24 season) 11/08/2022   INFLUENZA VACCINE  05/12/2023    There are no preventive care reminders to display for this patient.   Lab Results  Component Value Date   TSH 2.690 05/25/2023   Lab Results  Component Value Date   WBC 4.8 05/25/2023   HGB 13.3 05/25/2023   HCT 39.3 05/25/2023   MCV 85 05/25/2023   PLT 256 05/25/2023   Lab Results  Component Value Date   NA 143 05/25/2023   K 3.9 05/25/2023   CO2 25 05/25/2023   GLUCOSE 87 05/25/2023   BUN 12 05/25/2023   CREATININE 0.64 05/25/2023   BILITOT 0.4 05/25/2023   ALKPHOS 121 05/25/2023   AST 24 05/25/2023   ALT 28 05/25/2023   PROT 6.4 05/25/2023   ALBUMIN 4.5 05/25/2023   CALCIUM 9.5 05/25/2023   ANIONGAP 6 12/17/2022   EGFR 112 05/25/2023   Lab Results  Component Value Date   CHOL 219 (H) 05/25/2023   Lab Results  Component Value Date   HDL 61 05/25/2023   Lab Results  Component Value Date   LDLCALC 139 (H) 05/25/2023   Lab Results  Component Value Date   TRIG 106 05/25/2023   Lab Results  Component Value Date   CHOLHDL 3.6 05/25/2023   No results found for: "HGBA1C"     Assessment & Plan:  There are no diagnoses linked to this encounter.   No orders of the defined types were placed in this encounter.   No orders of the defined types were placed in this encounter.    Follow-up: No follow-ups on file.  An After Visit Summary was printed and given to the  patient.  Blane Ohara, MD [facility] [phone]

## 2023-06-04 ENCOUNTER — Encounter: Payer: Self-pay | Admitting: Family Medicine

## 2023-06-04 NOTE — Assessment & Plan Note (Signed)
Stop adderall Start Strattera 40 mg daily.

## 2023-06-04 NOTE — Assessment & Plan Note (Signed)
Sent Topamax

## 2023-06-08 ENCOUNTER — Encounter: Payer: Self-pay | Admitting: Family Medicine

## 2023-06-08 ENCOUNTER — Other Ambulatory Visit: Payer: Self-pay | Admitting: Family Medicine

## 2023-06-08 ENCOUNTER — Other Ambulatory Visit: Payer: Self-pay

## 2023-06-08 ENCOUNTER — Other Ambulatory Visit (HOSPITAL_COMMUNITY): Payer: Self-pay

## 2023-06-08 MED ORDER — LISDEXAMFETAMINE DIMESYLATE 30 MG PO CAPS
30.0000 mg | ORAL_CAPSULE | ORAL | 0 refills | Status: DC
  Filled 2023-06-08 – 2023-06-09 (×2): qty 30, 30d supply, fill #0

## 2023-06-09 ENCOUNTER — Other Ambulatory Visit (HOSPITAL_COMMUNITY): Payer: Self-pay

## 2023-06-10 ENCOUNTER — Telehealth: Payer: Self-pay | Admitting: Adult Health

## 2023-06-10 NOTE — Telephone Encounter (Signed)
Rescheduled appointments per provider PAL. Patient is aware of the changes made to her upcoming appointments.

## 2023-06-15 DIAGNOSIS — R002 Palpitations: Secondary | ICD-10-CM | POA: Diagnosis not present

## 2023-06-17 ENCOUNTER — Inpatient Hospital Stay: Payer: 59 | Admitting: Adult Health

## 2023-06-17 ENCOUNTER — Inpatient Hospital Stay: Payer: 59

## 2023-06-22 ENCOUNTER — Other Ambulatory Visit: Payer: Self-pay | Admitting: Family Medicine

## 2023-06-22 ENCOUNTER — Other Ambulatory Visit (HOSPITAL_COMMUNITY): Payer: Self-pay

## 2023-06-22 MED ORDER — GABAPENTIN 300 MG PO CAPS
300.0000 mg | ORAL_CAPSULE | Freq: Three times a day (TID) | ORAL | 0 refills | Status: DC
  Filled 2023-06-22: qty 90, 30d supply, fill #0

## 2023-06-23 ENCOUNTER — Other Ambulatory Visit (HOSPITAL_COMMUNITY): Payer: Self-pay

## 2023-07-05 ENCOUNTER — Inpatient Hospital Stay: Payer: 59 | Attending: Hematology

## 2023-07-05 ENCOUNTER — Inpatient Hospital Stay (HOSPITAL_BASED_OUTPATIENT_CLINIC_OR_DEPARTMENT_OTHER): Payer: 59 | Admitting: Adult Health

## 2023-07-05 ENCOUNTER — Encounter: Payer: Self-pay | Admitting: Adult Health

## 2023-07-05 VITALS — BP 121/75 | HR 84 | Temp 98.1°F | Resp 16 | Wt 162.2 lb

## 2023-07-05 DIAGNOSIS — Z87891 Personal history of nicotine dependence: Secondary | ICD-10-CM | POA: Diagnosis not present

## 2023-07-05 DIAGNOSIS — Z803 Family history of malignant neoplasm of breast: Secondary | ICD-10-CM | POA: Diagnosis not present

## 2023-07-05 DIAGNOSIS — C50111 Malignant neoplasm of central portion of right female breast: Secondary | ICD-10-CM | POA: Insufficient documentation

## 2023-07-05 DIAGNOSIS — Z79811 Long term (current) use of aromatase inhibitors: Secondary | ICD-10-CM

## 2023-07-05 DIAGNOSIS — Z170 Estrogen receptor positive status [ER+]: Secondary | ICD-10-CM | POA: Diagnosis not present

## 2023-07-05 DIAGNOSIS — Z5181 Encounter for therapeutic drug level monitoring: Secondary | ICD-10-CM

## 2023-07-05 DIAGNOSIS — Z808 Family history of malignant neoplasm of other organs or systems: Secondary | ICD-10-CM | POA: Insufficient documentation

## 2023-07-05 LAB — CMP (CANCER CENTER ONLY)
ALT: 32 U/L (ref 0–44)
AST: 27 U/L (ref 15–41)
Albumin: 4.3 g/dL (ref 3.5–5.0)
Alkaline Phosphatase: 92 U/L (ref 38–126)
Anion gap: 8 (ref 5–15)
BUN: 14 mg/dL (ref 6–20)
CO2: 24 mmol/L (ref 22–32)
Calcium: 9.2 mg/dL (ref 8.9–10.3)
Chloride: 110 mmol/L (ref 98–111)
Creatinine: 0.9 mg/dL (ref 0.44–1.00)
GFR, Estimated: >60 mL/min (ref >60)
Glucose, Bld: 156 mg/dL — ABNORMAL HIGH (ref 70–99)
Potassium: 3.6 mmol/L (ref 3.5–5.1)
Sodium: 142 mmol/L (ref 135–145)
Total Bilirubin: 0.6 mg/dL (ref 0.3–1.2)
Total Protein: 6.7 g/dL (ref 6.5–8.1)

## 2023-07-05 LAB — CBC WITH DIFFERENTIAL (CANCER CENTER ONLY)
Abs Immature Granulocytes: 0.01 10*3/uL (ref 0.00–0.07)
Basophils Absolute: 0 10*3/uL (ref 0.0–0.1)
Basophils Relative: 1 %
Eosinophils Absolute: 0.1 10*3/uL (ref 0.0–0.5)
Eosinophils Relative: 1 %
HCT: 36.8 % (ref 36.0–46.0)
Hemoglobin: 13.1 g/dL (ref 12.0–15.0)
Immature Granulocytes: 0 %
Lymphocytes Relative: 30 %
Lymphs Abs: 1.3 10*3/uL (ref 0.7–4.0)
MCH: 29.4 pg (ref 26.0–34.0)
MCHC: 35.6 g/dL (ref 30.0–36.0)
MCV: 82.5 fL (ref 80.0–100.0)
Monocytes Absolute: 0.2 10*3/uL (ref 0.1–1.0)
Monocytes Relative: 4 %
Neutro Abs: 2.8 10*3/uL (ref 1.7–7.7)
Neutrophils Relative %: 64 %
Platelet Count: 242 10*3/uL (ref 150–400)
RBC: 4.46 MIL/uL (ref 3.87–5.11)
RDW: 12.7 % (ref 11.5–15.5)
WBC Count: 4.4 10*3/uL (ref 4.0–10.5)
nRBC: 0 % (ref 0.0–0.2)

## 2023-07-05 NOTE — Progress Notes (Unsigned)
[facility] Cancer Follow up:    Sheri Ohara, MD [address] [place] [address]   DIAGNOSIS:  Cancer Staging  Cancer of central portion of right breast [facility]) Staging form: Breast, AJCC 8th Edition - Clinical stage from 08/04/2021: Stage IA (cT1b, cN0, cM0, G2, ER+, PR+, HER2-) - Unsigned Stage prefix: Initial diagnosis Histologic grading system: 3 grade system - Pathologic stage from 09/10/2021: Stage IA (pT1c, pN0, cM0, G2, ER+, PR+, HER2-, Oncotype DX score: 22) - Signed by Malachy Mood, MD on 10/01/2021 Stage prefix: Initial diagnosis Multigene prognostic tests performed: Oncotype DX Recurrence score range: Greater than or equal to 11 Histologic grading system: 3 grade system Residual tumor (R): R0 - None   SUMMARY OF ONCOLOGIC HISTORY: Oncology History Overview Note   Cancer Staging  Cancer of central portion of right breast [facility]) Staging form: Breast, AJCC 8th Edition - Clinical stage from 08/04/2021: Stage IA (cT1b, cN0, cM0, G2, ER+, PR+, HER2-) - Unsigned - Pathologic stage from 09/10/2021: Stage IA (pT1c, pN0, cM0, G2, ER+, PR+, HER2-, Oncotype DX score: 22) - Signed by Malachy Mood, MD on 10/01/2021     Cancer of central portion of right breast (HCC)  07/29/2021 Mammogram   EXAM: DIGITAL DIAGNOSTIC UNILATERAL RIGHT MAMMOGRAM WITH TOMOSYNTHESIS AND CAD; ULTRASOUND RIGHT BREAST LIMITED  IMPRESSION: Suspicious mass in the right breast at 9 o'clock measuring 1.0 cm. Targeted ultrasound performed in the right axilla demonstrating normal lymph nodes.   08/04/2021 Initial Biopsy   Diagnosis Breast, right, needle core biopsy, 9 o'clock, ribbon shaped clip - INVASIVE DUCTAL CARCINOMA - DUCTAL CARCINOMA IN SITU - SEE COMMENT Microscopic Comment Based on the biopsy, the carcinoma appears Nottingham grade 2 of 3 and measures 0.9 cm in greatest linear extent.  PROGNOSTIC INDICATORS Results: The tumor cells are EQUIVOCAL for Her2 (2+). Her2 by  FISH will be performed and results reported separately. Estrogen Receptor: 100%, POSITIVE, STRONG STAINING INTENSITY Progesterone Receptor: 95%, POSITIVE, STRONG STAINING INTENSITY Proliferation Marker Ki67: 10%  FLUORESCENCE IN-SITU HYBRIDIZATION Results: GROUP 5: HER2 **NEGATIVE**   09/01/2021 Initial Diagnosis   Cancer of central portion of right breast (HCC)   09/08/2021 Genetic Testing   Negative genetic testing on the BRCAPlus panel.  The report date is 08/29/2021.  Negative genetic testing on the CancerNext-Expanded+RNAinsight.  DICER1 p.T43M VUS was identified. The report date is 09/08/2021.  The BRCAPlus gene panel offered by [facility] and includes sequencing and rearrangement analysis for the following 6 genes: BRCA1, BRCA2, CDH1, PALB2, PTEN, and TP53.  The CancerNext-Expanded gene panel offered by [facility] and includes sequencing and rearrangement analysis for the following 77 genes: AIP, ALK, APC*, ATM*, AXIN2, BAP1, BARD1, BLM, BMPR1A, BRCA1*, BRCA2*, BRIP1*, CDC73, CDH1*, CDK4, CDKN1B, CDKN2A, CHEK2*, CTNNA1, DICER1, FANCC, FH, FLCN, GALNT12, KIF1B, LZTR1, MAX, MEN1, MET, MLH1*, MSH2*, MSH3, MSH6*, MUTYH*, NBN, NF1*, NF2, NTHL1, PALB2*, PHOX2B, PMS2*, POT1, PRKAR1A, PTCH1, PTEN*, RAD51C*, RAD51D*, RB1, RECQL, RET, SDHA, SDHAF2, SDHB, SDHC, SDHD, SMAD4, SMARCA4, SMARCB1, SMARCE1, STK11, SUFU, TMEM127, TP53*, TSC1, TSC2, VHL and XRCC2 (sequencing and deletion/duplication); EGFR, EGLN1, HOXB13, KIT, MITF, PDGFRA, POLD1, and POLE (sequencing only); EPCAM and GREM1 (deletion/duplication only). DNA and RNA analyses performed for * genes.    09/10/2021 Cancer Staging   Staging form: Breast, AJCC 8th Edition - Pathologic stage from 09/10/2021: Stage IA (pT1c, pN0, cM0, G2, ER+, PR+, HER2-, Oncotype DX score: 22) - Signed by Malachy Mood, MD on 10/01/2021 Stage prefix: Initial diagnosis Multigene prognostic tests performed: Oncotype DX  Recurrence score range: Greater than or equal  to 11 Histologic grading system: 3 grade system Residual tumor (R): R0 - None   09/10/2021 Definitive Surgery   FINAL MICROSCOPIC DIAGNOSIS:  A. BREAST, RIGHT, LUMPECTOMY:  Invasive ductal carcinoma with clear margins of resection.  INVASIVE CARCINOMA OF THE BREAST:  Resection  Procedure: Lumpectomy.  Specimen Laterality: Right.  Histologic Type: Invasive ductal carcinoma.  Histologic Grade:       Glandular (Acinar)/Tubular Differentiation: 3 out of possible 3.       Nuclear Pleomorphism: 2 out of possible 3.       Mitotic Rate: 1 out of possible 3.       Overall Grade: 2 out of possible 3 (from a score of 6 out of  possible 9)  Tumor Size: 1.2 cm. x 1.0 cm. x 0.8 cm.  Ductal Carcinoma In Situ: Present. Small foci without comedo necrosis.  Tumor Extent: Limited to breast parenchyma.  Treatment Effect in the Breast: No known presurgical therapy  Margins: All margins are negative for invasive carcinoma.       Distance from Closest Margin (mm): 7 mm.       Specify Closest Margin (required only if <8mm): Inferior.  DCIS Margins: Uninvolved by DCIS.       Distance from Closest Margin (mm): 6 mm.       Specify Closest Margin (required only if <59mm): Posterior.  Regional Lymph Nodes:       Number of Lymph Nodes Examined: Five (5).       Number of Sentinel Nodes Examined: Five (5).       Number of Lymph Nodes with Macrometastases (>2 mm): None (0).       Number of Lymph Nodes with Micrometastases: None (0).       Number of Lymph Nodes with Isolated Tumor Cells (=0.2 mm or =200 cells): None (0).       Size of Largest Metastatic Deposit (mm): N.A.       Extranodal Extension: N.A.  Distant Metastasis:       Distant Site(s) Involved: None known.  Breast Biomarker Testing Performed on Previous Biopsy:       Testing Performed on Case Number: [id number] from 08/04/2021.             Estrogen Receptor: 100% (Strong intensity).             Progesterone Receptor: 95% (Strong intensity).              HER2: 2+ by IHC / Negative by FISH.             Ki-67: 10%  Pathologic Stage Classification (pTNM, AJCC 8th Edition): pT1c, pN0  Representative Tumor Block: A3 and A2.  Comment(s): None.   B. LYMPH NODE, RIGHT (5), SENTINEL, EXCISION:  5 lymph nodes with fatty infiltration which is negative for metastatic  Carcinoma. (0/5)    11/03/2021 - 12/17/2021 Radiation Therapy   Site Technique Total Dose (Gy) Dose per Fx (Gy) Completed Fx Beam Energies  Breast, Right: Breast_R 3D 50.4/50.4 1.8 28/28 6X  Breast, Right: Breast_R_Bst 3D 10/10 2 5/5 6X     12/2021 -  Anti-estrogen oral therapy   Letrozole x 7-10 years     CURRENT THERAPY: Letrozole  INTERVAL HISTORY: Sheri Moore 42 y.o. female returns for f/u of her breast cancer.  She continues on Letrozole daily with good tolerance.  She denies any new breast concerns.    Her most recent mammogram occurred on July 30, 2022 demonstrating no mammographic evidence of malignancy, breast density category B, and bilateral diagnostic mammogram was recommended to occur in October 2024.  She is not exercising regularly and eats about two servings of fruits and vegetables per day.  She underwent bone density testing in 02/2022 that was normal.     Patient Active Problem List   Diagnosis Date Noted   Palpitations 05/29/2023   Acute pyelonephritis 12/06/2022   Optic neuritis, left 04/22/2022   Cancer of central portion of right breast (HCC) 09/01/2021   Genetic testing 08/31/2021   Family history of breast cancer 08/19/2021   Family history of melanoma 08/19/2021   Family history of uterine cancer 08/19/2021   Malignant neoplasm of upper-outer quadrant of right breast in female, estrogen receptor positive (HCC) 08/17/2021   Cognitive changes 04/29/2021   Attention deficit disorder (ADD) without hyperactivity 01/09/2020   GAD (generalized anxiety disorder) 01/09/2020   Somnolence, daytime 01/09/2020   Facial numbness 12/12/2018    White matter abnormality on MRI of brain 11/09/2018   Other fatigue 11/09/2018   Depression with anxiety 11/09/2018   Sprain of anterior talofibular ligament of right ankle 05/08/2017    has No Known Allergies.  MEDICAL HISTORY: Past Medical History:  Diagnosis Date   Anxiety    Breast cancer (HCC) 07/06/21   Right Breast, first seen on mammogram done 07/06/21   Depression    Family history of breast cancer    Family history of melanoma    Family history of uterine cancer    Fibroid    Gastroschisis    Left arm numbness 10/17/2018   Left arm weakness 10/17/2018    SURGICAL HISTORY: Past Surgical History:  Procedure Laterality Date   ABDOMINAL HYSTERECTOMY     supracervical hysterectomy   ABDOMINAL SURGERY     as a newborn, gastrogheschesis   BREAST LUMPECTOMY Right 09/10/2021   BREAST LUMPECTOMY WITH RADIOACTIVE SEED AND SENTINEL LYMPH NODE BIOPSY Right 09/10/2021   Procedure: RIGHT BREAST LUMPECTOMY WITH RADIOACTIVE SEED AND SENTINEL LYMPH NODE BIOPSY;  Surgeon: Almond Lint, MD;  Location: [facility] OR;  Service: General;  Laterality: Right;   OOPHORECTOMY      SOCIAL HISTORY: Social History   Socioeconomic History   Marital status: Married    Spouse name: Not on file   Number of children: 1   Years of education: College   Highest education level: Not on file  Occupational History   Occupation: [employer]  Tobacco Use   Smoking status: Former    Current packs/day: 0.00    Average packs/day: 0.3 packs/day for 3.0 years (0.8 ttl pk-yrs)    Types: Cigarettes    Start date: 10/11/1998    Quit date: 10/11/2001    Years since quitting: 21.7   Smokeless tobacco: Former  Building services engineer status: Never Used  Substance and Sexual Activity   Alcohol use: Not Currently    Comment: occ   Drug use: No   Sexual activity: Yes    Partners: Male    Birth control/protection: Surgical    Comment: supracervical hysterectomy, menarche 42yo, sexual debut 42yo  Other  Topics Concern   Not on file  Social History Narrative   Lives with husband and son   Caffeine use: rarely   Right handed    Social Determinants of Health   Financial Resource Strain: Not on file  Food Insecurity: Not on file  Transportation Needs: Not on file  Physical Activity: Not on file  Stress:  Not on file  Social Connections: Not on file  Intimate Partner Violence: Not At Risk (09/08/2021)   Humiliation, Afraid, Rape, and Kick questionnaire    Fear of Current or Ex-Partner: No    Emotionally Abused: No    Physically Abused: No    Sexually Abused: No    FAMILY HISTORY: Family History  Problem Relation Age of Onset   Uterine cancer Mother 61   Diabetes Mother    Hypertension Mother    Thyroid disease Mother    Obesity Mother    Subarachnoid hemorrhage Father    Melanoma Father 34   Diabetes Father    Diabetes Sister    Obesity Sister    Diabetes Maternal Grandmother    Hypertension Maternal Grandmother    Melanoma Paternal Grandfather 27   Diabetes Paternal Grandfather    Breast cancer Other 17       MGMs sister   Breast cancer Other 75       MGF's sister   Breast cancer Other        dx < 50; PGMs sister    Review of Systems  Constitutional:  Negative for appetite change, chills, fatigue, fever and unexpected weight change.  HENT:   Negative for hearing loss, lump/mass and trouble swallowing.   Eyes:  Negative for eye problems and icterus.  Respiratory:  Negative for chest tightness, cough and shortness of breath.   Cardiovascular:  Negative for chest pain, leg swelling and palpitations.  Gastrointestinal:  Negative for abdominal distention, abdominal pain, constipation, diarrhea, nausea and vomiting.  Endocrine: Negative for hot flashes.  Genitourinary:  Negative for difficulty urinating.   Musculoskeletal:  Negative for arthralgias.  Skin:  Negative for itching and rash.  Neurological:  Negative for dizziness, extremity weakness, headaches and  numbness.  Hematological:  Negative for adenopathy. Does not bruise/bleed easily.  Psychiatric/Behavioral:  Negative for depression. The patient is not nervous/anxious.       PHYSICAL EXAMINATION   Onc Performance Status - 07/05/23 1430       ECOG Perf Status   ECOG Perf Status Fully active, able to carry on all pre-disease performance without restriction      KPS SCALE   KPS % SCORE Normal, no compliants, no evidence of disease             Vitals:   07/05/23 1425  BP: 121/75  Pulse: 84  Resp: 16  Temp: 98.1 F (36.7 C)  SpO2: 99%    Physical Exam Constitutional:      General: She is not in acute distress.    Appearance: Normal appearance. She is not toxic-appearing.  HENT:     Head: Normocephalic and atraumatic.     Mouth/Throat:     Mouth: Mucous membranes are moist.     Pharynx: Oropharynx is clear. No oropharyngeal exudate or posterior oropharyngeal erythema.  Eyes:     General: No scleral icterus. Cardiovascular:     Rate and Rhythm: Normal rate and regular rhythm.     Pulses: Normal pulses.     Heart sounds: Normal heart sounds.  Pulmonary:     Effort: Pulmonary effort is normal.     Breath sounds: Normal breath sounds.  Chest:     Comments: Right breast s/p lumpectomy and radiation, no sign of local recurrence, left breast benign Abdominal:     General: Abdomen is flat. Bowel sounds are normal. There is no distension.     Palpations: Abdomen is soft.     Tenderness:  There is no abdominal tenderness.  Musculoskeletal:        General: No swelling.     Cervical back: Neck supple.  Lymphadenopathy:     Cervical: No cervical adenopathy.  Skin:    General: Skin is warm and dry.     Findings: No rash.  Neurological:     General: No focal deficit present.     Mental Status: She is alert.  Psychiatric:        Mood and Affect: Mood normal.        Behavior: Behavior normal.     LABORATORY DATA:  CBC    Component Value Date/Time   WBC 4.4  07/05/2023 1333   WBC 7.5 09/01/2021 1330   RBC 4.46 07/05/2023 1333   HGB 13.1 07/05/2023 1333   HGB 13.3 05/25/2023 0900   HCT 36.8 07/05/2023 1333   HCT 39.3 05/25/2023 0900   PLT 242 07/05/2023 1333   PLT 256 05/25/2023 0900   MCV 82.5 07/05/2023 1333   MCV 85 05/25/2023 0900   MCH 29.4 07/05/2023 1333   MCHC 35.6 07/05/2023 1333   RDW 12.7 07/05/2023 1333   RDW 13.3 05/25/2023 0900   LYMPHSABS 1.3 07/05/2023 1333   LYMPHSABS 1.4 05/25/2023 0900   MONOABS 0.2 07/05/2023 1333   EOSABS 0.1 07/05/2023 1333   EOSABS 0.1 05/25/2023 0900   BASOSABS 0.0 07/05/2023 1333   BASOSABS 0.0 05/25/2023 0900    CMP     Component Value Date/Time   NA 142 07/05/2023 1333   NA 143 05/25/2023 0900   K 3.6 07/05/2023 1333   CL 110 07/05/2023 1333   CO2 24 07/05/2023 1333   GLUCOSE 156 (H) 07/05/2023 1333   BUN 14 07/05/2023 1333   BUN 12 05/25/2023 0900   CREATININE 0.90 07/05/2023 1333   CALCIUM 9.2 07/05/2023 1333   PROT 6.7 07/05/2023 1333   PROT 6.4 05/25/2023 0900   ALBUMIN 4.3 07/05/2023 1333   ALBUMIN 4.5 05/25/2023 0900   AST 27 07/05/2023 1333   ALT 32 07/05/2023 1333   ALKPHOS 92 07/05/2023 1333   BILITOT 0.6 07/05/2023 1333   GFRNONAA >60 07/05/2023 1333   GFRAA 104 09/30/2020 1556           ASSESSMENT and THERAPY PLAN:   Cancer of central portion of right breast [facility]) Sheri Moore is a 42 year old woman with history of stage Ia right breast invasive ductal carcinoma, ER PR positive diagnosed in October 2022 status postlumpectomy, adjuvant radiation, and antiestrogen therapy with letrozole which began in March 2023.  Of note patient is status post bilateral oophorectomy that happened during her late 58s to early 44s.  Stage Ia right breast invasive ductal carcinoma: She has no clinical or radiographic signs of breast cancer recurrence.  She will continue on letrozole daily.  Her next mammogram is due in October 2024.  This should be a bilateral diagnostic mammogram with  a left breast ultrasound to follow-up on a previous lipoma.   Bone health: Her most recent bone density testing in May 2023 was normal.  I placed repeat bone density testing to be completed in May 2025.  We discussed calcium, vitamin D, weightbearing exercises today. Health maintenance: We reviewed the importance of healthy diet and exercise including plenty of fruits and vegetables along with exercising 150 to 300 minutes a week.  I gave him some handouts from the [employer] of lifestyle medicine about dietary and exercise recommendations.  Sheri Moore will see Dr. Lucius Conn in 6 months and  we will see her back in 1 year for labs and follow-up.  She knows to call for any questions or concerns that may arise between now and her next visit with [place].    All questions were answered. The patient knows to call the clinic with any problems, questions or concerns. We can certainly see the patient much sooner if necessary.  Total encounter time:30 minutes*in face-to-face visit time, chart review, lab review, care coordination, order entry, and documentation of the encounter time.    Lillard Anes, NP 07/05/23 3:26 PM Medical Oncology and Hematology [facility] [address] [place], [place] [address] Tel. [phone]    Fax. [phone]  *Total Encounter Time as defined by the Centers for Medicare and Medicaid Services includes, in addition to the face-to-face time of a patient visit (documented in the note above) non-face-to-face time: obtaining and reviewing outside history, ordering and reviewing medications, tests or procedures, care coordination (communications with other health care professionals or caregivers) and documentation in the medical record.

## 2023-07-05 NOTE — Assessment & Plan Note (Signed)
Sheri Moore is a 42 year old woman with history of stage Ia right breast invasive ductal carcinoma, ER PR positive diagnosed in October 2022 status postlumpectomy, adjuvant radiation, and antiestrogen therapy with letrozole which began in March 2023.  Of note patient is status post bilateral oophorectomy that happened during her late 75s to early 66s.  Stage Ia right breast invasive ductal carcinoma: She has no clinical or radiographic signs of breast cancer recurrence.  She will continue on letrozole daily.  Her next mammogram is due in October 2024.  This should be a bilateral diagnostic mammogram with a left breast ultrasound to follow-up on a previous lipoma.   Bone health: Her most recent bone density testing in May 2023 was normal.  I placed repeat bone density testing to be completed in May 2025.  We discussed calcium, vitamin D, weightbearing exercises today. Health maintenance: We reviewed the importance of healthy diet and exercise including plenty of fruits and vegetables along with exercising 150 to 300 minutes a week.  I gave him some handouts from the [employer] of lifestyle medicine about dietary and exercise recommendations.  Sheri Moore will see Dr. Lucius Conn in 6 months and we will see her back in 1 year for labs and follow-up.  She knows to call for any questions or concerns that may arise between now and her next visit with [place].

## 2023-07-06 ENCOUNTER — Encounter: Payer: Self-pay | Admitting: Adult Health

## 2023-07-11 ENCOUNTER — Other Ambulatory Visit: Payer: Self-pay | Admitting: Family Medicine

## 2023-07-11 ENCOUNTER — Other Ambulatory Visit (HOSPITAL_COMMUNITY): Payer: Self-pay

## 2023-07-11 MED ORDER — LISDEXAMFETAMINE DIMESYLATE 30 MG PO CAPS
30.0000 mg | ORAL_CAPSULE | ORAL | 0 refills | Status: DC
  Filled 2023-07-11: qty 30, 30d supply, fill #0

## 2023-07-12 ENCOUNTER — Other Ambulatory Visit (HOSPITAL_COMMUNITY): Payer: Self-pay

## 2023-07-12 ENCOUNTER — Other Ambulatory Visit: Payer: Self-pay

## 2023-07-12 DIAGNOSIS — R002 Palpitations: Secondary | ICD-10-CM

## 2023-07-12 MED ORDER — METOPROLOL SUCCINATE ER 25 MG PO TB24
25.0000 mg | ORAL_TABLET | Freq: Every day | ORAL | 1 refills | Status: DC
  Filled 2023-07-12: qty 90, 90d supply, fill #0
  Filled 2023-09-06: qty 90, 90d supply, fill #1

## 2023-07-13 ENCOUNTER — Other Ambulatory Visit: Payer: Self-pay

## 2023-07-22 ENCOUNTER — Ambulatory Visit (INDEPENDENT_AMBULATORY_CARE_PROVIDER_SITE_OTHER): Payer: 59

## 2023-07-22 DIAGNOSIS — Z23 Encounter for immunization: Secondary | ICD-10-CM

## 2023-08-05 ENCOUNTER — Ambulatory Visit: Payer: 59

## 2023-08-05 ENCOUNTER — Ambulatory Visit
Admission: RE | Admit: 2023-08-05 | Discharge: 2023-08-05 | Disposition: A | Discharge status: Home / Self Care | Payer: 59 | Source: Ambulatory Visit | Attending: Family Medicine | Admitting: Family Medicine

## 2023-08-05 DIAGNOSIS — Z853 Personal history of malignant neoplasm of breast: Secondary | ICD-10-CM | POA: Diagnosis not present

## 2023-08-05 DIAGNOSIS — N6323 Unspecified lump in the left breast, lower outer quadrant: Secondary | ICD-10-CM

## 2023-08-14 ENCOUNTER — Other Ambulatory Visit: Payer: Self-pay | Admitting: Family Medicine

## 2023-08-14 ENCOUNTER — Other Ambulatory Visit (HOSPITAL_BASED_OUTPATIENT_CLINIC_OR_DEPARTMENT_OTHER): Payer: Self-pay

## 2023-08-15 ENCOUNTER — Other Ambulatory Visit (HOSPITAL_BASED_OUTPATIENT_CLINIC_OR_DEPARTMENT_OTHER): Payer: Self-pay

## 2023-08-15 ENCOUNTER — Other Ambulatory Visit (HOSPITAL_COMMUNITY): Payer: Self-pay

## 2023-08-15 ENCOUNTER — Other Ambulatory Visit: Payer: Self-pay

## 2023-08-15 MED ORDER — LISDEXAMFETAMINE DIMESYLATE 30 MG PO CAPS
30.0000 mg | ORAL_CAPSULE | ORAL | 0 refills | Status: DC
Start: 2023-08-15 — End: 2023-09-18
  Filled 2023-08-15 (×2): qty 30, 30d supply, fill #0

## 2023-08-16 ENCOUNTER — Other Ambulatory Visit: Payer: Self-pay

## 2023-09-06 ENCOUNTER — Other Ambulatory Visit (HOSPITAL_BASED_OUTPATIENT_CLINIC_OR_DEPARTMENT_OTHER): Payer: Self-pay

## 2023-09-06 ENCOUNTER — Other Ambulatory Visit: Payer: Self-pay | Admitting: Family Medicine

## 2023-09-06 MED ORDER — GABAPENTIN 300 MG PO CAPS
300.0000 mg | ORAL_CAPSULE | Freq: Three times a day (TID) | ORAL | 0 refills | Status: DC
  Filled 2023-09-06: qty 90, 30d supply, fill #0

## 2023-09-18 ENCOUNTER — Other Ambulatory Visit: Payer: Self-pay

## 2023-09-19 ENCOUNTER — Other Ambulatory Visit (HOSPITAL_BASED_OUTPATIENT_CLINIC_OR_DEPARTMENT_OTHER): Payer: Self-pay

## 2023-09-19 MED ORDER — LISDEXAMFETAMINE DIMESYLATE 30 MG PO CAPS
30.0000 mg | ORAL_CAPSULE | ORAL | 0 refills | Status: DC
  Filled 2023-09-19: qty 30, 30d supply, fill #0

## 2023-09-20 ENCOUNTER — Other Ambulatory Visit (HOSPITAL_BASED_OUTPATIENT_CLINIC_OR_DEPARTMENT_OTHER): Payer: Self-pay

## 2023-10-07 ENCOUNTER — Ambulatory Visit (INDEPENDENT_AMBULATORY_CARE_PROVIDER_SITE_OTHER): Payer: 59 | Admitting: Family Medicine

## 2023-10-07 ENCOUNTER — Other Ambulatory Visit: Payer: Self-pay | Admitting: Family Medicine

## 2023-10-07 ENCOUNTER — Other Ambulatory Visit (HOSPITAL_BASED_OUTPATIENT_CLINIC_OR_DEPARTMENT_OTHER): Payer: Self-pay

## 2023-10-07 VITALS — BP 122/84 | HR 90 | Temp 97.3°F | Resp 18 | Ht 64.5 in | Wt 169.0 lb

## 2023-10-07 DIAGNOSIS — C50411 Malignant neoplasm of upper-outer quadrant of right female breast: Secondary | ICD-10-CM

## 2023-10-07 DIAGNOSIS — J0100 Acute maxillary sinusitis, unspecified: Secondary | ICD-10-CM | POA: Insufficient documentation

## 2023-10-07 DIAGNOSIS — F988 Other specified behavioral and emotional disorders with onset usually occurring in childhood and adolescence: Secondary | ICD-10-CM

## 2023-10-07 DIAGNOSIS — R000 Tachycardia, unspecified: Secondary | ICD-10-CM | POA: Insufficient documentation

## 2023-10-07 DIAGNOSIS — Z170 Estrogen receptor positive status [ER+]: Secondary | ICD-10-CM

## 2023-10-07 DIAGNOSIS — F330 Major depressive disorder, recurrent, mild: Secondary | ICD-10-CM | POA: Insufficient documentation

## 2023-10-07 DIAGNOSIS — R002 Palpitations: Secondary | ICD-10-CM

## 2023-10-07 DIAGNOSIS — E782 Mixed hyperlipidemia: Secondary | ICD-10-CM

## 2023-10-07 DIAGNOSIS — F19981 Other psychoactive substance use, unspecified with psychoactive substance-induced sexual dysfunction: Secondary | ICD-10-CM

## 2023-10-07 DIAGNOSIS — R7309 Other abnormal glucose: Secondary | ICD-10-CM

## 2023-10-07 DIAGNOSIS — N3001 Acute cystitis with hematuria: Secondary | ICD-10-CM

## 2023-10-07 MED ORDER — METOPROLOL SUCCINATE ER 25 MG PO TB24
25.0000 mg | ORAL_TABLET | Freq: Every day | ORAL | 1 refills | Status: DC
  Filled 2023-10-07: qty 90, 90d supply, fill #0
  Filled 2024-01-30: qty 90, 90d supply, fill #1

## 2023-10-07 MED ORDER — LORAZEPAM 0.5 MG PO TABS
0.5000 mg | ORAL_TABLET | Freq: Three times a day (TID) | ORAL | 2 refills | Status: DC
  Filled 2023-10-07: qty 30, 10d supply, fill #0
  Filled 2024-04-04: qty 30, 10d supply, fill #1

## 2023-10-07 MED ORDER — FLUTICASONE PROPIONATE 50 MCG/ACT NA SUSP
2.0000 | Freq: Every day | NASAL | 3 refills | Status: DC
  Filled 2023-10-07: qty 32, 60d supply, fill #0

## 2023-10-07 MED ORDER — NITROFURANTOIN MONOHYD MACRO 100 MG PO CAPS
100.0000 mg | ORAL_CAPSULE | Freq: Two times a day (BID) | ORAL | 0 refills | Status: DC
  Filled 2023-10-07: qty 60, 30d supply, fill #0

## 2023-10-07 MED ORDER — AZITHROMYCIN 250 MG PO TABS
ORAL_TABLET | ORAL | 0 refills | Status: AC
  Filled 2023-10-07: qty 6, 5d supply, fill #0

## 2023-10-07 MED ORDER — BUPROPION HCL 75 MG PO TABS
75.0000 mg | ORAL_TABLET | Freq: Two times a day (BID) | ORAL | 1 refills | Status: DC
  Filled 2023-10-07 – 2023-12-20 (×2): qty 180, 90d supply, fill #0

## 2023-10-07 MED ORDER — LISDEXAMFETAMINE DIMESYLATE 40 MG PO CAPS
40.0000 mg | ORAL_CAPSULE | ORAL | 0 refills | Status: DC
  Filled 2023-10-07: qty 30, 30d supply, fill #0

## 2023-10-07 NOTE — Progress Notes (Unsigned)
Acute Office Visit  Subjective:    Patient ID: Sheri Moore, female    DOB: 1981/04/15, 42 y.o.   MRN: [id number]  Chief Complaint  Patient presents with   Sinusitis    History of Present Illness         Patient is a 42 year old white female who presents for acute sinus symptoms.  She is having sinus congestion, sinus headache, and postnasal drainage for 24 hours.  She has a mild sore throat.  Patient is on Vyvanse 30 mg daily for ADD.  This helps but she would like to increase it if possible.  Hyperlipidemia: Patient is currently on Crestor 5 mg nightly.  She is eating healthy and exercising.  She is due to have this rechecked.  Mild recurrent depression with anxiety: Currently on Wellbutrin 75 mg twice daily and lorazepam 0.5 mg daily as needed.   Breast cancer: on letrozole.   Palpitations: currently on toprol xl 25 mg daily.   On macrobid once daily as needed for prophylaxis against UTIs following intercourse.    Past Medical History:  Diagnosis Date   Anxiety    Breast cancer (HCC) 07/06/21   Right Breast, first seen on mammogram done 07/06/21   Depression    Family history of breast cancer    Family history of melanoma    Family history of uterine cancer    Fibroid    Gastroschisis    Left arm numbness 10/17/2018   Left arm weakness 10/17/2018    Past Surgical History:  Procedure Laterality Date   ABDOMINAL HYSTERECTOMY     supracervical hysterectomy   ABDOMINAL SURGERY     as a newborn, gastrogheschesis   BREAST LUMPECTOMY Right 09/10/2021   BREAST LUMPECTOMY WITH RADIOACTIVE SEED AND SENTINEL LYMPH NODE BIOPSY Right 09/10/2021   Procedure: RIGHT BREAST LUMPECTOMY WITH RADIOACTIVE SEED AND SENTINEL LYMPH NODE BIOPSY;  Surgeon: Almond Lint, MD;  Location: [facility];  Service: General;  Laterality: Right;   OOPHORECTOMY      Family History  Problem Relation Age of Onset   Uterine cancer Mother 37   Diabetes Mother    Hypertension Mother    Thyroid  disease Mother    Obesity Mother    Subarachnoid hemorrhage Father    Melanoma Father 19   Diabetes Father    Diabetes Sister    Obesity Sister    Diabetes Maternal Grandmother    Hypertension Maternal Grandmother    Melanoma Paternal Grandfather 26   Diabetes Paternal Grandfather    Breast cancer Other 22       MGMs sister   Breast cancer Other 89       MGF's sister   Breast cancer Other        dx < 50; PGMs sister    Social History   Socioeconomic History   Marital status: Married    Spouse name: Not on file   Number of children: 1   Years of education: College   Highest education level: Associate degree: occupational, Scientist, product/process development, or vocational program  Occupational History   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, fashion/clothing  Tobacco Use   Smoking status: Former    Current packs/day: 0.00    Average packs/day: 0.3 packs/day for 3.0 years (0.8 ttl pk-yrs)    Types: Cigarettes    Start date: 10/11/1998    Quit date: 10/11/2001    Years since quitting: 22.0   Smokeless tobacco: Former  Building services engineer status: Never Used  Substance and  Sexual Activity   Alcohol use: Not Currently    Comment: occ   Drug use: No   Sexual activity: Yes    Partners: Male    Birth control/protection: Surgical    Comment: supracervical hysterectomy, menarche 42yo, sexual debut 42yo  Other Topics Concern   Not on file  Social History Narrative   Lives with husband and son   Caffeine use: rarely   Right handed    Social Drivers of Health   Financial Resource Strain: Low Risk  (10/07/2023)   Overall Financial Resource Strain (CARDIA)    Difficulty of Paying Living Expenses: Not hard at all  Food Insecurity: No Food Insecurity (10/07/2023)   Hunger Vital Sign    Worried About Running Out of Food in the Last Year: Never true    Ran Out of Food in the Last Year: Never true  Transportation Needs: No Transportation Needs (10/07/2023)   PRAPARE - Administrator, Civil Service (Medical): No     Lack of Transportation (Non-Medical): No  Physical Activity: Insufficiently Active (10/07/2023)   Exercise Vital Sign    Days of Exercise per Week: 3 days    Minutes of Exercise per Session: 20 min  Stress: No Stress Concern Present (10/07/2023)   [employer] of Occupational Health - Occupational Stress Questionnaire    Feeling of Stress : Only a little  Social Connections: Moderately Integrated (10/07/2023)   Social Connection and Isolation Panel [NHANES]    Frequency of Communication with Friends and Family: More than three times a week    Frequency of Social Gatherings with Friends and Family: Three times a week    Attends Religious Services: More than 4 times per year    Active Member of Clubs or Organizations: No    Attends Banker Meetings: Not on file    Marital Status: Married  Intimate Partner Violence: Not At Risk (09/08/2021)   Humiliation, Afraid, Rape, and Kick questionnaire    Fear of Current or Ex-Partner: No    Emotionally Abused: No    Physically Abused: No    Sexually Abused: No    Outpatient Medications Prior to Visit  Medication Sig Dispense Refill   azithromycin (ZITHROMAX) 250 MG tablet Take 2 tablets by mouth on day 1, then 1 tablet daily on days 2 through 5 6 tablet 0   buPROPion (WELLBUTRIN) 75 MG tablet Take 1 tablet (75 mg total) by mouth 2 (two) times daily. 180 tablet 1   fluticasone (FLONASE) 50 MCG/ACT nasal spray Place 2 sprays into both nostrils daily. 42 g 3   gabapentin (NEURONTIN) 300 MG capsule Take 1 capsule (300 mg total) by mouth 3 (three) times daily. 90 capsule 0   letrozole (FEMARA) 2.5 MG tablet Take 1 tablet (2.5 mg total) by mouth daily. 90 tablet 1   lisdexamfetamine (VYVANSE) 40 MG capsule Take 1 capsule (40 mg total) by mouth every morning. 30 capsule 0   loratadine (CLARITIN) 10 MG tablet Take 10 mg by mouth daily.     LORazepam (ATIVAN) 0.5 MG tablet Take 1 tablet (0.5 mg total) by mouth every 8 (eight) hours. 30  tablet 2   metoprolol succinate (TOPROL-XL) 25 MG 24 hr tablet Take 1 tablet (25 mg total) by mouth daily. 90 tablet 1   nitrofurantoin, macrocrystal-monohydrate, (MACROBID) 100 MG capsule Take 1 capsule (100 mg total) by mouth 2 (two) times daily. 60 capsule 0   rosuvastatin (CRESTOR) 5 MG tablet Take 1 tablet (5 mg  total) by mouth daily. 90 tablet 1   No facility-administered medications prior to visit.    No Known Allergies  Review of Systems  Constitutional:  Negative for chills, fatigue and fever.  HENT:  Positive for congestion, postnasal drip, sinus pressure and sinus pain. Negative for ear pain and sore throat.   Respiratory:  Negative for cough and shortness of breath.   Cardiovascular:  Negative for chest pain.  Neurological:  Negative for dizziness and headaches.  Psychiatric/Behavioral:  Positive for decreased concentration. Negative for dysphoric mood. The patient is not nervous/anxious.        Objective:        10/07/2023    8:10 AM 07/05/2023    2:25 PM 06/03/2023   10:22 AM  Vitals with BMI  Height 5' 4.5"  5' 4.5"  Weight 169 lbs 162 lbs 3 oz 150 lbs  BMI 28.57  25.36  Systolic 122 121 086  Diastolic 84 75 78  Pulse 90 84 81    Orthostatic VS for the past 72 hrs (Last 3 readings):  Patient Position BP Location Cuff Size  10/07/23 0810 Sitting Left Arm Normal     Physical Exam Vitals reviewed.  Constitutional:      Appearance: Normal appearance. She is normal weight.  HENT:     Right Ear: Tympanic membrane normal.     Left Ear: Tympanic membrane normal.     Nose: Congestion (erythema, edema.) present.     Comments: Sinus tenderness BL.     Mouth/Throat:     Pharynx: No oropharyngeal exudate or posterior oropharyngeal erythema.  Neck:     Vascular: No carotid bruit.  Cardiovascular:     Rate and Rhythm: Normal rate and regular rhythm.     Heart sounds: Normal heart sounds.  Pulmonary:     Effort: Pulmonary effort is normal. No respiratory  distress.     Breath sounds: Normal breath sounds.  Neurological:     Mental Status: She is alert and oriented to person, place, and time.  Psychiatric:        Mood and Affect: Mood normal.        Behavior: Behavior normal.     Health Maintenance Due  Topic Date Due   COVID-19 Vaccine (4 - 2024-25 season) 06/12/2023    There are no preventive care reminders to display for this patient.   Lab Results  Component Value Date   TSH 2.690 05/25/2023   Lab Results  Component Value Date   WBC 4.4 07/05/2023   HGB 13.1 07/05/2023   HCT 36.8 07/05/2023   MCV 82.5 07/05/2023   PLT 242 07/05/2023   Lab Results  Component Value Date   NA 142 07/05/2023   K 3.6 07/05/2023   CO2 24 07/05/2023   GLUCOSE 156 (H) 07/05/2023   BUN 14 07/05/2023   CREATININE 0.90 07/05/2023   BILITOT 0.6 07/05/2023   ALKPHOS 92 07/05/2023   AST 27 07/05/2023   ALT 32 07/05/2023   PROT 6.7 07/05/2023   ALBUMIN 4.3 07/05/2023   CALCIUM 9.2 07/05/2023   ANIONGAP 8 07/05/2023   EGFR 112 05/25/2023   Lab Results  Component Value Date   CHOL 219 (H) 05/25/2023   Lab Results  Component Value Date   HDL 61 05/25/2023   Lab Results  Component Value Date   LDLCALC 139 (H) 05/25/2023   Lab Results  Component Value Date   TRIG 106 05/25/2023   Lab Results  Component Value Date  CHOLHDL 3.6 05/25/2023   No results found for: "HGBA1C"     Assessment & Plan:  Acute non-recurrent maxillary sinusitis  Attention deficit disorder (ADD) without hyperactivity  Mixed hyperlipidemia  Tachycardia  Malignant neoplasm of upper-outer quadrant of right breast in female, estrogen receptor positive (HCC)  Depression, major, recurrent, mild (HCC)     No orders of the defined types were placed in this encounter.   No orders of the defined types were placed in this encounter.    Follow-up: No follow-ups on file.  An After Visit Summary was printed and given to the patient.  Blane Ohara,  MD [facility] [phone]

## 2023-10-07 NOTE — Progress Notes (Unsigned)
Acute Office Visit  Subjective:    Patient ID: Sheri Moore, female    DOB: 16-Nov-1980, 42 y.o.   MRN: [id number]  Chief Complaint  Patient presents with   Sinusitis    Discussed the use of AI scribe software for clinical note transcription with the patient, who gave verbal consent to proceed.   HPI:  Patient present today with congestion, headache, and sore throat. Stated her symptoms started today.  Upper respiratory symptoms She complains of congestion and headache described as dull .with no fever, chills, night sweats or weight loss. Onset of symptoms was yesterday and staying constant.She is drinking plenty of fluids.  Past history is significant for no history of pneumonia or bronchitis. Patient is non-smoker   Past Medical History:  Diagnosis Date   Anxiety    Breast cancer (HCC) 07/06/21   Right Breast, first seen on mammogram done 07/06/21   Depression    Family history of breast cancer    Family history of melanoma    Family history of uterine cancer    Fibroid    Gastroschisis    Left arm numbness 10/17/2018   Left arm weakness 10/17/2018    Past Surgical History:  Procedure Laterality Date   ABDOMINAL HYSTERECTOMY     supracervical hysterectomy   ABDOMINAL SURGERY     as a newborn, gastrogheschesis   BREAST LUMPECTOMY Right 09/10/2021   BREAST LUMPECTOMY WITH RADIOACTIVE SEED AND SENTINEL LYMPH NODE BIOPSY Right 09/10/2021   Procedure: RIGHT BREAST LUMPECTOMY WITH RADIOACTIVE SEED AND SENTINEL LYMPH NODE BIOPSY;  Surgeon: Almond Lint, MD;  Location: [facility] OR;  Service: General;  Laterality: Right;   OOPHORECTOMY      Family History  Problem Relation Age of Onset   Uterine cancer Mother 34   Diabetes Mother    Hypertension Mother    Thyroid disease Mother    Obesity Mother    Subarachnoid hemorrhage Father    Melanoma Father 31   Diabetes Father    Diabetes Sister    Obesity Sister    Diabetes Maternal Grandmother    Hypertension Maternal  Grandmother    Melanoma Paternal Grandfather 11   Diabetes Paternal Grandfather    Breast cancer Other 17       MGMs sister   Breast cancer Other 52       MGF's sister   Breast cancer Other        dx < 50; PGMs sister    Social History   Socioeconomic History   Marital status: Married    Spouse name: Not on file   Number of children: 1   Years of education: College   Highest education level: Associate degree: occupational, Scientist, product/process development, or vocational program  Occupational History   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, fashion/clothing  Tobacco Use   Smoking status: Former    Current packs/day: 0.00    Average packs/day: 0.3 packs/day for 3.0 years (0.8 ttl pk-yrs)    Types: Cigarettes    Start date: 10/11/1998    Quit date: 10/11/2001    Years since quitting: 22.0   Smokeless tobacco: Former  Building services engineer status: Never Used  Substance and Sexual Activity   Alcohol use: Not Currently    Comment: occ   Drug use: No   Sexual activity: Yes    Partners: Male    Birth control/protection: Surgical    Comment: supracervical hysterectomy, menarche 42yo, sexual debut 42yo  Other Topics Concern   Not on file  Social History Narrative   Lives with husband and son   Caffeine use: rarely   Right handed    Social Drivers of Health   Financial Resource Strain: Low Risk  (10/07/2023)   Overall Financial Resource Strain (CARDIA)    Difficulty of Paying Living Expenses: Not hard at all  Food Insecurity: No Food Insecurity (10/07/2023)   Hunger Vital Sign    Worried About Running Out of Food in the Last Year: Never true    Ran Out of Food in the Last Year: Never true  Transportation Needs: No Transportation Needs (10/07/2023)   PRAPARE - Administrator, Civil Service (Medical): No    Lack of Transportation (Non-Medical): No  Physical Activity: Insufficiently Active (10/07/2023)   Exercise Vital Sign    Days of Exercise per Week: 3 days    Minutes of Exercise per Session: 20 min   Stress: No Stress Concern Present (10/07/2023)   [employer] of Occupational Health - Occupational Stress Questionnaire    Feeling of Stress : Only a little  Social Connections: Moderately Integrated (10/07/2023)   Social Connection and Isolation Panel [NHANES]    Frequency of Communication with Friends and Family: More than three times a week    Frequency of Social Gatherings with Friends and Family: Three times a week    Attends Religious Services: More than 4 times per year    Active Member of Clubs or Organizations: No    Attends Banker Meetings: Not on file    Marital Status: Married  Intimate Partner Violence: Not At Risk (09/08/2021)   Humiliation, Afraid, Rape, and Kick questionnaire    Fear of Current or Ex-Partner: No    Emotionally Abused: No    Physically Abused: No    Sexually Abused: No    Outpatient Medications Prior to Visit  Medication Sig Dispense Refill   buPROPion (WELLBUTRIN) 75 MG tablet Take 1 tablet (75 mg total) by mouth 2 (two) times daily. 180 tablet 1   fluticasone (FLONASE) 50 MCG/ACT nasal spray Place 2 sprays into both nostrils daily. 42 g 3   gabapentin (NEURONTIN) 300 MG capsule Take 1 capsule (300 mg total) by mouth 3 (three) times daily. 90 capsule 0   letrozole (FEMARA) 2.5 MG tablet Take 1 tablet (2.5 mg total) by mouth daily. 90 tablet 1   lisdexamfetamine (VYVANSE) 40 MG capsule Take 1 capsule (40 mg total) by mouth every morning. 30 capsule 0   loratadine (CLARITIN) 10 MG tablet Take 10 mg by mouth daily.     metoprolol succinate (TOPROL-XL) 25 MG 24 hr tablet Take 1 tablet (25 mg total) by mouth daily. 90 tablet 1   rosuvastatin (CRESTOR) 5 MG tablet Take 1 tablet (5 mg total) by mouth daily. 90 tablet 1   No facility-administered medications prior to visit.    No Known Allergies  Review of Systems  Constitutional:  Negative for fatigue.  HENT:  Positive for congestion and sore throat. Negative for ear pain.    Respiratory:  Negative for cough and shortness of breath.   Cardiovascular:  Negative for chest pain.  Gastrointestinal:  Negative for abdominal pain, constipation, diarrhea, nausea and vomiting.  Genitourinary:  Negative for dysuria, frequency and urgency.  Musculoskeletal:  Negative for arthralgias, back pain and myalgias.  Neurological:  Positive for headaches. Negative for dizziness.  Psychiatric/Behavioral:  Negative for agitation and sleep disturbance. The patient is not nervous/anxious.        Objective:  10/07/2023    8:10 AM 07/05/2023    2:25 PM 06/03/2023   10:22 AM  Vitals with BMI  Height 5' 4.5"  5' 4.5"  Weight 169 lbs 162 lbs 3 oz 150 lbs  BMI 28.57  25.36  Systolic 122 121 952  Diastolic 84 75 78  Pulse 90 84 81    Orthostatic VS for the past 72 hrs (Last 3 readings):  Patient Position BP Location Cuff Size  10/07/23 0810 Sitting Left Arm Normal     Physical Exam  Health Maintenance Due  Topic Date Due   COVID-19 Vaccine (4 - 2024-25 season) 06/12/2023    There are no preventive care reminders to display for this patient.   Lab Results  Component Value Date   TSH 2.690 05/25/2023   Lab Results  Component Value Date   WBC 4.4 07/05/2023   HGB 13.1 07/05/2023   HCT 36.8 07/05/2023   MCV 82.5 07/05/2023   PLT 242 07/05/2023   Lab Results  Component Value Date   NA 142 07/05/2023   K 3.6 07/05/2023   CO2 24 07/05/2023   GLUCOSE 156 (H) 07/05/2023   BUN 14 07/05/2023   CREATININE 0.90 07/05/2023   BILITOT 0.6 07/05/2023   ALKPHOS 92 07/05/2023   AST 27 07/05/2023   ALT 32 07/05/2023   PROT 6.7 07/05/2023   ALBUMIN 4.3 07/05/2023   CALCIUM 9.2 07/05/2023   ANIONGAP 8 07/05/2023   EGFR 112 05/25/2023   Lab Results  Component Value Date   CHOL 219 (H) 05/25/2023   Lab Results  Component Value Date   HDL 61 05/25/2023   Lab Results  Component Value Date   LDLCALC 139 (H) 05/25/2023   Lab Results  Component Value Date    TRIG 106 05/25/2023   Lab Results  Component Value Date   CHOLHDL 3.6 05/25/2023   No results found for: "HGBA1C"     Assessment & Plan:  There are no diagnoses linked to this encounter.   No orders of the defined types were placed in this encounter.   No orders of the defined types were placed in this encounter.    Follow-up: No follow-ups on file.  An After Visit Summary was printed and given to the patient.  Blane Ohara, MD [facility] [phone]

## 2023-10-08 LAB — CBC WITH DIFFERENTIAL/PLATELET
Basophils Absolute: 0.1 10*3/uL (ref 0.0–0.2)
Basos: 1 %
EOS (ABSOLUTE): 0.1 10*3/uL (ref 0.0–0.4)
Eos: 1 %
Hematocrit: 41.8 % (ref 34.0–46.6)
Hemoglobin: 14.2 g/dL (ref 11.1–15.9)
Immature Grans (Abs): 0 10*3/uL (ref 0.0–0.1)
Immature Granulocytes: 0 %
Lymphocytes Absolute: 1.5 10*3/uL (ref 0.7–3.1)
Lymphs: 19 %
MCH: 29 pg (ref 26.6–33.0)
MCHC: 34 g/dL (ref 31.5–35.7)
MCV: 86 fL (ref 79–97)
Monocytes Absolute: 0.4 10*3/uL (ref 0.1–0.9)
Monocytes: 5 %
Neutrophils Absolute: 5.8 10*3/uL (ref 1.4–7.0)
Neutrophils: 74 %
Platelets: 297 10*3/uL (ref 150–450)
RBC: 4.89 x10E6/uL (ref 3.77–5.28)
RDW: 12.4 % (ref 11.7–15.4)
WBC: 7.9 10*3/uL (ref 3.4–10.8)

## 2023-10-08 LAB — HEMOGLOBIN A1C
Est. average glucose Bld gHb Est-mCnc: 128 mg/dL
Hgb A1c MFr Bld: 6.1 % — ABNORMAL HIGH (ref 4.8–5.6)

## 2023-10-08 LAB — COMPREHENSIVE METABOLIC PANEL
ALT: 45 [IU]/L — ABNORMAL HIGH (ref 0–32)
AST: 33 [IU]/L (ref 0–40)
Albumin: 4.6 g/dL (ref 3.9–4.9)
Alkaline Phosphatase: 93 [IU]/L (ref 44–121)
BUN/Creatinine Ratio: 20 (ref 9–23)
BUN: 13 mg/dL (ref 6–24)
Bilirubin Total: 1 mg/dL (ref 0.0–1.2)
CO2: 24 mmol/L (ref 20–29)
Calcium: 9.4 mg/dL (ref 8.7–10.2)
Chloride: 102 mmol/L (ref 96–106)
Creatinine, Ser: 0.65 mg/dL (ref 0.57–1.00)
Globulin, Total: 2.2 g/dL (ref 1.5–4.5)
Glucose: 98 mg/dL (ref 70–99)
Potassium: 4.4 mmol/L (ref 3.5–5.2)
Sodium: 141 mmol/L (ref 134–144)
Total Protein: 6.8 g/dL (ref 6.0–8.5)
eGFR: 111 mL/min/{1.73_m2} (ref >59)

## 2023-10-08 LAB — LIPID PANEL
Chol/HDL Ratio: 3.3 {ratio} (ref 0.0–4.4)
Cholesterol, Total: 142 mg/dL (ref 100–199)
HDL: 43 mg/dL (ref >39)
LDL Chol Calc (NIH): 74 mg/dL (ref 0–99)
Triglycerides: 143 mg/dL (ref 0–149)
VLDL Cholesterol Cal: 25 mg/dL (ref 5–40)

## 2023-10-09 DIAGNOSIS — R7309 Other abnormal glucose: Secondary | ICD-10-CM | POA: Insufficient documentation

## 2023-10-09 NOTE — Progress Notes (Signed)
 Acute Office Visit  Subjective:    Patient ID: Sheri Moore, female    DOB: 01/18/1942, 42 y.o.   MRN: [id number]  No chief complaint on file.   HPI Patient is in today for ***  Past Medical History:  Diagnosis Date   Chronic obstructive pulmonary disease (COPD) (HCC) 09/19/2018   COPD (chronic obstructive pulmonary disease) (HCC)    Diplopia 03/04/2020   Mixed hyperlipidemia 03/04/2020   Morbid (severe) obesity with alveolar hypoventilation (HCC) 03/04/2020   Personal history of malignant neoplasm of prostate 03/04/2020   Type 2 diabetes mellitus with other specified complication (HCC) 03/04/2020    Past Surgical History:  Procedure Laterality Date   OPEN REDUCTION INTERNAL FIXATION (ORIF) TIBIA/FIBULA FRACTURE Right    oral/facial surgery repair     PROSTATE BIOPSY     RADIOACTIVE SEED IMPLANT N/A 07/24/2015   Procedure: RADIOACTIVE SEED IMPLANT/BRACHYTHERAPY IMPLANT;  Surgeon: Roberto Chao, MD;  Location: [facility];  Service: Urology;  Laterality: N/A;  86 SEEDS IMPLANTED NO SEEDS FOUND IN BLADDER   ROTATOR CUFF REPAIR Left     Family History  Problem Relation Age of Onset   Heart attack Sister    Heart attack Brother     Social History   Socioeconomic History   Marital status: Married    Spouse name: Not on file   Number of children: Not on file   Years of education: Not on file   Highest education level: Not on file  Occupational History   Not on file  Tobacco Use   Smoking status: Former    Packs/day: 4.00    Years: 6.00    Total pack years: 24.00    Types: Cigarettes    Quit date: 10/11/1974    Years since quitting: 47.8   Smokeless tobacco: Never  Vaping Use   Vaping Use: Never used  Substance and Sexual Activity   Alcohol use: No    Comment: moderate alcohol use   Drug use: No   Sexual activity: Yes  Other Topics Concern   Not on file  Social History Narrative   Not on file   Social Determinants of Health   Financial  Resource Strain: Not on file  Food Insecurity: No Food Insecurity (03/05/2020)   Hunger Vital Sign    Worried About Running Out of Food in the Last Year: Never true    Ran Out of Food in the Last Year: Never true  Transportation Needs: No Transportation Needs (03/05/2020)   PRAPARE - Transportation    Lack of Transportation (Medical): No    Lack of Transportation (Non-Medical): No  Physical Activity: Not on file  Stress: Not on file  Social Connections: Not on file  Intimate Partner Violence: Not on file    Outpatient Medications Prior to Visit  Medication Sig Dispense Refill   albuterol (VENTOLIN HFA) 108 (90 Base) MCG/ACT inhaler Inhale 2 puffs into the lungs every 6 (six) hours as needed for wheezing or shortness of breath. 8 g 6   aspirin 81 MG tablet Take 81 mg by mouth daily.     atenolol (TENORMIN) 25 MG tablet TAKE 1 TABLET(25 MG) BY MOUTH EVERY DAY 90 tablet 2   Fluticasone-Umeclidin-Vilant (TRELEGY ELLIPTA) 200-62.5-25 MCG/ACT AEPB Inhale 1 puff into the lungs daily. 180 each 3   gabapentin (NEURONTIN) 100 MG capsule Take 1 capsule (100 mg total) by mouth 3 (three) times daily. 180 capsule 0   glucose blood test strip Use as instructed 100   each 12   hydrochlorothiazide (HYDRODIURIL) 25 MG tablet TAKE 1 TABLET(25 MG) BY MOUTH 1 TIME PER DAY 90 tablet 2   ipratropium-albuterol (DUONEB) 0.5-2.5 (3) MG/3ML SOLN USE 3 ML VIA NEBULIZER EVERY 4 HOURS AS NEEDED 360 mL 2   metFORMIN (GLUCOPHAGE) 500 MG tablet TAKE 1 TABLET BY MOUTH TWICE DAILY 180 tablet 0   naproxen sodium (ANAPROX) 220 MG tablet Take 220 mg by mouth 2 (two) times daily with a meal.     OXYGEN Inhale 2 L into the lungs daily as needed.     simvastatin (ZOCOR) 80 MG tablet TAKE 1 TABLET(80 MG) BY MOUTH DAILY 90 tablet 2   tamsulosin (FLOMAX) 0.4 MG CAPS capsule TAKE ONE CAPSULE BY MOUTH TWICE DAILY, 1/2 HOUR AFTER MEAL 180 capsule 2   No facility-administered medications prior to visit.    Allergies  Allergen  Reactions   Lyrica [Pregabalin] Diarrhea    Review of Systems  Constitutional:  Negative for appetite change, fatigue and fever.  HENT:  Negative for congestion, ear pain, sinus pressure and sore throat.   Respiratory:  Negative for cough, chest tightness, shortness of breath and wheezing.   Cardiovascular:  Negative for chest pain and palpitations.  Gastrointestinal:  Positive for abdominal pain and diarrhea. Negative for constipation, nausea and vomiting.  Genitourinary:  Negative for dysuria and hematuria.  Musculoskeletal:  Negative for arthralgias, back pain, joint swelling and myalgias.  Skin:  Negative for rash.  Neurological:  Negative for dizziness, weakness and headaches.  Psychiatric/Behavioral:  Negative for dysphoric mood. The patient is not nervous/anxious.        Objective:    Physical Exam  There were no vitals taken for this visit. Wt Readings from Last 3 Encounters:  05/18/22 208 lb 3.2 oz (94.4 kg)  02/24/22 214 lb (97.1 kg)  11/23/21 212 lb 12.8 oz (96.5 kg)    Health Maintenance Due  Topic Date Due   TETANUS/TDAP  Never done   Zoster Vaccines- Shingrix (1 of 2) Never done   Medicare Annual Wellness (AWV)  07/16/2017   COVID-19 Vaccine (4 - Moderna series) 10/16/2020   OPHTHALMOLOGY EXAM  05/01/2021   INFLUENZA VACCINE  Never done   Pneumonia Vaccine 65+ Years old (2 - PCV) 08/28/2022    There are no preventive care reminders to display for this patient.   No results found for: "TSH" Lab Results  Component Value Date   WBC 8.7 02/24/2022   HGB 13.8 02/24/2022   HCT 40.2 02/24/2022   MCV 88 02/24/2022   PLT 246 02/24/2022   Lab Results  Component Value Date   NA 139 02/24/2022   K 4.2 02/24/2022   CO2 28 02/24/2022   GLUCOSE 173 (H) 02/24/2022   BUN 15 02/24/2022   CREATININE 0.70 (L) 02/24/2022   BILITOT 0.7 02/24/2022   ALKPHOS 75 02/24/2022   AST 20 02/24/2022   ALT 26 02/24/2022   PROT 6.6 02/24/2022   ALBUMIN 4.1 02/24/2022    CALCIUM 9.5 02/24/2022   EGFR 92 02/24/2022   Lab Results  Component Value Date   CHOL 152 02/24/2022   Lab Results  Component Value Date   HDL 47 02/24/2022   Lab Results  Component Value Date   LDLCALC 82 02/24/2022   Lab Results  Component Value Date   TRIG 131 02/24/2022   Lab Results  Component Value Date   CHOLHDL 3.2 02/24/2022   Lab Results  Component Value Date   HGBA1C 7.9 (H) 02/24/2022           Assessment & Plan:   There are no diagnoses linked to this encounter.   No orders of the defined types were placed in this encounter.    Lauren M Auman, CMA 

## 2023-10-09 NOTE — Assessment & Plan Note (Signed)
Not at goal. Increase vyvanse to 40 mg daily in am.

## 2023-10-09 NOTE — Assessment & Plan Note (Signed)
Management per specialist.   On letrozole daily.

## 2023-10-09 NOTE — Assessment & Plan Note (Signed)
At goal.  Continue toprol xl 25 mg daily.

## 2023-10-09 NOTE — Assessment & Plan Note (Signed)
Check A1c. 

## 2023-10-09 NOTE — Assessment & Plan Note (Signed)
The current medical regimen is effective;  continue present plan and medications. Continue wellbutrin and lorazepam.

## 2023-10-09 NOTE — Assessment & Plan Note (Signed)
Zpack prescription sent.

## 2023-10-09 NOTE — Assessment & Plan Note (Signed)
Improved and at goal.  Recommend continue to work on eating healthy diet and exercise. Continue crestor 5 mg before bed.

## 2023-10-09 NOTE — Progress Notes (Deleted)
 Acute Office Visit  Subjective:    Patient ID: Sheri Moore, female    DOB: 01/18/1942, 42 y.o.   MRN: [id number]  No chief complaint on file.   HPI Patient is in today for ***  Past Medical History:  Diagnosis Date   Chronic obstructive pulmonary disease (COPD) (HCC) 09/19/2018   COPD (chronic obstructive pulmonary disease) (HCC)    Diplopia 03/04/2020   Mixed hyperlipidemia 03/04/2020   Morbid (severe) obesity with alveolar hypoventilation (HCC) 03/04/2020   Personal history of malignant neoplasm of prostate 03/04/2020   Type 2 diabetes mellitus with other specified complication (HCC) 03/04/2020    Past Surgical History:  Procedure Laterality Date   OPEN REDUCTION INTERNAL FIXATION (ORIF) TIBIA/FIBULA FRACTURE Right    oral/facial surgery repair     PROSTATE BIOPSY     RADIOACTIVE SEED IMPLANT N/A 07/24/2015   Procedure: RADIOACTIVE SEED IMPLANT/BRACHYTHERAPY IMPLANT;  Surgeon: Roberto Chao, MD;  Location: [facility];  Service: Urology;  Laterality: N/A;  86 SEEDS IMPLANTED NO SEEDS FOUND IN BLADDER   ROTATOR CUFF REPAIR Left     Family History  Problem Relation Age of Onset   Heart attack Sister    Heart attack Brother     Social History   Socioeconomic History   Marital status: Married    Spouse name: Not on file   Number of children: Not on file   Years of education: Not on file   Highest education level: Not on file  Occupational History   Not on file  Tobacco Use   Smoking status: Former    Packs/day: 4.00    Years: 6.00    Total pack years: 24.00    Types: Cigarettes    Quit date: 10/11/1974    Years since quitting: 47.8   Smokeless tobacco: Never  Vaping Use   Vaping Use: Never used  Substance and Sexual Activity   Alcohol use: No    Comment: moderate alcohol use   Drug use: No   Sexual activity: Yes  Other Topics Concern   Not on file  Social History Narrative   Not on file   Social Determinants of Health   Financial  Resource Strain: Not on file  Food Insecurity: No Food Insecurity (03/05/2020)   Hunger Vital Sign    Worried About Running Out of Food in the Last Year: Never true    Ran Out of Food in the Last Year: Never true  Transportation Needs: No Transportation Needs (03/05/2020)   PRAPARE - Transportation    Lack of Transportation (Medical): No    Lack of Transportation (Non-Medical): No  Physical Activity: Not on file  Stress: Not on file  Social Connections: Not on file  Intimate Partner Violence: Not on file    Outpatient Medications Prior to Visit  Medication Sig Dispense Refill   albuterol (VENTOLIN HFA) 108 (90 Base) MCG/ACT inhaler Inhale 2 puffs into the lungs every 6 (six) hours as needed for wheezing or shortness of breath. 8 g 6   aspirin 81 MG tablet Take 81 mg by mouth daily.     atenolol (TENORMIN) 25 MG tablet TAKE 1 TABLET(25 MG) BY MOUTH EVERY DAY 90 tablet 2   Fluticasone-Umeclidin-Vilant (TRELEGY ELLIPTA) 200-62.5-25 MCG/ACT AEPB Inhale 1 puff into the lungs daily. 180 each 3   gabapentin (NEURONTIN) 100 MG capsule Take 1 capsule (100 mg total) by mouth 3 (three) times daily. 180 capsule 0   glucose blood test strip Use as instructed 100   each 12   hydrochlorothiazide (HYDRODIURIL) 25 MG tablet TAKE 1 TABLET(25 MG) BY MOUTH 1 TIME PER DAY 90 tablet 2   ipratropium-albuterol (DUONEB) 0.5-2.5 (3) MG/3ML SOLN USE 3 ML VIA NEBULIZER EVERY 4 HOURS AS NEEDED 360 mL 2   metFORMIN (GLUCOPHAGE) 500 MG tablet TAKE 1 TABLET BY MOUTH TWICE DAILY 180 tablet 0   naproxen sodium (ANAPROX) 220 MG tablet Take 220 mg by mouth 2 (two) times daily with a meal.     OXYGEN Inhale 2 L into the lungs daily as needed.     simvastatin (ZOCOR) 80 MG tablet TAKE 1 TABLET(80 MG) BY MOUTH DAILY 90 tablet 2   tamsulosin (FLOMAX) 0.4 MG CAPS capsule TAKE ONE CAPSULE BY MOUTH TWICE DAILY, 1/2 HOUR AFTER MEAL 180 capsule 2   No facility-administered medications prior to visit.    Allergies  Allergen  Reactions   Lyrica [Pregabalin] Diarrhea    Review of Systems  Constitutional:  Negative for appetite change, fatigue and fever.  HENT:  Negative for congestion, ear pain, sinus pressure and sore throat.   Respiratory:  Negative for cough, chest tightness, shortness of breath and wheezing.   Cardiovascular:  Negative for chest pain and palpitations.  Gastrointestinal:  Positive for abdominal pain and diarrhea. Negative for constipation, nausea and vomiting.  Genitourinary:  Negative for dysuria and hematuria.  Musculoskeletal:  Negative for arthralgias, back pain, joint swelling and myalgias.  Skin:  Negative for rash.  Neurological:  Negative for dizziness, weakness and headaches.  Psychiatric/Behavioral:  Negative for dysphoric mood. The patient is not nervous/anxious.        Objective:    Physical Exam  There were no vitals taken for this visit. Wt Readings from Last 3 Encounters:  05/18/22 208 lb 3.2 oz (94.4 kg)  02/24/22 214 lb (97.1 kg)  11/23/21 212 lb 12.8 oz (96.5 kg)    Health Maintenance Due  Topic Date Due   TETANUS/TDAP  Never done   Zoster Vaccines- Shingrix (1 of 2) Never done   Medicare Annual Wellness (AWV)  07/16/2017   COVID-19 Vaccine (4 - Moderna series) 10/16/2020   OPHTHALMOLOGY EXAM  05/01/2021   INFLUENZA VACCINE  Never done   Pneumonia Vaccine 65+ Years old (2 - PCV) 08/28/2022    There are no preventive care reminders to display for this patient.   No results found for: "TSH" Lab Results  Component Value Date   WBC 8.7 02/24/2022   HGB 13.8 02/24/2022   HCT 40.2 02/24/2022   MCV 88 02/24/2022   PLT 246 02/24/2022   Lab Results  Component Value Date   NA 139 02/24/2022   K 4.2 02/24/2022   CO2 28 02/24/2022   GLUCOSE 173 (H) 02/24/2022   BUN 15 02/24/2022   CREATININE 0.70 (L) 02/24/2022   BILITOT 0.7 02/24/2022   ALKPHOS 75 02/24/2022   AST 20 02/24/2022   ALT 26 02/24/2022   PROT 6.6 02/24/2022   ALBUMIN 4.1 02/24/2022    CALCIUM 9.5 02/24/2022   EGFR 92 02/24/2022   Lab Results  Component Value Date   CHOL 152 02/24/2022   Lab Results  Component Value Date   HDL 47 02/24/2022   Lab Results  Component Value Date   LDLCALC 82 02/24/2022   Lab Results  Component Value Date   TRIG 131 02/24/2022   Lab Results  Component Value Date   CHOLHDL 3.2 02/24/2022   Lab Results  Component Value Date   HGBA1C 7.9 (H) 02/24/2022           Assessment & Plan:   There are no diagnoses linked to this encounter.   No orders of the defined types were placed in this encounter.    Lauren M Auman, CMA 

## 2023-10-11 DIAGNOSIS — R7303 Prediabetes: Secondary | ICD-10-CM | POA: Insufficient documentation

## 2023-11-02 ENCOUNTER — Other Ambulatory Visit: Payer: Self-pay | Admitting: Hematology

## 2023-11-02 ENCOUNTER — Other Ambulatory Visit: Payer: Self-pay | Admitting: Family Medicine

## 2023-11-03 ENCOUNTER — Other Ambulatory Visit (HOSPITAL_BASED_OUTPATIENT_CLINIC_OR_DEPARTMENT_OTHER): Payer: Self-pay

## 2023-11-03 MED ORDER — LETROZOLE 2.5 MG PO TABS
2.5000 mg | ORAL_TABLET | Freq: Every day | ORAL | 1 refills | Status: DC
  Filled 2023-11-03: qty 90, 90d supply, fill #0
  Filled 2024-02-07: qty 90, 90d supply, fill #1

## 2023-11-04 ENCOUNTER — Other Ambulatory Visit (HOSPITAL_BASED_OUTPATIENT_CLINIC_OR_DEPARTMENT_OTHER): Payer: Self-pay

## 2023-11-04 MED ORDER — LISDEXAMFETAMINE DIMESYLATE 40 MG PO CAPS
40.0000 mg | ORAL_CAPSULE | ORAL | 0 refills | Status: DC
  Filled 2023-11-04 – 2024-03-15 (×2): qty 30, 30d supply, fill #0

## 2023-11-08 ENCOUNTER — Other Ambulatory Visit (HOSPITAL_BASED_OUTPATIENT_CLINIC_OR_DEPARTMENT_OTHER): Payer: Self-pay

## 2023-11-09 ENCOUNTER — Other Ambulatory Visit (HOSPITAL_BASED_OUTPATIENT_CLINIC_OR_DEPARTMENT_OTHER): Payer: Self-pay

## 2023-12-09 ENCOUNTER — Other Ambulatory Visit (HOSPITAL_BASED_OUTPATIENT_CLINIC_OR_DEPARTMENT_OTHER): Payer: Self-pay

## 2023-12-09 ENCOUNTER — Other Ambulatory Visit: Payer: Self-pay | Admitting: Family Medicine

## 2023-12-09 MED ORDER — CIPROFLOXACIN HCL 500 MG PO TABS
500.0000 mg | ORAL_TABLET | Freq: Two times a day (BID) | ORAL | 0 refills | Status: AC
  Filled 2023-12-09: qty 14, 7d supply, fill #0

## 2023-12-09 MED ORDER — ONDANSETRON HCL 4 MG PO TABS
4.0000 mg | ORAL_TABLET | Freq: Three times a day (TID) | ORAL | 0 refills | Status: DC | PRN
  Filled 2023-12-09: qty 30, 10d supply, fill #0

## 2023-12-20 ENCOUNTER — Other Ambulatory Visit (HOSPITAL_BASED_OUTPATIENT_CLINIC_OR_DEPARTMENT_OTHER): Payer: Self-pay

## 2023-12-20 ENCOUNTER — Other Ambulatory Visit: Payer: Self-pay | Admitting: Family Medicine

## 2023-12-20 MED ORDER — ROSUVASTATIN CALCIUM 5 MG PO TABS
5.0000 mg | ORAL_TABLET | Freq: Every day | ORAL | 1 refills | Status: DC
  Filled 2023-12-20: qty 90, 90d supply, fill #0
  Filled 2024-04-04: qty 90, 90d supply, fill #1

## 2023-12-20 MED ORDER — GABAPENTIN 300 MG PO CAPS
300.0000 mg | ORAL_CAPSULE | Freq: Three times a day (TID) | ORAL | 1 refills | Status: DC
  Filled 2023-12-20: qty 270, 90d supply, fill #0

## 2023-12-21 ENCOUNTER — Other Ambulatory Visit (HOSPITAL_BASED_OUTPATIENT_CLINIC_OR_DEPARTMENT_OTHER): Payer: Self-pay

## 2023-12-30 ENCOUNTER — Ambulatory Visit: Payer: 59 | Admitting: Radiology

## 2024-01-16 ENCOUNTER — Other Ambulatory Visit

## 2024-01-16 ENCOUNTER — Encounter: Payer: Self-pay | Admitting: Family Medicine

## 2024-01-16 ENCOUNTER — Ambulatory Visit: Payer: 59

## 2024-01-16 DIAGNOSIS — R7309 Other abnormal glucose: Secondary | ICD-10-CM

## 2024-01-16 DIAGNOSIS — E782 Mixed hyperlipidemia: Secondary | ICD-10-CM

## 2024-01-16 NOTE — Progress Notes (Signed)
 Subjective:  Patient ID: Sheri Moore, female    DOB: 1981-07-21  Age: 43 y.o. MRN: [id number]  Chief Complaint  Patient presents with   Medical Management of Chronic Issues   Discussed the use of AI scribe software for clinical note transcription with the patient, who gave verbal consent to proceed.  History of Present Illness   The patient, with a history of ADHD, diabetes, depression, and hyperlipidemia, presents with weight gain despite dietary changes and regular walking. She reports that her clothes are fitting the same. She has been taking vitamins and feels that eating better has helped clear her mind. Her recent A1c was 5.7, indicating good control of her diabetes.  She did not fill vyvanse due to cost.  She is currently taking Wellbutrin and lorazepam for depression, the latter of which she takes as needed. She has not tried taking the lorazepam before bed for her sleep issues.    Prediabetes: new diagnosis as of 10/07/2023. A1C 6.1. Improved to 5.7.  Hyperlipidemia: Takes rosuvastatin 5 mg daily.  Depression:  On bupropion 75 mg twice daily, Lorazepam 0.5 mg every 8 hours (hardly ever takes.)  Breast cancer: currently on femara  Allergic rhinitis: on flonase and claritin.     01/17/2024    4:19 PM 06/03/2023   10:27 AM 06/02/2022    2:21 PM 02/19/2022   11:37 AM 11/16/2021    1:59 PM  Depression screen PHQ 2/9  Decreased Interest 0 0 0 0 3  Down, Depressed, Hopeless 0 0 0 0 3  PHQ - 2 Score 0 0 0 0 6  Altered sleeping 2 0 0 3 2  Tired, decreased energy 0 1 1 3 2   Change in appetite 0  0 0 1  Feeling bad or failure about yourself  0 0 0 0 2  Trouble concentrating 1 2 1 3 2   Moving slowly or fidgety/restless 0 0 0 0 1  Suicidal thoughts 0 0 0 0 0  PHQ-9 Score 3 3 2 9 16   Difficult doing work/chores Not difficult at all  Not difficult at all Very difficult Somewhat difficult        06/03/2023   10:26 AM  Fall Risk   Falls in the past year? 0  Number falls in  past yr: 0  Injury with Fall? 0  Risk for fall due to : No Fall Risks  Follow up Falls evaluation completed    Patient Care Team: Mercy Stall, MD as PCP - General (Family Medicine) Sonja Millport, MD as Consulting Physician (Hematology) Lockie Rima, MD as Consulting Physician (General Surgery) Johna Myers, MD as Consulting Physician (Radiation Oncology)   Review of Systems  Constitutional:  Negative for chills, fatigue and fever.  HENT:  Negative for congestion, ear pain and sore throat.   Respiratory:  Negative for cough and shortness of breath.   Cardiovascular:  Negative for chest pain.  Gastrointestinal:  Negative for abdominal pain, constipation, diarrhea, nausea and vomiting.  Genitourinary:  Negative for dysuria and urgency.  Musculoskeletal:  Negative for arthralgias and myalgias.  Skin:  Negative for rash.  Neurological:  Negative for dizziness and headaches.  Psychiatric/Behavioral:  Negative for dysphoric mood. The patient is not nervous/anxious.     Current Outpatient Medications on File Prior to Visit  Medication Sig Dispense Refill   buPROPion (WELLBUTRIN) 75 MG tablet Take 1 tablet (75 mg total) by mouth 2 (two) times daily. 180 tablet 1   fluticasone (FLONASE) 50 MCG/ACT nasal spray  Place 2 sprays into both nostrils daily. 42 g 3   gabapentin (NEURONTIN) 300 MG capsule Take 1 capsule (300 mg total) by mouth 3 (three) times daily. 270 capsule 1   letrozole (FEMARA) 2.5 MG tablet Take 1 tablet (2.5 mg total) by mouth daily. 90 tablet 1   lisdexamfetamine (VYVANSE) 40 MG capsule Take 1 capsule (40 mg total) by mouth every morning. 30 capsule 0   loratadine (CLARITIN) 10 MG tablet Take 10 mg by mouth daily.     LORazepam (ATIVAN) 0.5 MG tablet Take 1 tablet (0.5 mg total) by mouth every 8 (eight) hours. 30 tablet 2   metoprolol succinate (TOPROL-XL) 25 MG 24 hr tablet Take 1 tablet (25 mg total) by mouth daily. 90 tablet 1   ondansetron (ZOFRAN) 4 MG tablet Take 1 tablet  (4 mg total) by mouth every 8 (eight) hours as needed for nausea or vomiting. 30 tablet 0   rosuvastatin (CRESTOR) 5 MG tablet Take 1 tablet (5 mg total) by mouth daily. 90 tablet 1   No current facility-administered medications on file prior to visit.   Past Medical History:  Diagnosis Date   Anxiety    Breast cancer (HCC) 07/06/21   Right Breast, first seen on mammogram done 07/06/21   Depression    Family history of breast cancer    Family history of melanoma    Family history of uterine cancer    Fibroid    Gastroschisis    Left arm numbness 10/17/2018   Left arm weakness 10/17/2018   Past Surgical History:  Procedure Laterality Date   ABDOMINAL HYSTERECTOMY     supracervical hysterectomy   ABDOMINAL SURGERY     as a newborn, gastrogheschesis   BREAST LUMPECTOMY Right 09/10/2021   BREAST LUMPECTOMY WITH RADIOACTIVE SEED AND SENTINEL LYMPH NODE BIOPSY Right 09/10/2021   Procedure: RIGHT BREAST LUMPECTOMY WITH RADIOACTIVE SEED AND SENTINEL LYMPH NODE BIOPSY;  Surgeon: Lockie Rima, MD;  Location: [facility];  Service: General;  Laterality: Right;   OOPHORECTOMY      Family History  Problem Relation Age of Onset   Uterine cancer Mother 29   Diabetes Mother    Hypertension Mother    Thyroid disease Mother    Obesity Mother    Subarachnoid hemorrhage Father    Melanoma Father 53   Diabetes Father    Diabetes Sister    Obesity Sister    Diabetes Maternal Grandmother    Hypertension Maternal Grandmother    Melanoma Paternal Grandfather 5   Diabetes Paternal Grandfather    Breast cancer Other 53       MGMs sister   Breast cancer Other 69       MGF's sister   Breast cancer Other        dx < 50; PGMs sister   Social History   Socioeconomic History   Marital status: Married    Spouse name: Not on file   Number of children: 1   Years of education: College   Highest education level: Associate degree: occupational, Scientist, product/process development, or vocational program  Occupational History    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, fashion/clothing  Tobacco Use   Smoking status: Former    Current packs/day: 0.00    Average packs/day: 0.3 packs/day for 3.0 years (0.8 ttl pk-yrs)    Types: Cigarettes    Start date: 10/11/1998    Quit date: 10/11/2001    Years since quitting: 22.2   Smokeless tobacco: Former  Building services engineer status:  Never Used  Substance and Sexual Activity   Alcohol use: Not Currently    Comment: occ   Drug use: No   Sexual activity: Yes    Partners: Male    Birth control/protection: Surgical    Comment: supracervical hysterectomy, menarche 43yo, sexual debut 43yo  Other Topics Concern   Not on file  Social History Narrative   Lives with husband and son   Caffeine use: rarely   Right handed    Social Drivers of Health   Financial Resource Strain: Low Risk  (10/07/2023)   Overall Financial Resource Strain (CARDIA)    Difficulty of Paying Living Expenses: Not hard at all  Food Insecurity: No Food Insecurity (10/07/2023)   Hunger Vital Sign    Worried About Running Out of Food in the Last Year: Never true    Ran Out of Food in the Last Year: Never true  Transportation Needs: No Transportation Needs (10/07/2023)   PRAPARE - Administrator, Civil Service (Medical): No    Lack of Transportation (Non-Medical): No  Physical Activity: Insufficiently Active (10/07/2023)   Exercise Vital Sign    Days of Exercise per Week: 3 days    Minutes of Exercise per Session: 20 min  Stress: No Stress Concern Present (10/07/2023)   [employer] of Occupational Health - Occupational Stress Questionnaire    Feeling of Stress : Only a little  Social Connections: Moderately Integrated (10/07/2023)   Social Connection and Isolation Panel [NHANES]    Frequency of Communication with Friends and Family: More than three times a week    Frequency of Social Gatherings with Friends and Family: Three times a week    Attends Religious Services: More than 4 times per year    Active  Member of Clubs or Organizations: No    Attends Engineer, structural: Not on file    Marital Status: Married    Objective:  BP 117/74   Pulse 64   Ht 5' 4.5" (1.638 m)   Wt 169 lb (76.7 kg)   SpO2 97%   BMI 28.56 kg/m      01/17/2024   12:05 PM 10/07/2023    8:10 AM 07/05/2023    2:25 PM  BP/Weight  Systolic BP 117 122 121  Diastolic BP 74 84 75  Wt. (Lbs) 169 169 162.2  BMI 28.56 kg/m2 28.56 kg/m2 27.41 kg/m2    Physical Exam Vitals reviewed.  Constitutional:      Appearance: Normal appearance. She is normal weight.  Neck:     Vascular: No carotid bruit.  Cardiovascular:     Rate and Rhythm: Normal rate and regular rhythm.     Heart sounds: Normal heart sounds.  Pulmonary:     Effort: Pulmonary effort is normal. No respiratory distress.     Breath sounds: Normal breath sounds.  Abdominal:     General: Abdomen is flat. Bowel sounds are normal.     Palpations: Abdomen is soft.     Tenderness: There is no abdominal tenderness.  Neurological:     Mental Status: She is alert and oriented to person, place, and time.  Psychiatric:        Mood and Affect: Mood normal.        Behavior: Behavior normal.     Diabetic Foot Exam - Simple   No data filed      Lab Results  Component Value Date   WBC 5.4 01/16/2024   HGB 13.5 01/16/2024   HCT 40.8  01/16/2024   PLT 252 01/16/2024   GLUCOSE 100 (H) 01/16/2024   CHOL 137 01/16/2024   TRIG 97 01/16/2024   HDL 49 01/16/2024   LDLCALC 70 01/16/2024   ALT 31 01/16/2024   AST 27 01/16/2024   NA 145 (H) 01/16/2024   K 4.4 01/16/2024   CL 104 01/16/2024   CREATININE 0.74 01/16/2024   BUN 14 01/16/2024   CO2 22 01/16/2024   TSH 2.690 05/25/2023   HGBA1C 5.7 (H) 01/16/2024      Assessment & Plan:   Mixed hyperlipidemia Assessment & Plan: Improved and at goal.  Recommend continue to work on eating healthy diet and exercise. Continue crestor 5 mg before bed.    Attention deficit disorder (ADD) without  hyperactivity Assessment & Plan: She is managing well without medication.  - Discuss GoodRx as a potential cost-saving option for Vyvanse.    Depression, major, recurrent, mild (HCC) Assessment & Plan: She is on Wellbutrin and lorazepam. Depression screen indicates mild issues with concentration and sleep, but overall she is doing well. Lorazepam may be taken before bed for sleep difficulties. Recommend start on contrave. During first week decrease wellbutrin to once daily and then discontinue it.    Prediabetes Assessment & Plan: Hemoglobin A1c 5.7%, 3 month avg of blood sugars, is in prediabetic range.  In order to prevent progression to diabetes, recommend low carb diet and regular exercise    Malignant neoplasm of upper-outer quadrant of right breast in female, estrogen receptor positive (HCC) Assessment & Plan: Management per specialist.   On letrozole daily.    Encounter for weight management Assessment & Plan: She struggles with weight loss despite improved diet and exercise, walking at least three days a week. Discussed potential use of Contrave, which includes Wellbutrin and naltrexone, for weight management. She is open to trying it, noting potential constipation and need to check insurance coverage. - Check insurance coverage for Contrave - Consider transitioning to Contrave for weight management if covered by insurance. Drop wellbutrin once daily x 1 week then discontinue as bupropion is contained in contrave.   Overweight with body mass index (BMI) of 28 to 28.9 in adult Assessment & Plan: She struggles with weight loss despite improved diet and exercise, walking at least three days a week. Discussed potential use of Contrave, which includes Wellbutrin and naltrexone, for weight management. She is open to trying it, noting potential constipation and need to check insurance coverage. - Check insurance coverage for Contrave - Consider transitioning to Contrave for weight  management if covered by insurance. Drop wellbutrin once daily x 1 week then discontinue as bupropion is contained in contrave.      No orders of the defined types were placed in this encounter.   No orders of the defined types were placed in this encounter.    Follow-up: No follow-ups on file.   I,Marla I Leal-Borjas,acting as a scribe for Mercy Stall, MD.,have documented all relevant documentation on the behalf of Mercy Stall, MD,as directed by  Mercy Stall, MD while in the presence of Mercy Stall, MD.   An After Visit Summary was printed and given to the patient.  I attest that I have reviewed this visit and agree with the plan scribed by my staff.   Mercy Stall, MD [facility] [phone]

## 2024-01-16 NOTE — Assessment & Plan Note (Signed)
 Improved and at goal.  Recommend continue to work on eating healthy diet and exercise. Continue crestor 5 mg before bed.

## 2024-01-16 NOTE — Assessment & Plan Note (Addendum)
 She is managing well without medication.  - Discuss GoodRx as a potential cost-saving option for Vyvanse.

## 2024-01-16 NOTE — Assessment & Plan Note (Addendum)
 She is on Wellbutrin and lorazepam. Depression screen indicates mild issues with concentration and sleep, but overall she is doing well. Lorazepam may be taken before bed for sleep difficulties. Recommend start on contrave. During first week decrease wellbutrin to once daily and then discontinue it.

## 2024-01-16 NOTE — Assessment & Plan Note (Addendum)
Hemoglobin A1c 5.7%, 3 month avg of blood sugars, is in prediabetic range.  In order to prevent progression to diabetes, recommend low carb diet and regular exercise  

## 2024-01-17 ENCOUNTER — Ambulatory Visit (INDEPENDENT_AMBULATORY_CARE_PROVIDER_SITE_OTHER): Payer: 59 | Admitting: Family Medicine

## 2024-01-17 ENCOUNTER — Encounter: Payer: Self-pay | Admitting: Family Medicine

## 2024-01-17 VITALS — BP 117/74 | HR 64 | Ht 64.5 in | Wt 169.0 lb

## 2024-01-17 DIAGNOSIS — C50411 Malignant neoplasm of upper-outer quadrant of right female breast: Secondary | ICD-10-CM

## 2024-01-17 DIAGNOSIS — Z6828 Body mass index (BMI) 28.0-28.9, adult: Secondary | ICD-10-CM | POA: Diagnosis not present

## 2024-01-17 DIAGNOSIS — Z7689 Persons encountering health services in other specified circumstances: Secondary | ICD-10-CM | POA: Diagnosis not present

## 2024-01-17 DIAGNOSIS — E663 Overweight: Secondary | ICD-10-CM

## 2024-01-17 DIAGNOSIS — Z170 Estrogen receptor positive status [ER+]: Secondary | ICD-10-CM | POA: Diagnosis not present

## 2024-01-17 DIAGNOSIS — F330 Major depressive disorder, recurrent, mild: Secondary | ICD-10-CM

## 2024-01-17 DIAGNOSIS — E782 Mixed hyperlipidemia: Secondary | ICD-10-CM | POA: Diagnosis not present

## 2024-01-17 DIAGNOSIS — R7303 Prediabetes: Secondary | ICD-10-CM | POA: Diagnosis not present

## 2024-01-17 DIAGNOSIS — F988 Other specified behavioral and emotional disorders with onset usually occurring in childhood and adolescence: Secondary | ICD-10-CM

## 2024-01-17 LAB — CMP14+EGFR
ALT: 31 IU/L (ref 0–32)
AST: 27 IU/L (ref 0–40)
Albumin: 4.4 g/dL (ref 3.9–4.9)
Alkaline Phosphatase: 135 IU/L — ABNORMAL HIGH (ref 44–121)
BUN/Creatinine Ratio: 19 (ref 9–23)
BUN: 14 mg/dL (ref 6–24)
Bilirubin Total: 0.2 mg/dL (ref 0.0–1.2)
CO2: 22 mmol/L (ref 20–29)
Calcium: 9.5 mg/dL (ref 8.7–10.2)
Chloride: 104 mmol/L (ref 96–106)
Creatinine, Ser: 0.74 mg/dL (ref 0.57–1.00)
Globulin, Total: 2 g/dL (ref 1.5–4.5)
Glucose: 100 mg/dL — ABNORMAL HIGH (ref 70–99)
Potassium: 4.4 mmol/L (ref 3.5–5.2)
Sodium: 145 mmol/L — ABNORMAL HIGH (ref 134–144)
Total Protein: 6.4 g/dL (ref 6.0–8.5)
eGFR: 102 mL/min/{1.73_m2} (ref >59)

## 2024-01-17 LAB — CBC WITH DIFFERENTIAL/PLATELET
Basophils Absolute: 0 x10E3/uL (ref 0.0–0.2)
Basos: 1 %
EOS (ABSOLUTE): 0.1 x10E3/uL (ref 0.0–0.4)
Eos: 2 %
Hematocrit: 40.8 % (ref 34.0–46.6)
Hemoglobin: 13.5 g/dL (ref 11.1–15.9)
Immature Grans (Abs): 0 x10E3/uL (ref 0.0–0.1)
Immature Granulocytes: 0 %
Lymphocytes Absolute: 2 x10E3/uL (ref 0.7–3.1)
Lymphs: 38 %
MCH: 29 pg (ref 26.6–33.0)
MCHC: 33.1 g/dL (ref 31.5–35.7)
MCV: 88 fL (ref 79–97)
Monocytes Absolute: 0.4 x10E3/uL (ref 0.1–0.9)
Monocytes: 7 %
Neutrophils Absolute: 2.9 x10E3/uL (ref 1.4–7.0)
Neutrophils: 52 %
Platelets: 252 x10E3/uL (ref 150–450)
RBC: 4.66 x10E6/uL (ref 3.77–5.28)
RDW: 12.6 % (ref 11.7–15.4)
WBC: 5.4 x10E3/uL (ref 3.4–10.8)

## 2024-01-17 LAB — LIPID PANEL
Chol/HDL Ratio: 2.8 ratio (ref 0.0–4.4)
Cholesterol, Total: 137 mg/dL (ref 100–199)
HDL: 49 mg/dL (ref >39)
LDL Chol Calc (NIH): 70 mg/dL (ref 0–99)
Triglycerides: 97 mg/dL (ref 0–149)
VLDL Cholesterol Cal: 18 mg/dL (ref 5–40)

## 2024-01-17 LAB — HEMOGLOBIN A1C
Est. average glucose Bld gHb Est-mCnc: 117 mg/dL
Hgb A1c MFr Bld: 5.7 % — ABNORMAL HIGH (ref 4.8–5.6)

## 2024-01-18 ENCOUNTER — Other Ambulatory Visit: Payer: Self-pay

## 2024-01-21 DIAGNOSIS — E663 Overweight: Secondary | ICD-10-CM | POA: Insufficient documentation

## 2024-01-21 DIAGNOSIS — Z7689 Persons encountering health services in other specified circumstances: Secondary | ICD-10-CM | POA: Insufficient documentation

## 2024-01-21 NOTE — Assessment & Plan Note (Signed)
 Management per specialist.   On letrozole daily.

## 2024-01-21 NOTE — Assessment & Plan Note (Addendum)
 She struggles with weight loss despite improved diet and exercise, walking at least three days a week. Discussed potential use of Contrave, which includes Wellbutrin and naltrexone, for weight management. She is open to trying it, noting potential constipation and need to check insurance coverage. - Check insurance coverage for Contrave - Consider transitioning to Contrave for weight management if covered by insurance. Drop wellbutrin once daily x 1 week then discontinue as bupropion is contained in contrave.

## 2024-01-21 NOTE — Assessment & Plan Note (Signed)
 She struggles with weight loss despite improved diet and exercise, walking at least three days a week. Discussed potential use of Contrave, which includes Wellbutrin and naltrexone, for weight management. She is open to trying it, noting potential constipation and need to check insurance coverage. - Check insurance coverage for Contrave - Consider transitioning to Contrave for weight management if covered by insurance. Drop wellbutrin once daily x 1 week then discontinue as bupropion is contained in contrave.

## 2024-01-30 ENCOUNTER — Other Ambulatory Visit (HOSPITAL_BASED_OUTPATIENT_CLINIC_OR_DEPARTMENT_OTHER): Payer: Self-pay

## 2024-01-31 ENCOUNTER — Telehealth: Payer: Self-pay

## 2024-01-31 NOTE — Telephone Encounter (Addendum)
 PA submitted for contrave via covermymeds 01/18/2024  [id number]. Per Ariel, with [employer] they have not received the PA. Initiated PA via phone Authorization [id number]. Can take 24-72 hours for approval or denial.

## 2024-02-02 ENCOUNTER — Encounter: Payer: Self-pay | Admitting: Family Medicine

## 2024-02-02 ENCOUNTER — Other Ambulatory Visit: Payer: Self-pay | Admitting: Family Medicine

## 2024-02-02 ENCOUNTER — Other Ambulatory Visit (HOSPITAL_BASED_OUTPATIENT_CLINIC_OR_DEPARTMENT_OTHER): Payer: Self-pay

## 2024-02-02 ENCOUNTER — Ambulatory Visit: Admitting: Family Medicine

## 2024-02-02 DIAGNOSIS — N3000 Acute cystitis without hematuria: Secondary | ICD-10-CM

## 2024-02-02 LAB — POCT URINALYSIS DIP (CLINITEK)
Bilirubin, UA: NEGATIVE
Blood, UA: NEGATIVE
Glucose, UA: NEGATIVE mg/dL
Ketones, POC UA: NEGATIVE mg/dL
Nitrite, UA: NEGATIVE
POC PROTEIN,UA: NEGATIVE
Spec Grav, UA: >=1.03 — AB (ref 1.010–1.025)
Urobilinogen, UA: 0.2 U/dL
pH, UA: 6 (ref 5.0–8.0)

## 2024-02-02 MED ORDER — CIPROFLOXACIN HCL 500 MG PO TABS
500.0000 mg | ORAL_TABLET | Freq: Two times a day (BID) | ORAL | 0 refills | Status: AC
Start: 2024-02-02 — End: 2024-02-09
  Filled 2024-02-02: qty 14, 7d supply, fill #0

## 2024-02-02 MED ORDER — PHENAZOPYRIDINE HCL 200 MG PO TABS
200.0000 mg | ORAL_TABLET | Freq: Three times a day (TID) | ORAL | 0 refills | Status: DC | PRN
  Filled 2024-02-02: qty 20, 7d supply, fill #0

## 2024-02-02 NOTE — Progress Notes (Signed)
   Nurse visit Patient is in today for UTI. States left kidney hurts a lot, right kidney a little since yesterday. Has had some dysuria since Tuesday. Has taken macrobid  BID since Tuesday.  Assessment & Plan:  Acute cystitis without hematuria -     POCT URINALYSIS DIP (CLINITEK) -     Urine Culture     No orders of the defined types were placed in this encounter.   Orders Placed This Encounter  Procedures   Urine Culture   POCT URINALYSIS DIP (CLINITEK)     Follow-up: No follow-ups on file.  An After Visit Summary was printed and given to the patient.  Mercy Stall, MD [facility] [phone]

## 2024-02-03 ENCOUNTER — Other Ambulatory Visit (HOSPITAL_BASED_OUTPATIENT_CLINIC_OR_DEPARTMENT_OTHER): Payer: Self-pay

## 2024-02-04 LAB — URINE CULTURE

## 2024-02-05 NOTE — Assessment & Plan Note (Signed)
 Check UA/Urine culture.  Start cipro  500 mg twice daily x 7 days.

## 2024-02-06 DIAGNOSIS — Z170 Estrogen receptor positive status [ER+]: Secondary | ICD-10-CM | POA: Diagnosis not present

## 2024-02-06 DIAGNOSIS — H469 Unspecified optic neuritis: Secondary | ICD-10-CM | POA: Diagnosis not present

## 2024-02-06 DIAGNOSIS — Z809 Family history of malignant neoplasm, unspecified: Secondary | ICD-10-CM | POA: Diagnosis not present

## 2024-02-06 DIAGNOSIS — C50411 Malignant neoplasm of upper-outer quadrant of right female breast: Secondary | ICD-10-CM | POA: Diagnosis not present

## 2024-02-06 NOTE — Telephone Encounter (Signed)
 PA for contrave approved. See media for additional refills.

## 2024-02-07 ENCOUNTER — Other Ambulatory Visit (HOSPITAL_BASED_OUTPATIENT_CLINIC_OR_DEPARTMENT_OTHER): Payer: Self-pay

## 2024-02-07 ENCOUNTER — Other Ambulatory Visit: Payer: Self-pay

## 2024-02-09 ENCOUNTER — Other Ambulatory Visit: Payer: Self-pay

## 2024-02-13 ENCOUNTER — Other Ambulatory Visit (HOSPITAL_BASED_OUTPATIENT_CLINIC_OR_DEPARTMENT_OTHER): Payer: Self-pay

## 2024-02-13 MED ORDER — CONTRAVE 8-90 MG PO TB12
2.0000 | ORAL_TABLET | Freq: Two times a day (BID) | ORAL | 0 refills | Status: DC
  Filled 2024-02-13: qty 120, 30d supply, fill #0

## 2024-02-16 ENCOUNTER — Encounter: Payer: Self-pay | Admitting: Family Medicine

## 2024-02-16 ENCOUNTER — Ambulatory Visit (HOSPITAL_BASED_OUTPATIENT_CLINIC_OR_DEPARTMENT_OTHER)
Admission: RE | Admit: 2024-02-16 | Discharge: 2024-02-16 | Disposition: A | Discharge status: Home / Self Care | Source: Ambulatory Visit | Attending: Adult Health | Admitting: Adult Health

## 2024-02-16 DIAGNOSIS — Z780 Asymptomatic menopausal state: Secondary | ICD-10-CM | POA: Diagnosis not present

## 2024-02-16 DIAGNOSIS — Z79811 Long term (current) use of aromatase inhibitors: Secondary | ICD-10-CM | POA: Diagnosis not present

## 2024-02-16 DIAGNOSIS — Z5181 Encounter for therapeutic drug level monitoring: Secondary | ICD-10-CM | POA: Diagnosis not present

## 2024-02-17 ENCOUNTER — Other Ambulatory Visit (HOSPITAL_BASED_OUTPATIENT_CLINIC_OR_DEPARTMENT_OTHER): Payer: 59 | Admitting: Radiology

## 2024-02-21 ENCOUNTER — Ambulatory Visit: Payer: Self-pay | Admitting: *Deleted

## 2024-02-21 NOTE — Telephone Encounter (Signed)
-----   Message from Conway Dennis sent at 02/17/2024 11:01 AM EDT ----- Bone density is normal.  Please give patient the good news. ----- Message ----- From: Interface, Rad Results In Sent: 02/16/2024   3:07 PM EDT To: Percival Brace, NP

## 2024-02-21 NOTE — Telephone Encounter (Signed)
 Per Alwin Baars, NP called pt and left message on vm with message below. Advised to call office for questions

## 2024-03-06 NOTE — Telephone Encounter (Signed)
 Copied from CRM [id number]. Topic: Medical Record Request - Provider/Facility Request >> Mar 06, 2024 11:17 AM Rosaria Common wrote: Reason for CRM: Ezequiel Holm from [facility] calling to verify if prior auth questionnaire form has been received by clinic on 5/12, 5/14, and 5/16. Return fax number is [phone]. Called clinic to verify. Prior auth received, but pt declined due to financial reasons.

## 2024-03-14 ENCOUNTER — Encounter (HOSPITAL_BASED_OUTPATIENT_CLINIC_OR_DEPARTMENT_OTHER): Payer: Self-pay

## 2024-03-15 ENCOUNTER — Other Ambulatory Visit (HOSPITAL_BASED_OUTPATIENT_CLINIC_OR_DEPARTMENT_OTHER): Payer: Self-pay

## 2024-04-04 ENCOUNTER — Other Ambulatory Visit (HOSPITAL_BASED_OUTPATIENT_CLINIC_OR_DEPARTMENT_OTHER): Payer: Self-pay

## 2024-04-04 ENCOUNTER — Other Ambulatory Visit: Payer: Self-pay | Admitting: Family Medicine

## 2024-04-04 MED ORDER — PHENAZOPYRIDINE HCL 200 MG PO TABS
200.0000 mg | ORAL_TABLET | Freq: Three times a day (TID) | ORAL | 0 refills | Status: DC | PRN
  Filled 2024-04-04: qty 20, 7d supply, fill #0

## 2024-04-05 ENCOUNTER — Other Ambulatory Visit: Payer: Self-pay | Admitting: Family Medicine

## 2024-04-05 ENCOUNTER — Other Ambulatory Visit (HOSPITAL_BASED_OUTPATIENT_CLINIC_OR_DEPARTMENT_OTHER): Payer: Self-pay

## 2024-04-05 DIAGNOSIS — N3001 Acute cystitis with hematuria: Secondary | ICD-10-CM

## 2024-04-05 LAB — POCT URINALYSIS DIP (CLINITEK)
Bilirubin, UA: NEGATIVE
Glucose, UA: NEGATIVE mg/dL
Ketones, POC UA: NEGATIVE mg/dL
Nitrite, UA: NEGATIVE
POC PROTEIN,UA: NEGATIVE
Spec Grav, UA: >=1.03 — AB (ref 1.010–1.025)
Urobilinogen, UA: 0.2 U/dL
pH, UA: 5.5 (ref 5.0–8.0)

## 2024-04-05 MED ORDER — CIPROFLOXACIN HCL 500 MG PO TABS
500.0000 mg | ORAL_TABLET | Freq: Two times a day (BID) | ORAL | 0 refills | Status: AC
  Filled 2024-04-05: qty 14, 7d supply, fill #0

## 2024-04-07 LAB — URINE CULTURE: Organism ID, Bacteria: NO GROWTH

## 2024-04-08 ENCOUNTER — Ambulatory Visit: Payer: Self-pay | Admitting: Family Medicine

## 2024-04-09 ENCOUNTER — Ambulatory Visit (HOSPITAL_BASED_OUTPATIENT_CLINIC_OR_DEPARTMENT_OTHER)
Admission: RE | Admit: 2024-04-09 | Discharge: 2024-04-09 | Disposition: A | Discharge status: Home / Self Care | Source: Ambulatory Visit | Attending: Family Medicine | Admitting: Family Medicine

## 2024-04-09 ENCOUNTER — Encounter: Payer: Self-pay | Admitting: Family Medicine

## 2024-04-09 ENCOUNTER — Ambulatory Visit: Payer: Self-pay | Admitting: Family Medicine

## 2024-04-09 ENCOUNTER — Ambulatory Visit (INDEPENDENT_AMBULATORY_CARE_PROVIDER_SITE_OTHER): Admitting: Family Medicine

## 2024-04-09 VITALS — BP 110/80 | HR 73 | Temp 97.4°F | Resp 14 | Ht 64.5 in | Wt 164.0 lb

## 2024-04-09 DIAGNOSIS — R310 Gross hematuria: Secondary | ICD-10-CM

## 2024-04-09 DIAGNOSIS — K760 Fatty (change of) liver, not elsewhere classified: Secondary | ICD-10-CM | POA: Diagnosis not present

## 2024-04-09 DIAGNOSIS — Z90710 Acquired absence of both cervix and uterus: Secondary | ICD-10-CM | POA: Diagnosis not present

## 2024-04-09 DIAGNOSIS — R319 Hematuria, unspecified: Secondary | ICD-10-CM | POA: Diagnosis not present

## 2024-04-09 DIAGNOSIS — R109 Unspecified abdominal pain: Secondary | ICD-10-CM | POA: Diagnosis not present

## 2024-04-09 LAB — POCT URINALYSIS DIP (CLINITEK)
Bilirubin, UA: NEGATIVE
Glucose, UA: NEGATIVE mg/dL
Ketones, POC UA: NEGATIVE mg/dL
Leukocytes, UA: NEGATIVE
Nitrite, UA: NEGATIVE
Spec Grav, UA: 1.02 (ref 1.010–1.025)
Urobilinogen, UA: 1 U/dL
pH, UA: 6 (ref 5.0–8.0)

## 2024-04-09 NOTE — Progress Notes (Signed)
 Acute Office Visit  Subjective:    Patient ID: Sheri Moore, female    DOB: [birth year], 43 y.o.   MRN: [id number]  Chief Complaint  Patient presents with   Hematuria    HPI: Patient is in today for persistent dysuria, left flank pain and hematuria for 6 days. Treated with cipro  for possible UTI Friday. No improvement in symptoms and culture grew nothing. Repeat UA today showed  trace blood.  Azo helped some.   Past Medical History:  Diagnosis Date   Anxiety    Breast cancer (HCC) 07/06/21   Right Breast, first seen on mammogram done 07/06/21   Depression    Family history of breast cancer    Family history of melanoma    Family history of uterine cancer    Fibroid    Gastroschisis    Left arm numbness 10/17/2018   Left arm weakness 10/17/2018    Past Surgical History:  Procedure Laterality Date   ABDOMINAL HYSTERECTOMY     supracervical hysterectomy   ABDOMINAL SURGERY     as a newborn, gastrogheschesis   BREAST LUMPECTOMY Right 09/10/2021   BREAST LUMPECTOMY WITH RADIOACTIVE SEED AND SENTINEL LYMPH NODE BIOPSY Right 09/10/2021   Procedure: RIGHT BREAST LUMPECTOMY WITH RADIOACTIVE SEED AND SENTINEL LYMPH NODE BIOPSY;  Surgeon: Aron Shoulders, MD;  Location: [facility] OR;  Service: General;  Laterality: Right;   OOPHORECTOMY      Family History  Problem Relation Age of Onset   Uterine cancer Mother 64   Diabetes Mother    Hypertension Mother    Thyroid  disease Mother    Obesity Mother    Subarachnoid hemorrhage Father    Melanoma Father 56   Diabetes Father    Diabetes Sister    Obesity Sister    Diabetes Maternal Grandmother    Hypertension Maternal Grandmother    Melanoma Paternal Grandfather 7   Diabetes Paternal Grandfather    Breast cancer Other 51       MGMs sister   Breast cancer Other 51       MGF's sister   Breast cancer Other        dx < 50; PGMs sister    Social History   Socioeconomic History   Marital status: Married    Spouse name: Not  on file   Number of children: 1   Years of education: College   Highest education level: Associate degree: occupational, Scientist, product/process development, or vocational program  Occupational History   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, fashion/clothing  Tobacco Use   Smoking status: Former    Current packs/day: 0.00    Average packs/day: 0.3 packs/day for 3.0 years (0.8 ttl pk-yrs)    Types: Cigarettes    Start date: 10/11/1998    Quit date: 10/11/2001    Years since quitting: 22.5   Smokeless tobacco: Former  Building services engineer status: Never Used  Substance and Sexual Activity   Alcohol use: Not Currently    Comment: occ   Drug use: No   Sexual activity: Yes    Partners: Male    Birth control/protection: Surgical    Comment: supracervical hysterectomy, menarche 43yo, sexual debut 43yo  Other Topics Concern   Not on file  Social History Narrative   Lives with husband and son   Caffeine use: rarely   Right handed    Social Drivers of Health   Financial Resource Strain: Low Risk  (10/07/2023)   Overall Financial Resource Strain (CARDIA)    Difficulty  of Paying Living Expenses: Not hard at all  Food Insecurity: No Food Insecurity (10/07/2023)   Hunger Vital Sign    Worried About Running Out of Food in the Last Year: Never true    Ran Out of Food in the Last Year: Never true  Transportation Needs: No Transportation Needs (10/07/2023)   PRAPARE - Administrator, Civil Service (Medical): No    Lack of Transportation (Non-Medical): No  Physical Activity: Insufficiently Active (10/07/2023)   Exercise Vital Sign    Days of Exercise per Week: 3 days    Minutes of Exercise per Session: 20 min  Stress: No Stress Concern Present (10/07/2023)   [employer] of Occupational Health - Occupational Stress Questionnaire    Feeling of Stress : Only a little  Social Connections: Moderately Integrated (10/07/2023)   Social Connection and Isolation Panel    Frequency of Communication with Friends and Family: More  than three times a week    Frequency of Social Gatherings with Friends and Family: Three times a week    Attends Religious Services: More than 4 times per year    Active Member of Clubs or Organizations: No    Attends Banker Meetings: Not on file    Marital Status: Married  Intimate Partner Violence: Not At Risk (02/02/2024)   Humiliation, Afraid, Rape, and Kick questionnaire    Fear of Current or Ex-Partner: No    Emotionally Abused: No    Physically Abused: No    Sexually Abused: No    Outpatient Medications Prior to Visit  Medication Sig Dispense Refill   ciprofloxacin  (CIPRO ) 500 MG tablet Take 1 tablet (500 mg total) by mouth 2 (two) times daily for 7 days. 14 tablet 0   fluticasone  (FLONASE ) 50 MCG/ACT nasal spray Place 2 sprays into both nostrils daily. 42 g 3   gabapentin  (NEURONTIN ) 300 MG capsule Take 1 capsule (300 mg total) by mouth 3 (three) times daily. 270 capsule 1   letrozole  (FEMARA ) 2.5 MG tablet Take 1 tablet (2.5 mg total) by mouth daily. 90 tablet 1   lisdexamfetamine (VYVANSE ) 40 MG capsule Take 1 capsule (40 mg total) by mouth every morning. 30 capsule 0   loratadine (CLARITIN) 10 MG tablet Take 10 mg by mouth daily.     LORazepam  (ATIVAN ) 0.5 MG tablet Take 1 tablet (0.5 mg total) by mouth every 8 (eight) hours. 30 tablet 2   metoprolol  succinate (TOPROL -XL) 25 MG 24 hr tablet Take 1 tablet (25 mg total) by mouth daily. 90 tablet 1   Naltrexone -buPROPion  HCl ER (CONTRAVE ) 8-90 MG TB12 Take 2 tablets by mouth 2 (two) times daily. 360 tablet 0   Naltrexone -buPROPion  HCl ER 8-90 MG TB12 Take 2 tablets by mouth in the morning and at bedtime.     ondansetron  (ZOFRAN ) 4 MG tablet Take 1 tablet (4 mg total) by mouth every 8 (eight) hours as needed for nausea or vomiting. 30 tablet 0   phenazopyridine  (PYRIDIUM ) 200 MG tablet Take 1 tablet (200 mg total) by mouth 3 (three) times daily as needed for pain. 20 tablet 0   rosuvastatin  (CRESTOR ) 5 MG tablet Take 1  tablet (5 mg total) by mouth daily. 90 tablet 1   No facility-administered medications prior to visit.    No Known Allergies  Review of Systems  Constitutional:  Negative for chills and fever.  Gastrointestinal:  Negative for abdominal pain, nausea and vomiting.  Genitourinary:  Positive for dysuria and hematuria.  Objective:        01/17/2024   12:05 PM 10/07/2023    8:10 AM 07/05/2023    2:25 PM  Vitals with BMI  Height 5' 4.5 5' 4.5   Weight 169 lbs 169 lbs 162 lbs 3 oz  BMI 28.57 28.57   Systolic 117 122 878  Diastolic 74 84 75  Pulse 64 90 84    No data found.   Physical Exam Vitals reviewed.  Constitutional:      Appearance: Normal appearance. She is normal weight.   Cardiovascular:     Rate and Rhythm: Normal rate and regular rhythm.     Heart sounds: Normal heart sounds.  Pulmonary:     Effort: Pulmonary effort is normal. No respiratory distress.     Breath sounds: Normal breath sounds.  Abdominal:     General: Abdomen is flat. Bowel sounds are normal.     Palpations: Abdomen is soft.     Tenderness: There is no abdominal tenderness.   Neurological:     Mental Status: She is alert and oriented to person, place, and time.   Psychiatric:        Mood and Affect: Mood normal.        Behavior: Behavior normal.     Health Maintenance Due  Topic Date Due   Pneumococcal Vaccine 81-49 Years old (1 of 2 - PCV) Never done   Hepatitis B Vaccines (1 of 3 - 19+ 3-dose series) Never done   HPV VACCINES (1 - Risk 3-dose SCDM series) Never done   COVID-19 Vaccine (4 - 2024-25 season) 06/12/2023       Topic Date Due   Hepatitis B Vaccines (1 of 3 - 19+ 3-dose series) Never done   HPV VACCINES (1 - Risk 3-dose SCDM series) Never done     Lab Results  Component Value Date   TSH 2.690 05/25/2023   Lab Results  Component Value Date   WBC 5.4 01/16/2024   HGB 13.5 01/16/2024   HCT 40.8 01/16/2024   MCV 88 01/16/2024   PLT 252 01/16/2024   Lab  Results  Component Value Date   NA 145 (H) 01/16/2024   K 4.4 01/16/2024   CO2 22 01/16/2024   GLUCOSE 100 (H) 01/16/2024   BUN 14 01/16/2024   CREATININE 0.74 01/16/2024   BILITOT 0.2 01/16/2024   ALKPHOS 135 (H) 01/16/2024   AST 27 01/16/2024   ALT 31 01/16/2024   PROT 6.4 01/16/2024   ALBUMIN 4.4 01/16/2024   CALCIUM  9.5 01/16/2024   ANIONGAP 8 07/05/2023   EGFR 102 01/16/2024   Lab Results  Component Value Date   CHOL 137 01/16/2024   Lab Results  Component Value Date   HDL 49 01/16/2024   Lab Results  Component Value Date   LDLCALC 70 01/16/2024   Lab Results  Component Value Date   TRIG 97 01/16/2024   Lab Results  Component Value Date   CHOLHDL 2.8 01/16/2024   Lab Results  Component Value Date   HGBA1C 5.7 (H) 01/16/2024       Assessment & Plan:  Gross hematuria Assessment & Plan: Check UA.  Order ct urogram.  Order labs.   Orders: -     CBC with Differential/Platelet -     Comprehensive metabolic panel with GFR -     CT RENAL STONE STUDY; Future  Left flank pain Assessment & Plan: Check UA.  Order ct urogram.  Order labs.   Orders: -  CT RENAL STONE STUDY; Future     No orders of the defined types were placed in this encounter.   Orders Placed This Encounter  Procedures   CT RENAL STONE STUDY   CBC with Differential/Platelet   Comprehensive metabolic panel with GFR     Follow-up: No follow-ups on file.  An After Visit Summary was printed and given to the patient.  Abigail Free, MD [facility] [phone]

## 2024-04-09 NOTE — Assessment & Plan Note (Signed)
 Check UA.  Order ct urogram.  Order labs.

## 2024-04-10 LAB — CBC WITH DIFFERENTIAL/PLATELET
Basophils Absolute: 0 10*3/uL (ref 0.0–0.2)
Basos: 1 %
EOS (ABSOLUTE): 0 10*3/uL (ref 0.0–0.4)
Eos: 1 %
Hematocrit: 39 % (ref 34.0–46.6)
Hemoglobin: 13.1 g/dL (ref 11.1–15.9)
Immature Grans (Abs): 0 10*3/uL (ref 0.0–0.1)
Immature Granulocytes: 0 %
Lymphocytes Absolute: 1.6 10*3/uL (ref 0.7–3.1)
Lymphs: 30 %
MCH: 29.2 pg (ref 26.6–33.0)
MCHC: 33.6 g/dL (ref 31.5–35.7)
MCV: 87 fL (ref 79–97)
Monocytes Absolute: 0.3 10*3/uL (ref 0.1–0.9)
Monocytes: 6 %
Neutrophils Absolute: 3.3 10*3/uL (ref 1.4–7.0)
Neutrophils: 62 %
Platelets: 260 10*3/uL (ref 150–450)
RBC: 4.48 x10E6/uL (ref 3.77–5.28)
RDW: 12.9 % (ref 11.7–15.4)
WBC: 5.3 10*3/uL (ref 3.4–10.8)

## 2024-04-10 LAB — COMPREHENSIVE METABOLIC PANEL WITH GFR
ALT: 51 IU/L — ABNORMAL HIGH (ref 0–32)
AST: 37 IU/L (ref 0–40)
Albumin: 4.6 g/dL (ref 3.9–4.9)
Alkaline Phosphatase: 90 IU/L (ref 44–121)
BUN/Creatinine Ratio: 25 — ABNORMAL HIGH (ref 9–23)
BUN: 17 mg/dL (ref 6–24)
Bilirubin Total: 0.6 mg/dL (ref 0.0–1.2)
CO2: 19 mmol/L — ABNORMAL LOW (ref 20–29)
Calcium: 9.5 mg/dL (ref 8.7–10.2)
Chloride: 104 mmol/L (ref 96–106)
Creatinine, Ser: 0.68 mg/dL (ref 0.57–1.00)
Globulin, Total: 2 g/dL (ref 1.5–4.5)
Glucose: 92 mg/dL (ref 70–99)
Potassium: 4.2 mmol/L (ref 3.5–5.2)
Sodium: 141 mmol/L (ref 134–144)
Total Protein: 6.6 g/dL (ref 6.0–8.5)
eGFR: 110 mL/min/{1.73_m2} (ref >59)

## 2024-04-16 ENCOUNTER — Other Ambulatory Visit: Payer: Self-pay | Admitting: Family Medicine

## 2024-04-16 MED ORDER — LISDEXAMFETAMINE DIMESYLATE 40 MG PO CAPS
40.0000 mg | ORAL_CAPSULE | ORAL | 0 refills | Status: DC
  Filled 2024-04-16: qty 30, 30d supply, fill #0

## 2024-04-17 ENCOUNTER — Other Ambulatory Visit (HOSPITAL_BASED_OUTPATIENT_CLINIC_OR_DEPARTMENT_OTHER): Payer: Self-pay

## 2024-05-20 ENCOUNTER — Other Ambulatory Visit: Payer: Self-pay | Admitting: Nurse Practitioner

## 2024-05-20 ENCOUNTER — Other Ambulatory Visit: Payer: Self-pay | Admitting: Family Medicine

## 2024-05-20 DIAGNOSIS — R002 Palpitations: Secondary | ICD-10-CM

## 2024-05-21 ENCOUNTER — Other Ambulatory Visit (HOSPITAL_BASED_OUTPATIENT_CLINIC_OR_DEPARTMENT_OTHER): Payer: Self-pay

## 2024-05-21 MED ORDER — LETROZOLE 2.5 MG PO TABS
2.5000 mg | ORAL_TABLET | Freq: Every day | ORAL | 1 refills | Status: AC
  Filled 2024-05-21: qty 90, 90d supply, fill #0
  Filled 2024-09-02: qty 90, 90d supply, fill #1

## 2024-05-22 ENCOUNTER — Other Ambulatory Visit (HOSPITAL_BASED_OUTPATIENT_CLINIC_OR_DEPARTMENT_OTHER): Payer: Self-pay

## 2024-05-22 MED ORDER — METOPROLOL SUCCINATE ER 25 MG PO TB24
25.0000 mg | ORAL_TABLET | Freq: Every day | ORAL | 1 refills | Status: DC
  Filled 2024-05-22: qty 90, 90d supply, fill #0
  Filled 2024-09-02: qty 90, 90d supply, fill #1

## 2024-05-22 MED ORDER — LISDEXAMFETAMINE DIMESYLATE 40 MG PO CAPS
40.0000 mg | ORAL_CAPSULE | ORAL | 0 refills | Status: DC
  Filled 2024-05-22: qty 30, 30d supply, fill #0

## 2024-06-13 ENCOUNTER — Encounter: Payer: Self-pay | Admitting: Hematology

## 2024-06-14 ENCOUNTER — Other Ambulatory Visit: Payer: Self-pay | Admitting: Nurse Practitioner

## 2024-06-14 DIAGNOSIS — C50111 Malignant neoplasm of central portion of right female breast: Secondary | ICD-10-CM

## 2024-06-25 ENCOUNTER — Other Ambulatory Visit: Payer: Self-pay | Admitting: Family Medicine

## 2024-06-25 MED ORDER — LISDEXAMFETAMINE DIMESYLATE 40 MG PO CAPS
40.0000 mg | ORAL_CAPSULE | ORAL | 0 refills | Status: DC
  Filled 2024-06-25: qty 30, 30d supply, fill #0

## 2024-06-26 ENCOUNTER — Other Ambulatory Visit (HOSPITAL_BASED_OUTPATIENT_CLINIC_OR_DEPARTMENT_OTHER): Payer: Self-pay

## 2024-07-03 ENCOUNTER — Ambulatory Visit (INDEPENDENT_AMBULATORY_CARE_PROVIDER_SITE_OTHER)

## 2024-07-03 DIAGNOSIS — Z23 Encounter for immunization: Secondary | ICD-10-CM | POA: Diagnosis not present

## 2024-07-30 ENCOUNTER — Other Ambulatory Visit: Payer: Self-pay | Admitting: Family Medicine

## 2024-07-30 MED ORDER — ROSUVASTATIN CALCIUM 5 MG PO TABS
5.0000 mg | ORAL_TABLET | Freq: Every day | ORAL | 1 refills | Status: DC
  Filled 2024-07-30: qty 90, 90d supply, fill #0

## 2024-07-30 MED ORDER — LISDEXAMFETAMINE DIMESYLATE 40 MG PO CAPS
40.0000 mg | ORAL_CAPSULE | ORAL | 0 refills | Status: DC
  Filled 2024-07-30: qty 30, 30d supply, fill #0

## 2024-07-31 ENCOUNTER — Other Ambulatory Visit (HOSPITAL_BASED_OUTPATIENT_CLINIC_OR_DEPARTMENT_OTHER): Payer: Self-pay

## 2024-08-05 NOTE — Assessment & Plan Note (Signed)
Stage IA, p(T1c, N0), ER+/PR+/HER2-, Grade 2, RS 22 -found on screening mammogram. Biopsy 08/04/21 confirmed invasive ductal carcinoma with DCIS. -right lumpectomy on 09/10/21 under Dr. Barry Dienes showed 1.2 cm IDC, grade 3, with small foci of DCIS. -Oncotype RS of 22, low risk.   -s/p adjuvant radiation under Dr. Lisbeth Renshaw, 11/03/21 - 12/17/21.   -she began letrozole in late 12/2021. She is tolerating well with no noticeable side effects. -she is clinically doing well, no new concerns. Labs reviewed, all WNL. Physical exam was unremarkable. There is no clinical concern for recurrence. -Her last diagnostic mammogram in October 2023 was benign, she did have additional left-sided diagnostic mammogram in December 2023 which showed a lipoma at the 4 o'clock position, this will be followed in 6 months by mammogram again

## 2024-08-06 ENCOUNTER — Other Ambulatory Visit: Payer: Self-pay

## 2024-08-06 ENCOUNTER — Inpatient Hospital Stay: Attending: Adult Health

## 2024-08-06 ENCOUNTER — Inpatient Hospital Stay: Admitting: Hematology

## 2024-08-06 VITALS — BP 130/78 | HR 81 | Temp 98.2°F | Resp 19 | Ht 64.5 in | Wt 166.6 lb

## 2024-08-06 DIAGNOSIS — M2550 Pain in unspecified joint: Secondary | ICD-10-CM | POA: Diagnosis not present

## 2024-08-06 DIAGNOSIS — C50111 Malignant neoplasm of central portion of right female breast: Secondary | ICD-10-CM

## 2024-08-06 DIAGNOSIS — Z1732 Human epidermal growth factor receptor 2 negative status: Secondary | ICD-10-CM | POA: Insufficient documentation

## 2024-08-06 DIAGNOSIS — Z170 Estrogen receptor positive status [ER+]: Secondary | ICD-10-CM | POA: Diagnosis not present

## 2024-08-06 DIAGNOSIS — L989 Disorder of the skin and subcutaneous tissue, unspecified: Secondary | ICD-10-CM | POA: Diagnosis not present

## 2024-08-06 DIAGNOSIS — Z79899 Other long term (current) drug therapy: Secondary | ICD-10-CM | POA: Insufficient documentation

## 2024-08-06 DIAGNOSIS — Z79811 Long term (current) use of aromatase inhibitors: Secondary | ICD-10-CM | POA: Diagnosis not present

## 2024-08-06 DIAGNOSIS — Z803 Family history of malignant neoplasm of breast: Secondary | ICD-10-CM | POA: Insufficient documentation

## 2024-08-06 DIAGNOSIS — Z1721 Progesterone receptor positive status: Secondary | ICD-10-CM | POA: Diagnosis not present

## 2024-08-06 DIAGNOSIS — Z923 Personal history of irradiation: Secondary | ICD-10-CM | POA: Diagnosis not present

## 2024-08-06 DIAGNOSIS — Z808 Family history of malignant neoplasm of other organs or systems: Secondary | ICD-10-CM | POA: Insufficient documentation

## 2024-08-06 DIAGNOSIS — C50919 Malignant neoplasm of unspecified site of unspecified female breast: Secondary | ICD-10-CM

## 2024-08-06 LAB — CMP (CANCER CENTER ONLY)
ALT: 55 U/L — ABNORMAL HIGH (ref 0–44)
AST: 38 U/L (ref 15–41)
Albumin: 4.7 g/dL (ref 3.5–5.0)
Alkaline Phosphatase: 70 U/L (ref 38–126)
Anion gap: 6 (ref 5–15)
BUN: 16 mg/dL (ref 6–20)
CO2: 29 mmol/L (ref 22–32)
Calcium: 9.8 mg/dL (ref 8.9–10.3)
Chloride: 105 mmol/L (ref 98–111)
Creatinine: 0.69 mg/dL (ref 0.44–1.00)
GFR, Estimated: >60 mL/min (ref >60)
Glucose, Bld: 106 mg/dL — ABNORMAL HIGH (ref 70–99)
Potassium: 4.2 mmol/L (ref 3.5–5.1)
Sodium: 140 mmol/L (ref 135–145)
Total Bilirubin: 0.8 mg/dL (ref 0.0–1.2)
Total Protein: 7.2 g/dL (ref 6.5–8.1)

## 2024-08-06 LAB — CBC WITH DIFFERENTIAL (CANCER CENTER ONLY)
Abs Immature Granulocytes: 0.01 K/uL (ref 0.00–0.07)
Basophils Absolute: 0 K/uL (ref 0.0–0.1)
Basophils Relative: 1 %
Eosinophils Absolute: 0 K/uL (ref 0.0–0.5)
Eosinophils Relative: 1 %
HCT: 39.5 % (ref 36.0–46.0)
Hemoglobin: 13.8 g/dL (ref 12.0–15.0)
Immature Granulocytes: 0 %
Lymphocytes Relative: 27 %
Lymphs Abs: 1.4 K/uL (ref 0.7–4.0)
MCH: 28.5 pg (ref 26.0–34.0)
MCHC: 34.9 g/dL (ref 30.0–36.0)
MCV: 81.4 fL (ref 80.0–100.0)
Monocytes Absolute: 0.3 K/uL (ref 0.1–1.0)
Monocytes Relative: 6 %
Neutro Abs: 3.4 K/uL (ref 1.7–7.7)
Neutrophils Relative %: 65 %
Platelet Count: 259 K/uL (ref 150–400)
RBC: 4.85 MIL/uL (ref 3.87–5.11)
RDW: 12.4 % (ref 11.5–15.5)
WBC Count: 5.2 K/uL (ref 4.0–10.5)
nRBC: 0 % (ref 0.0–0.2)

## 2024-08-06 NOTE — Progress Notes (Signed)
 [facility]   Telephone:([phone] Fax:([phone]) [phone]   Clinic Follow up Note   Patient Care Team: Sherre Clapper, MD as PCP - General (Family Medicine) Lanny Callander, MD as Consulting Physician (Hematology) Aron Shoulders, MD as Consulting Physician (General Surgery) Dewey Rush, MD as Consulting Physician (Radiation Oncology)  Date of Service:  08/06/2024  CHIEF COMPLAINT: f/u of right breast cancer  CURRENT THERAPY:  Adjuvant letrozole   Oncology History   Cancer of central portion of right breast (HCC) Stage IA, p(T1c, N0), ER+/PR+/HER2-, Grade 2, RS 22 -found on screening mammogram. Biopsy 08/04/21 confirmed invasive ductal carcinoma with DCIS. -right lumpectomy on 09/10/21 under Dr. Aron showed 1.2 cm IDC, grade 3, with small foci of DCIS. -Oncotype RS of 22, low risk.   -s/p adjuvant radiation under Dr. Dewey, 11/03/21 - 12/17/21.   -she began letrozole  in late 12/2021. She is tolerating well with no noticeable side effects. -she is clinically doing well, no new concerns. Labs reviewed, all WNL. Physical exam was unremarkable. There is no clinical concern for recurrence. -Her last diagnostic mammogram in October 2023 was benign, she did have additional left-sided diagnostic mammogram in December 2023 which showed a lipoma at the 4 o'clock position, this will be followed in 6 months by mammogram again   Assessment & Plan Estrogen receptor positive right breast cancer Diagnosed in 2022, treated with radiation, currently on letrozole . Low oncotype score of 22, no chemotherapy needed. Concerned about recurrence. Discussed ctDNA testing for early detection, covered by most insurances, more sensitive than traditional tumor markers. - Continue letrozole  therapy. - Schedule ctDNA testing (OncoDetect) for next visit in six months. - Alternate follow-up visits with surgeon and oncologist every six months.  Aromatase inhibitor-associated arthralgia Experiencing joint  stiffness and pain, particularly after prolonged walking, a known side effect of aromatase inhibitor therapy. - Encourage regular exercise and staying active to manage joint stiffness and pain.  Risk of decreased bone density due to aromatase inhibitor therapy Bone density scan in May 2025 was normal. Aromatase inhibitor therapy can decrease bone density, necessitating regular monitoring. Weight-bearing exercises advised to maintain bone density. - Repeat bone density scan every two years. - Encourage weight-bearing exercises such as walking with weights or lifting.  Benign skin lesion of right breast post-radiation New skin lesion on right breast post-radiation, appeared rapidly, appears benign, not like a typical skin tag. No dermatologist currently involved. - Monitor the skin lesion for changes in size or bleeding. - Consider referral to a dermatologist if the lesion changes or becomes concerning.  Plan - She is clinically doing very well, will continue letrozole . - She is interested in MRD testing, will order OncoDetect for late this week and continue every 6 months    SUMMARY OF ONCOLOGIC HISTORY: Oncology History Overview Note   Cancer Staging  Cancer of central portion of right breast [facility]) Staging form: Breast, AJCC 8th Edition - Clinical stage from 08/04/2021: Stage IA (cT1b, cN0, cM0, G2, ER+, PR+, HER2-) - Unsigned - Pathologic stage from 09/10/2021: Stage IA (pT1c, pN0, cM0, G2, ER+, PR+, HER2-, Oncotype DX score: 22) - Signed by Lanny Callander, MD on 10/01/2021     Cancer of central portion of right breast (HCC)  07/29/2021 Mammogram   EXAM: DIGITAL DIAGNOSTIC UNILATERAL RIGHT MAMMOGRAM WITH TOMOSYNTHESIS AND CAD; ULTRASOUND RIGHT BREAST LIMITED  IMPRESSION: Suspicious mass in the right breast at 9 o'clock measuring 1.0 cm. Targeted ultrasound performed in the right axilla demonstrating normal lymph nodes.   08/04/2021 Initial Biopsy  Diagnosis Breast, right, needle  core biopsy, 9 o'clock, ribbon shaped clip - INVASIVE DUCTAL CARCINOMA - DUCTAL CARCINOMA IN SITU - SEE COMMENT Microscopic Comment Based on the biopsy, the carcinoma appears Nottingham grade 2 of 3 and measures 0.9 cm in greatest linear extent.  PROGNOSTIC INDICATORS Results: The tumor cells are EQUIVOCAL for Her2 (2+). Her2 by FISH will be performed and results reported separately. Estrogen Receptor: 100%, POSITIVE, STRONG STAINING INTENSITY Progesterone Receptor: 95%, POSITIVE, STRONG STAINING INTENSITY Proliferation Marker Ki67: 10%  FLUORESCENCE IN-SITU HYBRIDIZATION Results: GROUP 5: HER2 **NEGATIVE**   09/01/2021 Initial Diagnosis   Cancer of central portion of right breast (HCC)   09/08/2021 Genetic Testing   Negative genetic testing on the BRCAPlus panel.  The report date is 08/29/2021.  Negative genetic testing on the CancerNext-Expanded+RNAinsight.  DICER1 p.T43M VUS was identified. The report date is 09/08/2021.  The BRCAPlus gene panel offered by [facility] and includes sequencing and rearrangement analysis for the following 6 genes: BRCA1, BRCA2, CDH1, PALB2, PTEN, and TP53.  The CancerNext-Expanded gene panel offered by [facility] and includes sequencing and rearrangement analysis for the following 77 genes: AIP, ALK, APC*, ATM*, AXIN2, BAP1, BARD1, BLM, BMPR1A, BRCA1*, BRCA2*, BRIP1*, CDC73, CDH1*, CDK4, CDKN1B, CDKN2A, CHEK2*, CTNNA1, DICER1, FANCC, FH, FLCN, GALNT12, KIF1B, LZTR1, MAX, MEN1, MET, MLH1*, MSH2*, MSH3, MSH6*, MUTYH*, NBN, NF1*, NF2, NTHL1, PALB2*, PHOX2B, PMS2*, POT1, PRKAR1A, PTCH1, PTEN*, RAD51C*, RAD51D*, RB1, RECQL, RET, SDHA, SDHAF2, SDHB, SDHC, SDHD, SMAD4, SMARCA4, SMARCB1, SMARCE1, STK11, SUFU, TMEM127, TP53*, TSC1, TSC2, VHL and XRCC2 (sequencing and deletion/duplication); EGFR, EGLN1, HOXB13, KIT, MITF, PDGFRA, POLD1, and POLE (sequencing only); EPCAM and GREM1 (deletion/duplication only). DNA and RNA analyses performed for * genes.     09/10/2021 Cancer Staging   Staging form: Breast, AJCC 8th Edition - Pathologic stage from 09/10/2021: Stage IA (pT1c, pN0, cM0, G2, ER+, PR+, HER2-, Oncotype DX score: 22) - Signed by Lanny Callander, MD on 10/01/2021 Stage prefix: Initial diagnosis Multigene prognostic tests performed: Oncotype DX Recurrence score range: Greater than or equal to 11 Histologic grading system: 3 grade system Residual tumor (R): R0 - None   09/10/2021 Definitive Surgery   FINAL MICROSCOPIC DIAGNOSIS:  A. BREAST, RIGHT, LUMPECTOMY:  Invasive ductal carcinoma with clear margins of resection.  INVASIVE CARCINOMA OF THE BREAST:  Resection  Procedure: Lumpectomy.  Specimen Laterality: Right.  Histologic Type: Invasive ductal carcinoma.  Histologic Grade:       Glandular (Acinar)/Tubular Differentiation: 3 out of possible 3.       Nuclear Pleomorphism: 2 out of possible 3.       Mitotic Rate: 1 out of possible 3.       Overall Grade: 2 out of possible 3 (from a score of 6 out of  possible 9)  Tumor Size: 1.2 cm. x 1.0 cm. x 0.8 cm.  Ductal Carcinoma In Situ: Present. Small foci without comedo necrosis.  Tumor Extent: Limited to breast parenchyma.  Treatment Effect in the Breast: No known presurgical therapy  Margins: All margins are negative for invasive carcinoma.       Distance from Closest Margin (mm): 7 mm.       Specify Closest Margin (required only if <30mm): Inferior.  DCIS Margins: Uninvolved by DCIS.       Distance from Closest Margin (mm): 6 mm.       Specify Closest Margin (required only if <16mm): Posterior.  Regional Lymph Nodes:       Number of Lymph Nodes Examined: Five (5).  Number of Sentinel Nodes Examined: Five (5).       Number of Lymph Nodes with Macrometastases (>2 mm): None (0).       Number of Lymph Nodes with Micrometastases: None (0).       Number of Lymph Nodes with Isolated Tumor Cells (=0.2 mm or =200 cells): None (0).       Size of Largest Metastatic Deposit (mm): N.A.        Extranodal Extension: N.A.  Distant Metastasis:       Distant Site(s) Involved: None known.  Breast Biomarker Testing Performed on Previous Biopsy:       Testing Performed on Case Number: [id number] from 08/04/2021.             Estrogen Receptor: 100% (Strong intensity).             Progesterone Receptor: 95% (Strong intensity).             HER2: 2+ by IHC / Negative by FISH.             Ki-67: 10%  Pathologic Stage Classification (pTNM, AJCC 8th Edition): pT1c, pN0  Representative Tumor Block: A3 and A2.  Comment(s): None.   B. LYMPH NODE, RIGHT (5), SENTINEL, EXCISION:  5 lymph nodes with fatty infiltration which is negative for metastatic  Carcinoma. (0/5)    11/03/2021 - 12/17/2021 Radiation Therapy   Site Technique Total Dose (Gy) Dose per Fx (Gy) Completed Fx Beam Energies  Breast, Right: Breast_R 3D 50.4/50.4 1.8 28/28 6X  Breast, Right: Breast_R_Bst 3D 10/10 2 5/5 6X     12/2021 -  Anti-estrogen oral therapy   Letrozole  x 7-10 years      Discussed the use of AI scribe software for clinical note transcription with the patient, who gave verbal consent to proceed.  History of Present Illness Sheri Moore is a 43 year old female with breast cancer who presents for follow-up.  She was diagnosed with breast cancer in 2022 and has been on Letrozole  since then, experiencing hot flashes and joint stiffness, particularly with extensive walking. She reports a new skin lesion on the side where she had radiation, which appeared rapidly a couple of weeks ago. Occasional residual aching pain is present but does not limit her activities.  Her current medications include Letrozole , metoprolol , Crestor , Neurontin  for eye pain, lorazepam  as needed, and allergy medications such as Claritin and Puritan as needed. She takes vitamin D  regularly and has nausea medication but uses it infrequently.  She had a small tumor diagnosed three years ago with an oncotype score of 22, which did  not require chemotherapy, only radiation and ongoing treatment with Letrozole . She had a bone density scan in May and blood tests in June. She alternates her follow-up visits between her oncologist and her surgeon, whom she sees in April. She has a mammogram scheduled for Friday.     All other systems were reviewed with the patient and are negative.  MEDICAL HISTORY:  Past Medical History:  Diagnosis Date   Anxiety    Breast cancer (HCC) 07/06/21   Right Breast, first seen on mammogram done 07/06/21   Depression    Family history of breast cancer    Family history of melanoma    Family history of uterine cancer    Fibroid    Gastroschisis    Left arm numbness 10/17/2018   Left arm weakness 10/17/2018    SURGICAL HISTORY: Past Surgical History:  Procedure Laterality Date  ABDOMINAL HYSTERECTOMY     supracervical hysterectomy   ABDOMINAL SURGERY     as a newborn, gastrogheschesis   BREAST LUMPECTOMY Right 09/10/2021   BREAST LUMPECTOMY WITH RADIOACTIVE SEED AND SENTINEL LYMPH NODE BIOPSY Right 09/10/2021   Procedure: RIGHT BREAST LUMPECTOMY WITH RADIOACTIVE SEED AND SENTINEL LYMPH NODE BIOPSY;  Surgeon: Aron Shoulders, MD;  Location: [facility] OR;  Service: General;  Laterality: Right;   OOPHORECTOMY      I have reviewed the social history and family history with the patient and they are unchanged from previous note.  ALLERGIES:  has no known allergies.  MEDICATIONS:  Current Outpatient Medications  Medication Sig Dispense Refill   fluticasone  (FLONASE ) 50 MCG/ACT nasal spray Place 2 sprays into both nostrils daily. 42 g 3   gabapentin  (NEURONTIN ) 300 MG capsule Take 1 capsule (300 mg total) by mouth 3 (three) times daily. 270 capsule 1   letrozole  (FEMARA ) 2.5 MG tablet Take 1 tablet (2.5 mg total) by mouth daily. 90 tablet 1   lisdexamfetamine (VYVANSE ) 40 MG capsule Take 1 capsule (40 mg total) by mouth every morning. 30 capsule 0   loratadine (CLARITIN) 10 MG tablet Take 10 mg  by mouth daily.     metoprolol  succinate (TOPROL -XL) 25 MG 24 hr tablet Take 1 tablet (25 mg total) by mouth daily. 90 tablet 1   phenazopyridine  (PYRIDIUM ) 200 MG tablet Take 1 tablet (200 mg total) by mouth 3 (three) times daily as needed for pain. 20 tablet 0   rosuvastatin  (CRESTOR ) 5 MG tablet Take 1 tablet (5 mg total) by mouth daily. 90 tablet 1   No current facility-administered medications for this visit.    PHYSICAL EXAMINATION: ECOG PERFORMANCE STATUS: 0 - Asymptomatic  Vitals:   08/06/24 1000  BP: 130/78  Pulse: 81  Resp: 19  Temp: 98.2 F (36.8 C)  SpO2: 95%   Wt Readings from Last 3 Encounters:  08/06/24 166 lb 9.6 oz (75.6 kg)  04/09/24 164 lb (74.4 kg)  01/17/24 169 lb (76.7 kg)     GENERAL:alert, no distress and comfortable SKIN: skin color, texture, turgor are normal, no rashes or significant lesions except a small papular skin lesion below the right breast EYES: normal, Conjunctiva are pink and non-injected, sclera clear NECK: supple, thyroid  normal size, non-tender, without nodularity LYMPH:  no palpable lymphadenopathy in the cervical, axillary  LUNGS: clear to auscultation and percussion with normal breathing effort HEART: regular rate & rhythm and no murmurs and no lower extremity edema ABDOMEN:abdomen soft, non-tender and normal bowel sounds Musculoskeletal:no cyanosis of digits and no clubbing  NEURO: alert & oriented x 3 with fluent speech, no focal motor/sensory deficits  Physical Exam   LABORATORY DATA:  I have reviewed the data as listed    Latest Ref Rng & Units 08/06/2024   10:31 AM 04/09/2024   10:05 AM 01/16/2024    7:43 AM  CBC  WBC 4.0 - 10.5 K/uL 5.2  5.3  5.4   Hemoglobin 12.0 - 15.0 g/dL 86.1  86.8  86.4   Hematocrit 36.0 - 46.0 % 39.5  39.0  40.8   Platelets 150 - 400 K/uL 259  260  252         Latest Ref Rng & Units 08/06/2024   10:31 AM 04/09/2024   10:05 AM 01/16/2024    7:43 AM  CMP  Glucose 70 - 99 mg/dL 893  92  899    BUN 6 - 20 mg/dL 16  17  14  Creatinine 0.44 - 1.00 mg/dL 9.30  9.31  9.25   Sodium 135 - 145 mmol/L 140  141  145   Potassium 3.5 - 5.1 mmol/L 4.2  4.2  4.4   Chloride 98 - 111 mmol/L 105  104  104   CO2 22 - 32 mmol/L 29  19  22    Calcium  8.9 - 10.3 mg/dL 9.8  9.5  9.5   Total Protein 6.5 - 8.1 g/dL 7.2  6.6  6.4   Total Bilirubin 0.0 - 1.2 mg/dL 0.8  0.6  0.2   Alkaline Phos 38 - 126 U/L 70  90  135   AST 15 - 41 U/L 38  37  27   ALT 0 - 44 U/L 55  51  31       RADIOGRAPHIC STUDIES: I have personally reviewed the radiological images as listed and agreed with the findings in the report. No results found.    No orders of the defined types were placed in this encounter.  All questions were answered. The patient knows to call the clinic with any problems, questions or concerns. No barriers to learning was detected. The total time spent in the appointment was 25 minutes, including review of chart and various tests results, discussions about plan of care and coordination of care plan     Onita Mattock, MD 08/06/2024

## 2024-08-06 NOTE — Progress Notes (Signed)
 Verbal order w/readback order from Dr. Lanny for Oncodetect to be drawn on 08/10/2024. Order placed in [employer] and in [employer] portal.  Oncodetect kit and requisition given to [employer].

## 2024-08-10 ENCOUNTER — Ambulatory Visit
Admission: RE | Admit: 2024-08-10 | Discharge: 2024-08-10 | Disposition: A | Discharge status: Home / Self Care | Source: Ambulatory Visit | Attending: Nurse Practitioner | Admitting: Nurse Practitioner

## 2024-08-10 ENCOUNTER — Inpatient Hospital Stay

## 2024-08-10 DIAGNOSIS — C50919 Malignant neoplasm of unspecified site of unspecified female breast: Secondary | ICD-10-CM

## 2024-08-10 DIAGNOSIS — C50111 Malignant neoplasm of central portion of right female breast: Secondary | ICD-10-CM

## 2024-08-10 DIAGNOSIS — Z1231 Encounter for screening mammogram for malignant neoplasm of breast: Secondary | ICD-10-CM | POA: Diagnosis not present

## 2024-08-10 LAB — MISCELLANEOUS TEST

## 2024-08-15 ENCOUNTER — Other Ambulatory Visit: Payer: Self-pay | Admitting: Medical Genetics

## 2024-08-15 DIAGNOSIS — Z006 Encounter for examination for normal comparison and control in clinical research program: Secondary | ICD-10-CM

## 2024-08-21 ENCOUNTER — Ambulatory Visit: Admitting: Family Medicine

## 2024-09-02 ENCOUNTER — Other Ambulatory Visit: Payer: Self-pay | Admitting: Family Medicine

## 2024-09-02 MED ORDER — LISDEXAMFETAMINE DIMESYLATE 40 MG PO CAPS
40.0000 mg | ORAL_CAPSULE | ORAL | 0 refills | Status: DC
  Filled 2024-09-02: qty 30, 30d supply, fill #0

## 2024-09-03 ENCOUNTER — Other Ambulatory Visit (HOSPITAL_BASED_OUTPATIENT_CLINIC_OR_DEPARTMENT_OTHER): Payer: Self-pay

## 2024-09-05 DIAGNOSIS — C50411 Malignant neoplasm of upper-outer quadrant of right female breast: Secondary | ICD-10-CM | POA: Diagnosis not present

## 2024-09-11 ENCOUNTER — Ambulatory Visit: Admitting: Family Medicine

## 2024-09-12 ENCOUNTER — Other Ambulatory Visit (HOSPITAL_BASED_OUTPATIENT_CLINIC_OR_DEPARTMENT_OTHER): Payer: Self-pay

## 2024-09-12 ENCOUNTER — Encounter: Payer: Self-pay | Admitting: Family Medicine

## 2024-09-12 ENCOUNTER — Other Ambulatory Visit: Payer: Self-pay

## 2024-09-12 ENCOUNTER — Other Ambulatory Visit: Payer: Self-pay | Admitting: Family Medicine

## 2024-09-12 ENCOUNTER — Ambulatory Visit (INDEPENDENT_AMBULATORY_CARE_PROVIDER_SITE_OTHER): Admitting: Family Medicine

## 2024-09-12 VITALS — BP 126/84 | HR 85 | Temp 98.1°F | Ht 64.5 in | Wt 165.0 lb

## 2024-09-12 DIAGNOSIS — E782 Mixed hyperlipidemia: Secondary | ICD-10-CM | POA: Diagnosis not present

## 2024-09-12 DIAGNOSIS — R7303 Prediabetes: Secondary | ICD-10-CM

## 2024-09-12 DIAGNOSIS — F988 Other specified behavioral and emotional disorders with onset usually occurring in childhood and adolescence: Secondary | ICD-10-CM | POA: Diagnosis not present

## 2024-09-12 DIAGNOSIS — G44039 Episodic paroxysmal hemicrania, not intractable: Secondary | ICD-10-CM | POA: Diagnosis not present

## 2024-09-12 DIAGNOSIS — R002 Palpitations: Secondary | ICD-10-CM | POA: Diagnosis not present

## 2024-09-12 DIAGNOSIS — C50111 Malignant neoplasm of central portion of right female breast: Secondary | ICD-10-CM

## 2024-09-12 DIAGNOSIS — Z1211 Encounter for screening for malignant neoplasm of colon: Secondary | ICD-10-CM | POA: Diagnosis not present

## 2024-09-12 LAB — COMPREHENSIVE METABOLIC PANEL WITH GFR
ALT: 67 IU/L — ABNORMAL HIGH (ref 0–32)
AST: 47 IU/L — ABNORMAL HIGH (ref 0–40)
Albumin: 5 g/dL — ABNORMAL HIGH (ref 3.9–4.9)
Alkaline Phosphatase: 91 IU/L (ref 41–116)
BUN/Creatinine Ratio: 23 (ref 9–23)
BUN: 16 mg/dL (ref 6–24)
Bilirubin Total: 0.9 mg/dL (ref 0.0–1.2)
CO2: 24 mmol/L (ref 20–29)
Calcium: 9.6 mg/dL (ref 8.7–10.2)
Chloride: 101 mmol/L (ref 96–106)
Creatinine, Ser: 0.7 mg/dL (ref 0.57–1.00)
Globulin, Total: 2.1 g/dL (ref 1.5–4.5)
Glucose: 123 mg/dL — ABNORMAL HIGH (ref 70–99)
Potassium: 4.2 mmol/L (ref 3.5–5.2)
Sodium: 142 mmol/L (ref 134–144)
Total Protein: 7.1 g/dL (ref 6.0–8.5)
eGFR: 109 mL/min/1.73 (ref >59)

## 2024-09-12 LAB — CBC WITH DIFFERENTIAL/PLATELET
Basophils Absolute: 0 x10E3/uL (ref 0.0–0.2)
Basos: 1 %
EOS (ABSOLUTE): 0.1 x10E3/uL (ref 0.0–0.4)
Eos: 1 %
Hematocrit: 43.9 % (ref 34.0–46.6)
Hemoglobin: 14.8 g/dL (ref 11.1–15.9)
Immature Grans (Abs): 0 x10E3/uL (ref 0.0–0.1)
Immature Granulocytes: 0 %
Lymphocytes Absolute: 1.5 x10E3/uL (ref 0.7–3.1)
Lymphs: 31 %
MCH: 29.2 pg (ref 26.6–33.0)
MCHC: 33.7 g/dL (ref 31.5–35.7)
MCV: 87 fL (ref 79–97)
Monocytes Absolute: 0.3 x10E3/uL (ref 0.1–0.9)
Monocytes: 7 %
Neutrophils Absolute: 3 x10E3/uL (ref 1.4–7.0)
Neutrophils: 60 %
Platelets: 295 x10E3/uL (ref 150–450)
RBC: 5.06 x10E6/uL (ref 3.77–5.28)
RDW: 13 % (ref 11.7–15.4)
WBC: 4.9 x10E3/uL (ref 3.4–10.8)

## 2024-09-12 LAB — HEMOGLOBIN A1C
Est. average glucose Bld gHb Est-mCnc: 123 mg/dL
Hgb A1c MFr Bld: 5.9 % — ABNORMAL HIGH (ref 4.8–5.6)

## 2024-09-12 LAB — TSH: TSH: 2.1 u[IU]/mL (ref 0.450–4.500)

## 2024-09-12 LAB — LIPID PANEL
Chol/HDL Ratio: 3.3 ratio (ref 0.0–4.4)
Cholesterol, Total: 149 mg/dL (ref 100–199)
HDL: 45 mg/dL (ref >39)
LDL Chol Calc (NIH): 82 mg/dL (ref 0–99)
Triglycerides: 125 mg/dL (ref 0–149)
VLDL Cholesterol Cal: 22 mg/dL (ref 5–40)

## 2024-09-12 MED ORDER — GABAPENTIN 300 MG PO CAPS
300.0000 mg | ORAL_CAPSULE | Freq: Three times a day (TID) | ORAL | 1 refills | Status: AC
  Filled 2024-09-12: qty 270, 90d supply, fill #0

## 2024-09-12 MED ORDER — LISDEXAMFETAMINE DIMESYLATE 40 MG PO CAPS: 40.0000 mg | ORAL_CAPSULE | ORAL | 0 refills | Status: AC

## 2024-09-12 MED ORDER — FLUTICASONE PROPIONATE 50 MCG/ACT NA SUSP
2.0000 | Freq: Every day | NASAL | 0 refills | Status: AC
  Filled 2024-09-12: qty 48, 90d supply, fill #0

## 2024-09-12 MED ORDER — LISDEXAMFETAMINE DIMESYLATE 40 MG PO CAPS
40.0000 mg | ORAL_CAPSULE | ORAL | 0 refills | Status: AC
  Filled 2024-11-16: qty 30, 30d supply, fill #0

## 2024-09-12 MED ORDER — LISDEXAMFETAMINE DIMESYLATE 40 MG PO CAPS
40.0000 mg | ORAL_CAPSULE | ORAL | 0 refills | Status: AC
  Filled 2024-10-05: qty 30, 30d supply, fill #0

## 2024-09-12 MED ORDER — METOPROLOL SUCCINATE ER 25 MG PO TB24
25.0000 mg | ORAL_TABLET | Freq: Every day | ORAL | 1 refills | Status: AC
  Filled 2024-09-12: qty 90, 90d supply, fill #0

## 2024-09-12 MED ORDER — ROSUVASTATIN CALCIUM 5 MG PO TABS
5.0000 mg | ORAL_TABLET | Freq: Every day | ORAL | 1 refills | Status: AC
  Filled 2024-09-12 – 2024-10-12 (×2): qty 90, 90d supply, fill #0

## 2024-09-12 NOTE — Progress Notes (Unsigned)
 Subjective:  Patient ID: Sheri Moore, female    DOB: 29-May-1981  Age: 43 y.o. MRN: [id number]  No chief complaint on file.   HPI: Discussed the use of AI scribe software for clinical note transcription with the patient, who gave verbal consent to proceed.  History of Present Illness        08/06/2024   10:00 AM 01/17/2024    4:19 PM 06/03/2023   10:27 AM 06/02/2022    2:21 PM 02/19/2022   11:37 AM  Depression screen PHQ 2/9  Decreased Interest 0 0 0 0 0  Down, Depressed, Hopeless 0 0 0 0 0  PHQ - 2 Score 0 0 0 0 0  Altered sleeping  2 0 0 3  Tired, decreased energy  0 1 1 3   Change in appetite  0  0 0  Feeling bad or failure about yourself   0 0 0 0  Trouble concentrating  1 2 1 3   Moving slowly or fidgety/restless  0 0 0 0  Suicidal thoughts  0 0 0 0  PHQ-9 Score  3  3  2  9    Difficult doing work/chores  Not difficult at all  Not difficult at all Very difficult     Data saved with a previous flowsheet row definition        06/03/2023   10:26 AM  Fall Risk   Falls in the past year? 0  Number falls in past yr: 0  Injury with Fall? 0   Risk for fall due to : No Fall Risks  Follow up Falls evaluation completed     Data saved with a previous flowsheet row definition    Patient Care Team: Sherre Clapper, MD as PCP - General (Family Medicine) Lanny Callander, MD as Consulting Physician (Hematology) Aron Shoulders, MD as Consulting Physician (General Surgery) Dewey Rush, MD as Consulting Physician (Radiation Oncology)   Review of Systems  Current Outpatient Medications on File Prior to Visit  Medication Sig Dispense Refill   fluticasone  (FLONASE ) 50 MCG/ACT nasal spray Place 2 sprays into both nostrils daily. 42 g 3   gabapentin  (NEURONTIN ) 300 MG capsule Take 1 capsule (300 mg total) by mouth 3 (three) times daily. 270 capsule 1   letrozole  (FEMARA ) 2.5 MG tablet Take 1 tablet (2.5 mg total) by mouth daily. 90 tablet 1   lisdexamfetamine (VYVANSE ) 40 MG capsule Take  1 capsule (40 mg total) by mouth every morning. 30 capsule 0   loratadine (CLARITIN) 10 MG tablet Take 10 mg by mouth daily.     metoprolol  succinate (TOPROL -XL) 25 MG 24 hr tablet Take 1 tablet (25 mg total) by mouth daily. 90 tablet 1   phenazopyridine  (PYRIDIUM ) 200 MG tablet Take 1 tablet (200 mg total) by mouth 3 (three) times daily as needed for pain. 20 tablet 0   rosuvastatin  (CRESTOR ) 5 MG tablet Take 1 tablet (5 mg total) by mouth daily. 90 tablet 1   No current facility-administered medications on file prior to visit.   Past Medical History:  Diagnosis Date   Anxiety    Breast cancer (HCC) 07/06/21   Right Breast, first seen on mammogram done 07/06/21   Depression    Family history of breast cancer    Family history of melanoma    Family history of uterine cancer    Fibroid    Gastroschisis    Left arm numbness 10/17/2018   Left arm weakness 10/17/2018   Past Surgical History:  Procedure Laterality Date   ABDOMINAL HYSTERECTOMY     supracervical hysterectomy   ABDOMINAL SURGERY     as a newborn, gastrogheschesis   BREAST LUMPECTOMY Right 09/10/2021   BREAST LUMPECTOMY WITH RADIOACTIVE SEED AND SENTINEL LYMPH NODE BIOPSY Right 09/10/2021   Procedure: RIGHT BREAST LUMPECTOMY WITH RADIOACTIVE SEED AND SENTINEL LYMPH NODE BIOPSY;  Surgeon: Aron Shoulders, MD;  Location: [facility] OR;  Service: General;  Laterality: Right;   OOPHORECTOMY      Family History  Problem Relation Age of Onset   Uterine cancer Mother 38   Diabetes Mother    Hypertension Mother    Thyroid  disease Mother    Obesity Mother    Subarachnoid hemorrhage Father    Melanoma Father 26   Diabetes Father    Diabetes Sister    Obesity Sister    Diabetes Maternal Grandmother    Hypertension Maternal Grandmother    Melanoma Paternal Grandfather 74   Diabetes Paternal Grandfather    Breast cancer Other 73       MGMs sister   Breast cancer Other 81       MGF's sister   Breast cancer Other        dx < 50;  PGMs sister   Social History   Socioeconomic History   Marital status: Married    Spouse name: Not on file   Number of children: 1   Years of education: College   Highest education level: Associate degree: occupational, scientist, product/process development, or vocational program  Occupational History   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, Fashion/clothing  Tobacco Use   Smoking status: Former    Current packs/day: 0.00    Average packs/day: 0.3 packs/day for 3.0 years (0.8 ttl pk-yrs)    Types: Cigarettes    Start date: 10/11/1998    Quit date: 10/11/2001    Years since quitting: 22.9   Smokeless tobacco: Former  Building Services Engineer status: Never Used  Substance and Sexual Activity   Alcohol use: Not Currently    Comment: occ   Drug use: No   Sexual activity: Yes    Partners: Male    Birth control/protection: Surgical    Comment: supracervical hysterectomy, menarche 43yo, sexual debut 43yo  Other Topics Concern   Not on file  Social History Narrative   Lives with husband and son   Caffeine use: rarely   Right handed    Social Drivers of Health   Financial Resource Strain: Low Risk  (10/07/2023)   Overall Financial Resource Strain (CARDIA)    Difficulty of Paying Living Expenses: Not hard at all  Food Insecurity: No Food Insecurity (08/06/2024)   Hunger Vital Sign    Worried About Running Out of Food in the Last Year: Never true    Ran Out of Food in the Last Year: Never true  Transportation Needs: No Transportation Needs (08/06/2024)   PRAPARE - Administrator, Civil Service (Medical): No    Lack of Transportation (Non-Medical): No  Physical Activity: Insufficiently Active (10/07/2023)   Exercise Vital Sign    Days of Exercise per Week: 3 days    Minutes of Exercise per Session: 20 min  Stress: No Stress Concern Present (10/07/2023)   [employer] of Occupational Health - Occupational Stress Questionnaire    Feeling of Stress : Only a little  Social Connections: Moderately Integrated  (10/07/2023)   Social Connection and Isolation Panel    Frequency of Communication with Friends and Family: More than three times a  week    Frequency of Social Gatherings with Friends and Family: Three times a week    Attends Religious Services: More than 4 times per year    Active Member of Clubs or Organizations: No    Attends Banker Meetings: Not on file    Marital Status: Married    Objective:  There were no vitals taken for this visit.     08/06/2024   10:00 AM 04/09/2024    9:56 AM 01/17/2024   12:05 PM  BP/Weight  Systolic BP 130 110 117  Diastolic BP 78 80 74  Wt. (Lbs) 166.6 164 169  BMI 28.16 kg/m2 27.72 kg/m2 28.56 kg/m2    Physical Exam  {Perform Simple Foot Exam  Perform Detailed exam:1} {Insert foot Exam (Optional):30965}   Lab Results  Component Value Date   WBC 5.2 08/06/2024   HGB 13.8 08/06/2024   HCT 39.5 08/06/2024   PLT 259 08/06/2024   GLUCOSE 106 (H) 08/06/2024   CHOL 137 01/16/2024   TRIG 97 01/16/2024   HDL 49 01/16/2024   LDLCALC 70 01/16/2024   ALT 55 (H) 08/06/2024   AST 38 08/06/2024   NA 140 08/06/2024   K 4.2 08/06/2024   CL 105 08/06/2024   CREATININE 0.69 08/06/2024   BUN 16 08/06/2024   CO2 29 08/06/2024   TSH 2.690 05/25/2023   HGBA1C 5.7 (H) 01/16/2024    Results for orders placed or performed in visit on 08/10/24  Miscellaneous test (send-out)   Collection Time: 08/10/24  1:30 PM  Result Value Ref Range   Miscellaneous Test ONCODETECT    Miscellaneous Test Results Collected by Laboratory   .  Assessment & Plan:   Assessment & Plan    There is no height or weight on file to calculate BMI.  Assessment and Plan Assessment & Plan      No orders of the defined types were placed in this encounter.   No orders of the defined types were placed in this encounter.      Follow-up: No follow-ups on file.  An After Visit Summary was printed and given to the patient.  Abigail Free, MD [facility] [phone]

## 2024-09-12 NOTE — Progress Notes (Signed)
 Subjective:  Patient ID: Sheri Moore, female    DOB: March 19, 1981  Age: 43 y.o. MRN: [id number]  Chief Complaint  Patient presents with   Medical Management of Chronic Issues   Discussed the use of AI scribe software for clinical note transcription with the patient, who gave verbal consent to proceed.  History of Present Illness The patient is a 43 year old who presents for medication management and routine follow-up.  Attention-deficit/hyperactivity disorder (adhd) - Vyvanse  taken as prescribed with effective symptom control.  Hyperlipidemia - Rosuvastatin  5 mg once daily.  Hypertension - Metoprolol  XL 25 mg once daily.  Chronic neuropathic eye pain - Gabapentin  taken at night for neuropathy, specifically for eye pain, with effective symptom relief.  Allergic rhinitis - Loratadine used for allergy management. - Flonase  used intermittently, not on a regular basis. - Dry nose attributed to dry heat.  Menopausal symptoms/endocrine therapy - Letrozole  taken for approximately ten years.  Gastrointestinal and constitutional symptoms - No gastrointestinal symptoms, fevers, chills, sweats, earaches, sore throat, or chest pain.  Mood and neurocognitive symptoms - No anxiety or depression. - MS-like symptoms and fatigue have improved with better nutrition.  Nutritional status and diet - Diet described as healthy. - Frequently skips lunch and eats small portions. - Protein shakes consumed for breakfast.         09/12/2024    3:58 PM 08/06/2024   10:00 AM 01/17/2024    4:19 PM 06/03/2023   10:27 AM 06/02/2022    2:21 PM  Depression screen PHQ 2/9  Decreased Interest 0 0 0 0 0  Down, Depressed, Hopeless 0 0 0 0 0  PHQ - 2 Score 0 0 0 0 0  Altered sleeping 1  2 0 0  Tired, decreased energy 0  0 1 1  Change in appetite 0  0  0  Feeling bad or failure about yourself  0  0 0 0  Trouble concentrating 0  1 2 1   Moving slowly or fidgety/restless 0  0 0 0  Suicidal  thoughts 0  0 0 0  PHQ-9 Score 1  3  3  2    Difficult doing work/chores Not difficult at all  Not difficult at all  Not difficult at all     Data saved with a previous flowsheet row definition        06/03/2023   10:26 AM  Fall Risk   Falls in the past year? 0  Number falls in past yr: 0  Injury with Fall? 0   Risk for fall due to : No Fall Risks  Follow up Falls evaluation completed     Data saved with a previous flowsheet row definition    Patient Care Team: Sherre Clapper, MD as PCP - General (Family Medicine) Lanny Callander, MD as Consulting Physician (Hematology) Aron Shoulders, MD as Consulting Physician (General Surgery) Dewey Rush, MD as Consulting Physician (Radiation Oncology)     Current Outpatient Medications on File Prior to Visit  Medication Sig Dispense Refill   letrozole  (FEMARA ) 2.5 MG tablet Take 1 tablet (2.5 mg total) by mouth daily. 90 tablet 1   loratadine (CLARITIN) 10 MG tablet Take 10 mg by mouth daily.     No current facility-administered medications on file prior to visit.   Past Medical History:  Diagnosis Date   Anxiety    Breast cancer (HCC) 07/06/21   Right Breast, first seen on mammogram done 07/06/21   Depression    Family history of breast cancer  Family history of melanoma    Family history of uterine cancer    Fibroid    Gastroschisis    Left arm numbness 10/17/2018   Left arm weakness 10/17/2018   Past Surgical History:  Procedure Laterality Date   ABDOMINAL HYSTERECTOMY     supracervical hysterectomy   ABDOMINAL SURGERY     as a newborn, gastrogheschesis   BREAST LUMPECTOMY Right 09/10/2021   BREAST LUMPECTOMY WITH RADIOACTIVE SEED AND SENTINEL LYMPH NODE BIOPSY Right 09/10/2021   Procedure: RIGHT BREAST LUMPECTOMY WITH RADIOACTIVE SEED AND SENTINEL LYMPH NODE BIOPSY;  Surgeon: Aron Shoulders, MD;  Location: [facility] OR;  Service: General;  Laterality: Right;   OOPHORECTOMY      Family History  Problem Relation Age of Onset   Uterine  cancer Mother 82   Diabetes Mother    Hypertension Mother    Thyroid  disease Mother    Obesity Mother    Subarachnoid hemorrhage Father    Melanoma Father 1   Diabetes Father    Diabetes Sister    Obesity Sister    Diabetes Maternal Grandmother    Hypertension Maternal Grandmother    Melanoma Paternal Grandfather 36   Diabetes Paternal Grandfather    Breast cancer Other 54       MGMs sister   Breast cancer Other 63       MGF's sister   Breast cancer Other        dx < 50; PGMs sister   Social History   Socioeconomic History   Marital status: Married    Spouse name: Not on file   Number of children: 1   Years of education: College   Highest education level: Associate degree: occupational, scientist, product/process development, or vocational program  Occupational History   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, Fashion/clothing  Tobacco Use   Smoking status: Former    Current packs/day: 0.00    Average packs/day: 0.3 packs/day for 3.0 years (0.8 ttl pk-yrs)    Types: Cigarettes    Start date: 10/11/1998    Quit date: 10/11/2001    Years since quitting: 22.9   Smokeless tobacco: Former  Building Services Engineer status: Never Used  Substance and Sexual Activity   Alcohol use: Not Currently    Comment: occ   Drug use: No   Sexual activity: Yes    Partners: Male    Birth control/protection: Surgical    Comment: supracervical hysterectomy, menarche 43yo, sexual debut 43yo  Other Topics Concern   Not on file  Social History Narrative   Lives with husband and son   Caffeine use: rarely   Right handed    Social Drivers of Health   Financial Resource Strain: Low Risk  (09/12/2024)   Overall Financial Resource Strain (CARDIA)    Difficulty of Paying Living Expenses: Not hard at all  Food Insecurity: No Food Insecurity (08/06/2024)   Hunger Vital Sign    Worried About Running Out of Food in the Last Year: Never true    Ran Out of Food in the Last Year: Never true  Transportation Needs: No Transportation Needs (08/06/2024)    PRAPARE - Administrator, Civil Service (Medical): No    Lack of Transportation (Non-Medical): No  Physical Activity: Insufficiently Active (09/12/2024)   Exercise Vital Sign    Days of Exercise per Week: 3 days    Minutes of Exercise per Session: 20 min  Stress: No Stress Concern Present (09/12/2024)   [employer] of Occupational Health - Occupational Stress  Questionnaire    Feeling of Stress: Not at all  Social Connections: Moderately Integrated (09/12/2024)   Social Connection and Isolation Panel    Frequency of Communication with Friends and Family: More than three times a week    Frequency of Social Gatherings with Friends and Family: More than three times a week    Attends Religious Services: More than 4 times per year    Active Member of [employer] or Organizations: No    Attends Banker Meetings: Never    Marital Status: Married    Objective:  BP 126/84   Pulse 85   Temp 98.1 F (36.7 C)   Ht 5' 4.5 (1.638 m)   Wt 165 lb (74.8 kg)   SpO2 95%   BMI 27.88 kg/m      09/12/2024    3:53 PM 08/06/2024   10:00 AM 04/09/2024    9:56 AM  BP/Weight  Systolic BP 126 130 110  Diastolic BP 84 78 80  Wt. (Lbs) 165 166.6 164  BMI 27.88 kg/m2 28.16 kg/m2 27.72 kg/m2    Physical Exam Vitals reviewed.  Constitutional:      Appearance: Normal appearance. She is normal weight.  Neck:     Vascular: No carotid bruit.  Cardiovascular:     Rate and Rhythm: Normal rate and regular rhythm.     Heart sounds: Normal heart sounds.  Pulmonary:     Effort: Pulmonary effort is normal. No respiratory distress.     Breath sounds: Normal breath sounds.  Abdominal:     General: Abdomen is flat. Bowel sounds are normal.     Palpations: Abdomen is soft.     Tenderness: There is no abdominal tenderness.  Neurological:     Mental Status: She is alert and oriented to person, place, and time.  Psychiatric:        Mood and Affect: Mood normal.        Behavior:  Behavior normal.         Lab Results  Component Value Date   WBC 4.9 09/12/2024   HGB 14.8 09/12/2024   HCT 43.9 09/12/2024   PLT 295 09/12/2024   GLUCOSE 123 (H) 09/12/2024   CHOL 149 09/12/2024   TRIG 125 09/12/2024   HDL 45 09/12/2024   LDLCALC 82 09/12/2024   ALT 67 (H) 09/12/2024   AST 47 (H) 09/12/2024   NA 142 09/12/2024   K 4.2 09/12/2024   CL 101 09/12/2024   CREATININE 0.70 09/12/2024   BUN 16 09/12/2024   CO2 24 09/12/2024   TSH 2.100 09/12/2024   HGBA1C 5.9 (H) 09/12/2024    Results for orders placed or performed in visit on 09/12/24  Comprehensive metabolic panel with GFR   Collection Time: 09/12/24  8:14 AM  Result Value Ref Range   Glucose 123 (H) 70 - 99 mg/dL   BUN 16 6 - 24 mg/dL   Creatinine, Ser 9.29 0.57 - 1.00 mg/dL   eGFR 890 >40 fO/fpw/8.26   BUN/Creatinine Ratio 23 9 - 23   Sodium 142 134 - 144 mmol/L   Potassium 4.2 3.5 - 5.2 mmol/L   Chloride 101 96 - 106 mmol/L   CO2 24 20 - 29 mmol/L   Calcium  9.6 8.7 - 10.2 mg/dL   Total Protein 7.1 6.0 - 8.5 g/dL   Albumin 5.0 (H) 3.9 - 4.9 g/dL   Globulin, Total 2.1 1.5 - 4.5 g/dL   Bilirubin Total 0.9 0.0 - 1.2 mg/dL   Alkaline  Phosphatase 91 41 - 116 IU/L   AST 47 (H) 0 - 40 IU/L   ALT 67 (H) 0 - 32 IU/L  CBC with Differential/Platelet   Collection Time: 09/12/24  8:14 AM  Result Value Ref Range   WBC 4.9 3.4 - 10.8 x10E3/uL   RBC 5.06 3.77 - 5.28 x10E6/uL   Hemoglobin 14.8 11.1 - 15.9 g/dL   Hematocrit 56.0 65.9 - 46.6 %   MCV 87 79 - 97 fL   MCH 29.2 26.6 - 33.0 pg   MCHC 33.7 31.5 - 35.7 g/dL   RDW 86.9 88.2 - 84.5 %   Platelets 295 150 - 450 x10E3/uL   Neutrophils 60 Not Estab. %   Lymphs 31 Not Estab. %   Monocytes 7 Not Estab. %   Eos 1 Not Estab. %   Basos 1 Not Estab. %   Neutrophils Absolute 3.0 1.4 - 7.0 x10E3/uL   Lymphocytes Absolute 1.5 0.7 - 3.1 x10E3/uL   Monocytes Absolute 0.3 0.1 - 0.9 x10E3/uL   EOS (ABSOLUTE) 0.1 0.0 - 0.4 x10E3/uL   Basophils Absolute 0.0 0.0  - 0.2 x10E3/uL   Immature Granulocytes 0 Not Estab. %   Immature Grans (Abs) 0.0 0.0 - 0.1 x10E3/uL  Lipid panel   Collection Time: 09/12/24  8:14 AM  Result Value Ref Range   Cholesterol, Total 149 100 - 199 mg/dL   Triglycerides 874 0 - 149 mg/dL   HDL 45 >60 mg/dL   VLDL Cholesterol Cal 22 5 - 40 mg/dL   LDL Chol Calc (NIH) 82 0 - 99 mg/dL   Chol/HDL Ratio 3.3 0.0 - 4.4 ratio  Hemoglobin A1c   Collection Time: 09/12/24  8:14 AM  Result Value Ref Range   Hgb A1c MFr Bld 5.9 (H) 4.8 - 5.6 %   Est. average glucose Bld gHb Est-mCnc 123 mg/dL  TSH   Collection Time: 09/12/24  8:14 AM  Result Value Ref Range   TSH 2.100 0.450 - 4.500 uIU/mL  .  Assessment & Plan:   Assessment & Plan Mixed hyperlipidemia Managed with rosuvastatin  5 mg daily. Previous cholesterol levels were good and improved. - Continue rosuvastatin  5 mg daily. Orders:   rosuvastatin  (CRESTOR ) 5 MG tablet; Take 1 tablet (5 mg total) by mouth daily.  Prediabetes Hemoglobin A1c 5.7%, 3 month avg of blood sugars, is in prediabetic range.  In order to prevent progression to diabetes, recommend low carb diet and regular exercise      Palpitations The current medical regimen is effective;  continue present plan and medications.  Orders:   metoprolol  succinate (TOPROL -XL) 25 MG 24 hr tablet; Take 1 tablet (25 mg total) by mouth daily.   Episodic paroxysmal hemicrania, not intractable Managed with gabapentin , taken once daily at night. The medication is effective. - Continue gabapentin  once daily at night. Orders:   gabapentin  (NEURONTIN ) 300 MG capsule; Take 1 capsule (300 mg total) by mouth 3 (three) times daily.  Attention deficit disorder (ADD) without hyperactivity ADHD is well-managed with Vyvanse . - Continue Vyvanse  with refills scheduled for November 22nd, December 22nd, January 23rd, February 23rd, March 23rd, and May 23rd. Orders:   lisdexamfetamine (VYVANSE ) 40 MG capsule; Take 1 capsule (40 mg  total) by mouth every morning.   lisdexamfetamine (VYVANSE ) 40 MG capsule; Take 1 capsule (40 mg total) by mouth every morning.   lisdexamfetamine (VYVANSE ) 40 MG capsule; Take 1 capsule (40 mg total) by mouth every morning.   lisdexamfetamine (VYVANSE ) 40 MG capsule; Take 1  capsule (40 mg total) by mouth every morning.   lisdexamfetamine (VYVANSE ) 40 MG capsule; Take 1 capsule (40 mg total) by mouth every morning.   lisdexamfetamine (VYVANSE ) 40 MG capsule; Take 1 capsule (40 mg total) by mouth every morning.  Screen for colon cancer  Orders:   Cologuard  Cancer of central portion of right breast (HCC) Breast cancer is managed with letrozole , with a plan for a 10-year course. - Continue letrozole  as prescribed by the nurse practitioner.      Body mass index is 27.88 kg/m.    Meds ordered this encounter  Medications   fluticasone  (FLONASE ) 50 MCG/ACT nasal spray    Sig: Place 2 sprays into both nostrils daily.    Dispense:  48 g    Refill:  0   gabapentin  (NEURONTIN ) 300 MG capsule    Sig: Take 1 capsule (300 mg total) by mouth 3 (three) times daily.    Dispense:  270 capsule    Refill:  1   metoprolol  succinate (TOPROL -XL) 25 MG 24 hr tablet    Sig: Take 1 tablet (25 mg total) by mouth daily.    Dispense:  90 tablet    Refill:  1   rosuvastatin  (CRESTOR ) 5 MG tablet    Sig: Take 1 tablet (5 mg total) by mouth daily.    Dispense:  90 tablet    Refill:  1   lisdexamfetamine (VYVANSE ) 40 MG capsule    Sig: Take 1 capsule (40 mg total) by mouth every morning.    Dispense:  30 capsule    Refill:  0   lisdexamfetamine (VYVANSE ) 40 MG capsule    Sig: Take 1 capsule (40 mg total) by mouth every morning.    Dispense:  30 capsule    Refill:  0   lisdexamfetamine (VYVANSE ) 40 MG capsule    Sig: Take 1 capsule (40 mg total) by mouth every morning.    Dispense:  30 capsule    Refill:  0   lisdexamfetamine (VYVANSE ) 40 MG capsule    Sig: Take 1 capsule (40 mg total) by mouth  every morning.    Dispense:  30 capsule    Refill:  0   lisdexamfetamine (VYVANSE ) 40 MG capsule    Sig: Take 1 capsule (40 mg total) by mouth every morning.    Dispense:  30 capsule    Refill:  0   lisdexamfetamine (VYVANSE ) 40 MG capsule    Sig: Take 1 capsule (40 mg total) by mouth every morning.    Dispense:  30 capsule    Refill:  0    Orders Placed This Encounter  Procedures   Cologuard       Follow-up: Return in about 6 months (around 03/13/2025) for chronic follow up.  An After Visit Summary was printed and given to the patient.  Abigail Free, MD [facility] [phone]

## 2024-09-12 NOTE — Progress Notes (Signed)
 As per Dr.Feng contacted pt to give results of exact science genetic testing , test result was negative pt voice full understanding and had no other questions at this time.

## 2024-09-13 ENCOUNTER — Ambulatory Visit: Payer: Self-pay | Admitting: Family Medicine

## 2024-09-15 DIAGNOSIS — G44039 Episodic paroxysmal hemicrania, not intractable: Secondary | ICD-10-CM | POA: Insufficient documentation

## 2024-09-16 DIAGNOSIS — Z1211 Encounter for screening for malignant neoplasm of colon: Secondary | ICD-10-CM | POA: Insufficient documentation

## 2024-09-16 NOTE — Assessment & Plan Note (Signed)
 The current medical regimen is effective;  continue present plan and medications.  Orders:   metoprolol  succinate (TOPROL -XL) 25 MG 24 hr tablet; Take 1 tablet (25 mg total) by mouth daily.

## 2024-09-16 NOTE — Assessment & Plan Note (Signed)
 Orders:    Cologuard

## 2024-09-16 NOTE — Assessment & Plan Note (Signed)
 Managed with rosuvastatin  5 mg daily. Previous cholesterol levels were good and improved. - Continue rosuvastatin  5 mg daily. Orders:   rosuvastatin  (CRESTOR ) 5 MG tablet; Take 1 tablet (5 mg total) by mouth daily.

## 2024-09-16 NOTE — Assessment & Plan Note (Signed)
 ADHD is well-managed with Vyvanse . - Continue Vyvanse  with refills scheduled for November 22nd, December 22nd, January 23rd, February 23rd, March 23rd, and May 23rd. Orders:   lisdexamfetamine (VYVANSE ) 40 MG capsule; Take 1 capsule (40 mg total) by mouth every morning.   lisdexamfetamine (VYVANSE ) 40 MG capsule; Take 1 capsule (40 mg total) by mouth every morning.   lisdexamfetamine (VYVANSE ) 40 MG capsule; Take 1 capsule (40 mg total) by mouth every morning.   lisdexamfetamine (VYVANSE ) 40 MG capsule; Take 1 capsule (40 mg total) by mouth every morning.   lisdexamfetamine (VYVANSE ) 40 MG capsule; Take 1 capsule (40 mg total) by mouth every morning.   lisdexamfetamine (VYVANSE ) 40 MG capsule; Take 1 capsule (40 mg total) by mouth every morning.

## 2024-09-16 NOTE — Assessment & Plan Note (Signed)
 Managed with gabapentin , taken once daily at night. The medication is effective. - Continue gabapentin  once daily at night. Orders:   gabapentin  (NEURONTIN ) 300 MG capsule; Take 1 capsule (300 mg total) by mouth 3 (three) times daily.

## 2024-09-16 NOTE — Assessment & Plan Note (Signed)
 Breast cancer is managed with letrozole , with a plan for a 10-year course. - Continue letrozole  as prescribed by the nurse practitioner.

## 2024-09-16 NOTE — Assessment & Plan Note (Signed)
Hemoglobin A1c 5.7%, 3 month avg of blood sugars, is in prediabetic range.  In order to prevent progression to diabetes, recommend low carb diet and regular exercise  

## 2024-09-19 ENCOUNTER — Encounter: Payer: Self-pay | Admitting: Hematology

## 2024-09-19 LAB — GENECONNECT MOLECULAR SCREEN: Genetic Analysis Overall Interpretation: NEGATIVE

## 2024-10-01 DIAGNOSIS — Z1211 Encounter for screening for malignant neoplasm of colon: Secondary | ICD-10-CM | POA: Diagnosis not present

## 2024-10-05 ENCOUNTER — Other Ambulatory Visit (HOSPITAL_BASED_OUTPATIENT_CLINIC_OR_DEPARTMENT_OTHER): Payer: Self-pay

## 2024-10-08 ENCOUNTER — Ambulatory Visit: Payer: Self-pay | Admitting: Family Medicine

## 2024-10-08 LAB — COLOGUARD: COLOGUARD: NEGATIVE

## 2024-10-12 ENCOUNTER — Other Ambulatory Visit (HOSPITAL_BASED_OUTPATIENT_CLINIC_OR_DEPARTMENT_OTHER): Payer: Self-pay

## 2024-10-23 ENCOUNTER — Other Ambulatory Visit (HOSPITAL_BASED_OUTPATIENT_CLINIC_OR_DEPARTMENT_OTHER): Payer: Self-pay

## 2024-11-16 ENCOUNTER — Other Ambulatory Visit (HOSPITAL_BASED_OUTPATIENT_CLINIC_OR_DEPARTMENT_OTHER): Payer: Self-pay

## 2025-02-08 ENCOUNTER — Inpatient Hospital Stay

## 2025-08-06 ENCOUNTER — Inpatient Hospital Stay

## 2025-08-09 ENCOUNTER — Inpatient Hospital Stay: Admitting: Adult Health
